# Patient Record
Sex: Female | Born: 1970 | Race: Black or African American | Hispanic: No | Marital: Married | Smoking: Current every day smoker
Health system: Southern US, Community
[De-identification: ages and names within clinical notes are randomized; demographics above are authoritative.]

## PROBLEM LIST (undated history)

## (undated) ENCOUNTER — Ambulatory Visit (HOSPITAL_COMMUNITY): Discharge status: Absent without leave | Payer: Self-pay

## (undated) DIAGNOSIS — Z8719 Personal history of other diseases of the digestive system: Secondary | ICD-10-CM

## (undated) DIAGNOSIS — F17200 Nicotine dependence, unspecified, uncomplicated: Secondary | ICD-10-CM

## (undated) DIAGNOSIS — Z8711 Personal history of peptic ulcer disease: Secondary | ICD-10-CM

## (undated) DIAGNOSIS — Z87440 Personal history of urinary (tract) infections: Secondary | ICD-10-CM

## (undated) DIAGNOSIS — G43909 Migraine, unspecified, not intractable, without status migrainosus: Secondary | ICD-10-CM

## (undated) DIAGNOSIS — Z8619 Personal history of other infectious and parasitic diseases: Secondary | ICD-10-CM

## (undated) DIAGNOSIS — M1990 Unspecified osteoarthritis, unspecified site: Secondary | ICD-10-CM

## (undated) DIAGNOSIS — Z86018 Personal history of other benign neoplasm: Secondary | ICD-10-CM

## (undated) DIAGNOSIS — G5601 Carpal tunnel syndrome, right upper limb: Secondary | ICD-10-CM

## (undated) DIAGNOSIS — T7840XA Allergy, unspecified, initial encounter: Secondary | ICD-10-CM

## (undated) DIAGNOSIS — Z860101 Personal history of adenomatous and serrated colon polyps: Secondary | ICD-10-CM

## (undated) DIAGNOSIS — Z86010 Personal history of colonic polyps: Secondary | ICD-10-CM

## (undated) DIAGNOSIS — K219 Gastro-esophageal reflux disease without esophagitis: Secondary | ICD-10-CM

## (undated) DIAGNOSIS — N6320 Unspecified lump in the left breast, unspecified quadrant: Secondary | ICD-10-CM

## (undated) DIAGNOSIS — M67431 Ganglion, right wrist: Secondary | ICD-10-CM

## (undated) HISTORY — DX: Personal history of peptic ulcer disease: Z87.11

## (undated) HISTORY — DX: Gastro-esophageal reflux disease without esophagitis: K21.9

## (undated) HISTORY — DX: Migraine, unspecified, not intractable, without status migrainosus: G43.909

## (undated) HISTORY — PX: COLONOSCOPY: SHX174

## (undated) HISTORY — DX: Allergy, unspecified, initial encounter: T78.40XA

## (undated) HISTORY — DX: Personal history of urinary (tract) infections: Z87.440

## (undated) HISTORY — PX: KNEE ARTHROSCOPY: SHX127

## (undated) HISTORY — DX: Nicotine dependence, unspecified, uncomplicated: F17.200

## (undated) HISTORY — DX: Personal history of other diseases of the digestive system: Z87.19

---

## 2004-10-04 HISTORY — PX: VAGINAL HYSTERECTOMY: SUR661

## 2014-07-03 ENCOUNTER — Encounter: Payer: Self-pay | Admitting: Medical

## 2014-07-03 ENCOUNTER — Ambulatory Visit (INDEPENDENT_AMBULATORY_CARE_PROVIDER_SITE_OTHER): Payer: BC Managed Care – PPO | Admitting: Medical

## 2014-07-03 VITALS — BP 92/60 | HR 92 | Temp 97.5°F | Resp 16 | Ht 62.4 in | Wt 173.0 lb

## 2014-07-03 DIAGNOSIS — R300 Dysuria: Secondary | ICD-10-CM

## 2014-07-03 DIAGNOSIS — K921 Melena: Secondary | ICD-10-CM

## 2014-07-03 DIAGNOSIS — R109 Unspecified abdominal pain: Secondary | ICD-10-CM

## 2014-07-03 DIAGNOSIS — F17200 Nicotine dependence, unspecified, uncomplicated: Secondary | ICD-10-CM

## 2014-07-03 DIAGNOSIS — Z862 Personal history of diseases of the blood and blood-forming organs and certain disorders involving the immune mechanism: Secondary | ICD-10-CM

## 2014-07-03 DIAGNOSIS — R198 Other specified symptoms and signs involving the digestive system and abdomen: Secondary | ICD-10-CM

## 2014-07-03 DIAGNOSIS — Z800 Family history of malignant neoplasm of digestive organs: Secondary | ICD-10-CM

## 2014-07-03 DIAGNOSIS — Z87898 Personal history of other specified conditions: Secondary | ICD-10-CM

## 2014-07-03 LAB — POCT URINALYSIS DIPSTICK
BILIRUBIN UA: NEGATIVE
Glucose, UA: NEGATIVE
KETONES UA: NEGATIVE
LEUKOCYTES UA: NEGATIVE
Nitrite, UA: NEGATIVE
Protein, UA: NEGATIVE
Spec Grav, UA: 1.015
Urobilinogen, UA: NEGATIVE
pH, UA: 5

## 2014-07-03 MED ORDER — HYDROCORTISONE ACETATE 25 MG RE SUPP: 25.0000 mg | Freq: Two times a day (BID) | RECTAL | Status: DC

## 2014-07-03 MED ORDER — OMEPRAZOLE 40 MG PO CPDR: 40.0000 mg | DELAYED_RELEASE_CAPSULE | Freq: Every day | ORAL | Status: DC

## 2014-07-03 NOTE — Progress Notes (Addendum)
Subjective: Here as a new patient today.   Moved from [place] recently.  She has family here, daughter, granddaughter.   Here accompanied by husband.     She is here for bowel changes, abdominal pain and blood in stool.   Prior to a month ago had "normal" BMs.  But in the last 2-3 weeks been having alternating hard stool, constipation at times, loose stool at times, and blood in stool about every other BM.   Blood is usually bright red, but at times dark red.   Is mixed in with stool.   This past week loose stools at least TID, but also having some hard to pass stool.  2 days ago had urge to defecate and had just straight blood that came out.   She has been using about 800mg  total daily Ibuprofen the last 2 -3 weeks for knee pains.  She is having lower abdominal pain.  No recent sick contacts, no fever, no recent travel.  No urinary c/o although she has hx/o frequent UTIs.    No back pain.  Denies current or hx/o hemorrhoids . Denies prior blood in stool.  Has had hx/o gastric ulcer, but no prior colonoscopy, EGD, or GI eval.   No nausea or vomiting.   No recent illness.  No recent antibiotic use.   She is not up to date on physical or cancer screens.  She is a smoker.   Review of Systems Constitutional: -fever, -chills, -sweats, -unexpected weight change,-fatigue ENT: -runny nose, -ear pain, -sore throat Cardiology:  -chest pain, -palpitations, -edema Respiratory: -cough, -shortness of breath, -wheezing Gastroenterology:+abdominal pain, -nausea, -vomiting, +diarrhea, +constipation Musculoskeletal: +arthralgias, -myalgias, -joint swelling, -back pain Ophthalmology: -vision changes Urology: +dysuria, -difficulty urinating, -hematuria, -urinary frequency, -urgency Neurology: -headache, -weakness, -tingling, -numbness   Past Medical History  Diagnosis Date  . History of frequent urinary tract infections   . Smoker   . Joint pain   . History of gastric ulcer     clinical diagnosis, no prior  EGD  . Migraines   . Asthma   . Anemia     since at least 2013  . GERD (gastroesophageal reflux disease)   . Allergy     Objective:  Filed Vitals:   07/03/14 1142  BP: 92/60  Pulse: 92  Temp:   Resp:     General appearance: alert, no distress, WD/WN, AA female Oral cavity: MMM, no lesions Neck: supple, no lymphadenopathy, no thyromegaly, no masses Heart: RRR, normal S1, S2, no murmurs Lungs: CTA bilaterally, no wheezes, rhonchi, or rales Abdomen: +bs, soft, tender throughout, non distended, no masses, no hepatomegaly, no splenomegaly Back: nontender Pulses: 2+ symmetric DRE: anus posteriorly with 51mm diameter area of erythema suggestive early hemorrhoid vs abrasion, no external lesions otherwise, palpable internal hemorrhoid, no other abnormality, normal rectal tone, occult negative stool, exam chaperoned by nurse Skin: right buttock along superior gluteal cleft with 8cm linear surgical scar from prior pilonidal cyst surgery   Assessment: Encounter Diagnoses  Name Primary?  . Abdominal pain, unspecified site Yes  . Blood in stool   . Smoker   . Change in bowel movement   . History of ulcer disease   . Dysuria   . Family history of stomach cancer   . History of anemia    Plan: Discussed symptoms, exam, concerns.  Differential is wide.  I suspect bleeding internal hemorrhoids brought on by recent constipation, and possible gastritis from NSAID use.   However, can't rule other worrisome  causes of bleeding.  Orthostatics not significant today.  Begin Omeprazole for GERD, possible ulcer, stop NSAIDs for now, begin proctosol for possible bleeding internal hemorrhoids, labs today, and consider imaging and GI referral.  Can use Tylenol for pain.  If pain worsens or other new symptoms in next 48 hours, call or return.

## 2014-07-04 LAB — COMPREHENSIVE METABOLIC PANEL
ALBUMIN: 4.3 g/dL (ref 3.5–5.2)
ALT: 9 U/L (ref 0–35)
AST: 17 U/L (ref 0–37)
Alkaline Phosphatase: 60 U/L (ref 39–117)
BUN: 10 mg/dL (ref 6–23)
CHLORIDE: 104 meq/L (ref 96–112)
CO2: 25 meq/L (ref 19–32)
CREATININE: 0.84 mg/dL (ref 0.50–1.10)
Calcium: 9.1 mg/dL (ref 8.4–10.5)
Glucose, Bld: 83 mg/dL (ref 70–99)
Potassium: 4.1 mEq/L (ref 3.5–5.3)
SODIUM: 137 meq/L (ref 135–145)
Total Bilirubin: 0.3 mg/dL (ref 0.2–1.2)
Total Protein: 7.5 g/dL (ref 6.0–8.3)

## 2014-07-04 LAB — CBC WITH DIFFERENTIAL/PLATELET
BASOS ABS: 0.1 10*3/uL (ref 0.0–0.1)
Basophils Relative: 1 % (ref 0–1)
Eosinophils Absolute: 0.1 10*3/uL (ref 0.0–0.7)
Eosinophils Relative: 2 % (ref 0–5)
HCT: 41.5 % (ref 36.0–46.0)
Hemoglobin: 14.1 g/dL (ref 12.0–15.0)
LYMPHS PCT: 44 % (ref 12–46)
Lymphs Abs: 3.1 10*3/uL (ref 0.7–4.0)
MCH: 31.3 pg (ref 26.0–34.0)
MCHC: 34 g/dL (ref 30.0–36.0)
MCV: 92.2 fL (ref 78.0–100.0)
Monocytes Absolute: 0.4 10*3/uL (ref 0.1–1.0)
Monocytes Relative: 6 % (ref 3–12)
Neutro Abs: 3.3 10*3/uL (ref 1.7–7.7)
Neutrophils Relative %: 47 % (ref 43–77)
PLATELETS: 247 10*3/uL (ref 150–400)
RBC: 4.5 MIL/uL (ref 3.87–5.11)
RDW: 14.4 % (ref 11.5–15.5)
WBC: 7.1 10*3/uL (ref 4.0–10.5)

## 2014-07-04 LAB — LIPASE: Lipase: 43 U/L (ref 0–75)

## 2014-07-04 LAB — IRON AND TIBC
%SAT: 25 % (ref 20–55)
IRON: 80 ug/dL (ref 42–145)
TIBC: 324 ug/dL (ref 250–470)
UIBC: 244 ug/dL (ref 125–400)

## 2014-07-08 ENCOUNTER — Other Ambulatory Visit: Payer: Self-pay | Admitting: Medical

## 2014-07-08 ENCOUNTER — Telehealth: Payer: Self-pay | Admitting: Family Medicine

## 2014-07-08 MED ORDER — HYDROCORTISONE 2.5 % RE CREA: 1.0000 "application " | TOPICAL_CREAM | Freq: Two times a day (BID) | RECTAL | Status: DC

## 2014-07-08 NOTE — Telephone Encounter (Signed)
Patient is aware of [facility] message. CLS

## 2014-07-08 NOTE — Telephone Encounter (Signed)
Patient states that both of the medication's you prescribe her cost way to much money. One medication was $160 and the other was $80 dollars. Can you recommend something else for her to use.

## 2014-07-08 NOTE — Telephone Encounter (Signed)
Surprisingly both of those medications, although generic were expensive.  After calling pharmacy, lets try this:  I have Dexilant samples once daily for epigastric pain and acid reflux.  She can either use this or 2 OTC Omeprazole daily.  Regarding suppository, all are expensive so have her try Hydrocortisone rectal cream topically 2.5% daily or OTC hydrocortisone topically along with salt water soaks

## 2014-08-22 ENCOUNTER — Emergency Department (HOSPITAL_COMMUNITY)
Admission: EM | Admit: 2014-08-22 | Discharge: 2014-08-22 | Disposition: A | Discharge status: Home / Self Care | Payer: BC Managed Care – PPO | Attending: Emergency Medicine | Admitting: Emergency Medicine

## 2014-08-22 ENCOUNTER — Emergency Department (HOSPITAL_COMMUNITY): Payer: BC Managed Care – PPO

## 2014-08-22 DIAGNOSIS — J45909 Unspecified asthma, uncomplicated: Secondary | ICD-10-CM | POA: Diagnosis not present

## 2014-08-22 DIAGNOSIS — T1490XA Injury, unspecified, initial encounter: Secondary | ICD-10-CM

## 2014-08-22 DIAGNOSIS — Z720 Tobacco use: Secondary | ICD-10-CM | POA: Insufficient documentation

## 2014-08-22 DIAGNOSIS — Z87440 Personal history of urinary (tract) infections: Secondary | ICD-10-CM | POA: Insufficient documentation

## 2014-08-22 DIAGNOSIS — Z7952 Long term (current) use of systemic steroids: Secondary | ICD-10-CM | POA: Diagnosis not present

## 2014-08-22 DIAGNOSIS — Y9389 Activity, other specified: Secondary | ICD-10-CM | POA: Diagnosis not present

## 2014-08-22 DIAGNOSIS — Z8669 Personal history of other diseases of the nervous system and sense organs: Secondary | ICD-10-CM | POA: Insufficient documentation

## 2014-08-22 DIAGNOSIS — M67432 Ganglion, left wrist: Secondary | ICD-10-CM | POA: Diagnosis not present

## 2014-08-22 DIAGNOSIS — Y9289 Other specified places as the place of occurrence of the external cause: Secondary | ICD-10-CM | POA: Diagnosis not present

## 2014-08-22 DIAGNOSIS — M67431 Ganglion, right wrist: Secondary | ICD-10-CM

## 2014-08-22 DIAGNOSIS — K219 Gastro-esophageal reflux disease without esophagitis: Secondary | ICD-10-CM | POA: Insufficient documentation

## 2014-08-22 DIAGNOSIS — Y990 Civilian activity done for income or pay: Secondary | ICD-10-CM | POA: Diagnosis not present

## 2014-08-22 DIAGNOSIS — W208XXA Other cause of strike by thrown, projected or falling object, initial encounter: Secondary | ICD-10-CM | POA: Diagnosis not present

## 2014-08-22 DIAGNOSIS — Z8711 Personal history of peptic ulcer disease: Secondary | ICD-10-CM | POA: Insufficient documentation

## 2014-08-22 DIAGNOSIS — S199XXA Unspecified injury of neck, initial encounter: Secondary | ICD-10-CM | POA: Insufficient documentation

## 2014-08-22 MED ORDER — IBUPROFEN 400 MG PO TABS
400.0000 mg | ORAL_TABLET | Freq: Once | ORAL | Status: AC
  Administered 2014-08-22: 400 mg via ORAL
  Filled 2014-08-22: qty 1

## 2014-08-22 MED ORDER — IBUPROFEN 100 MG/5ML PO SUSP: 10.0000 mg/kg | Freq: Once | ORAL | Status: DC

## 2014-08-22 MED ORDER — TRAMADOL HCL 50 MG PO TABS: 50.0000 mg | ORAL_TABLET | Freq: Four times a day (QID) | ORAL | Status: DC | PRN

## 2014-08-22 NOTE — ED Notes (Signed)
1- c/o right wrist pain with lump on anterior aspect. Also c/o left wrist pain. No deformity to left. 2- c/o pain right trapezius area after having fry basket fall on her at work.

## 2014-08-22 NOTE — Discharge Instructions (Signed)
For pain control you may take:  800mg  of ibuprofen (that is usually 4 over the counter pills)  3 times a day (take with food) and acetaminophen 975mg  (this is 3 over the counter pills) four times a day. Do not drink alcohol or combine with other medications that have acetaminophen as an ingredient (Read the labels!).  For breakthrough pain you may take Tramadol. Do not drink alcohol drive or operate heavy machinery when taking Tramadol.  Do not hesitate to return to the emergency room for any new, worsening or concerning symptoms.  Please obtain primary care using resource guide below. But the minute you were seen in the emergency room and that they will need to obtain records for further outpatient management.    Ganglion Cyst A ganglion cyst is a noncancerous, fluid-filled lump that occurs near joints or tendons. The ganglion cyst grows out of a joint or the lining of a tendon. It most often develops in the hand or wrist but can also develop in the shoulder, elbow, hip, knee, ankle, or foot. The round or oval ganglion can be pea sized or larger than a grape. Increased activity may enlarge the size of the cyst because more fluid starts to build up.  CAUSES  It is not completely known what causes a ganglion cyst to grow. However, it may be related to:  Inflammation or irritation around the joint.  An injury.  Repetitive movements or overuse.  Arthritis. SYMPTOMS  A lump most often appears in the hand or wrist, but can occur in other areas of the body. Generally, the lump is painless without other symptoms. However, sometimes pain can be felt during activity or when pressure is applied to the lump. The lump may even be tender to the touch. Tingling, pain, numbness, or muscle weakness can occur if the ganglion cyst presses on a nerve. Your grip may be weak and you may have less movement in your joints.  DIAGNOSIS  Ganglion cysts are most often diagnosed based on a physical exam, noting where the  cyst is and how it looks. Your caregiver will feel the lump and may shine a light alongside it. If it is a ganglion, a light often shines through it. Your caregiver may order an X-ray, ultrasound, or MRI to rule out other conditions. TREATMENT  Ganglions usually go away on their own without treatment. If pain or other symptoms are involved, treatment may be needed. Treatment is also needed if the ganglion limits your movement or if it gets infected. Treatment options include:  Wearing a wrist or finger brace or splint.  Taking anti-inflammatory medicine.  Draining fluid from the lump with a needle (aspiration).  Injecting a steroid into the joint.  Surgery to remove the ganglion cyst and its stalk that is attached to the joint or tendon. However, ganglion cysts can grow back. HOME CARE INSTRUCTIONS   Do not press on the ganglion, poke it with a needle, or hit it with a heavy object. You may rub the lump gently and often. Sometimes fluid moves out of the cyst.  Only take medicines as directed by your caregiver.  Wear your brace or splint as directed by your caregiver. SEEK MEDICAL CARE IF:   Your ganglion becomes larger or more painful.  You have increased redness, red streaks, or swelling.  You have pus coming from the lump.  You have weakness or numbness in the affected area. MAKE SURE YOU:   Understand these instructions.  Will watch your condition.  Will get help right away if you are not doing well or get worse. Document Released: 09/17/2000 Document Revised: 06/14/2012 Document Reviewed: 11/14/2007 [facility] Patient Information 2015 [place], [place]. This information is not intended to replace advice given to you by your health care provider. Make sure you discuss any questions you have with your health care provider.   Emergency Department Resource Guide 1) Find a Doctor and Pay Out of Pocket Although you won't have to find out who is covered by your insurance plan, it  is a good idea to ask around and get recommendations. You will then need to call the office and see if the doctor you have chosen will accept you as a new patient and what types of options they offer for patients who are self-pay. Some doctors offer discounts or will set up payment plans for their patients who do not have insurance, but you will need to ask so you aren't surprised when you get to your appointment.  2) Contact Your Local Health Department Not all health departments have doctors that can see patients for sick visits, but many do, so it is worth a call to see if yours does. If you don't know where your local health department is, you can check in your phone book. The CDC also has a tool to help you locate your state's health department, and many state websites also have listings of all of their local health departments.  3) Find a [facility] If your illness is not likely to be very severe or complicated, you may want to try a walk in clinic. These are popping up all over the country in pharmacies, drugstores, and shopping centers. They're usually staffed by nurse practitioners or physician assistants that have been trained to treat common illnesses and complaints. They're usually fairly quick and inexpensive. However, if you have serious medical issues or chronic medical problems, these are probably not your best option.  No Primary Care Doctor: - Call Health Connect at  [phone] - they can help you locate a primary care doctor that  accepts your insurance, provides certain services, etc. - Physician Referral Service- [phone]  Chronic Pain Problems: Organization         Address  Phone   Notes  [facility]  [phone] Patients need to be referred by their primary care doctor.   Medication Assistance: Organization         Address  Phone   Notes  [facility] Medication [facility] [address] [place], [place] [address] [phone] --Must be a resident of [facility] -- Must have NO insurance coverage whatsoever (no Medicaid/ Medicare, etc.) -- The pt. MUST have a primary care doctor that directs their care regularly and follows them in the community   MedAssist  [phone]   [employer]  [phone]    Agencies that provide inexpensive medical care: Organization         Address  Phone   Notes  [place]  [phone]   [facility]    [phone]   [facility] [place], [place] [address] [phone]   [place] [address], [place] [phone]   Planned Parenthood    [phone]   [facility]    [phone]   [place] and [place], [place] Phone:  [phone],  Fax:  [phone] Hours of Operation:  9 am - 6 pm, M-F.  Also accepts Medicaid/Medicare and self-pay.  [facility] for [place], [address], [place] Phone: [phone], Fax: [phone]. Hours of Operation:  8:30 am - 5:30 pm, M-F.  Also accepts Medicaid and self-pay.  [facility] [place] [address], [place] Phone: [phone]   [place], [place], [place] [phone], Ext. [phone] Mondays & Thursdays: 7-9 AM.  First 15 patients are seen on a first come, first serve basis.    [place] Providers:  Organization         Address  Phone   Notes  [facility] [address], [place] [phone] Also accepts self-pay patients.  [facility] [address], [place]  [phone]   [place], [address], [place] [phone]   [facility] [address] [place] [phone]   Lucianne Lei [address], [place]   [phone] Only accepts [place] Access [place] patients after they have their name applied to their card.   Self-Pay (no insurance) in [facility]:  Organization         Address  Phone   Notes  Sickle Cell Patients, [facility] [place] [phone]   [facility] [place] [phone]   [facility] [place]  [place], [place], [place] [phone]   [facility]/Dr. Osei-Bonsu  [address], [place] or [address], [place] [phone] Phone number for both [place] and [place] locations is the same.  Urgent Medical and [facility] [address] [place] [phone]   [facility] [address], [place] or [address] [phone]   [facility] [address], [place] [phone], phone; [phone], fax Sees patients 1st and 3rd Saturday of every month.  Must not qualify for public or private insurance (i.e. Medicaid, Medicare, [place] Health Choice, Veterans' Benefits)  Household income should be no more than 200% of the poverty level The clinic cannot treat you if you are pregnant or think you are pregnant  Sexually transmitted diseases are not treated at the clinic.    Dental Care: Organization         Address  Phone  Notes  [facility] Department of [facility] [phone] Accepts children up to age 17 who are enrolled in [place] or [place]; pregnant women with a Medicaid card; and children who have applied for Medicaid or [place] Health Choice, but were declined, whose parents can pay a reduced fee at time of service.  [facility] Department of [facility]  [address], [place] [phone] Accepts children up to age 68 who are enrolled in [place] or [place]; pregnant women with a Medicaid card; and children who have applied for Medicaid or [place] Health Choice, but were declined, whose parents can pay a reduced fee at time of service.  [facility] Adult [facility] Access PRO[facility]  [place] [phone] Patients are seen by appointment only. Walk-ins are not  accepted. [place] will see patients 34 years of age and older. Monday - Tuesday (8am-5pm) Most Wednesdays (8:30-5pm) $30 per visit, cash only  [facility] Adult Dental Access PROGRAM  [address], [facility] [phone] Patients are seen by appointment only. Walk-ins are not accepted. [place] will see patients 25 years of age and older. One Wednesday Evening (Monthly: Volunteer Based).  $30 per visit, cash only  [place]  [phone] for adults; Children under age 24, call Graduate Pediatric Dentistry at [phone]. Children aged 10-14, please call [phone] to request a pediatric application.  Dental services are provided in all areas of dental care including fillings, crowns and bridges, complete and partial dentures, implants, gum treatment, root canals, and extractions. Preventive care is also provided. Treatment is provided to both adults and children. Patients are selected via a lottery and there is often a waiting list.   [facility] [address] [place]  [phone] [email or web address]   [facility] [address] [place], [place] [phone], Ext. [phone] Second and Fourth Thursday of each month, opens at 6:30 AM; Clinic ends at 9 AM.  Patients are seen on a first-come first-served basis, and a limited number are seen during each clinic.   [facility]  [address] Hillard Danker [place], [place] [phone]   Eligibility Requirements You must have lived in [place], [place], or [place] counties for at least the last three months.   You cannot be eligible for state or  federal sponsored [employer], including [employer], [place], or [employer].   You generally cannot be eligible for healthcare insurance through your employer.    How to apply: Eligibility screenings are held every Tuesday and Wednesday afternoon from 1:00 pm until 4:00 pm. You do not need an appointment for the interview!  [facility] [address] [place], [place]   [place]  [place] Department  [place]  [phone]    Behavioral Health Resources in the Community: Intensive Outpatient Programs Organization         Address  Phone  Notes  [place] [facility] [place]. [address] [place], [place] [phone]   [facility] [address] [place], [place]   ADS: Alcohol & Drug Svcs [address] [place], [place]   [place] [address],  [place], [place] or [phone]   Substance Abuse Resources Organization         Address  Phone  Notes  Alcohol and Drug Services  [phone]   [place]  [phone]   [facility]   Chinita Pester  [phone]   Residential & Outpatient Substance Abuse Program  [phone]   Psychological Services Organization         Address  Phone  Notes  [facility]  [place]  [phone]   [place] [address], [place] or [phone]    Mobile Crisis Teams Organization         Address  Phone  Notes  [facility]rapeutic Alternatives, Mobile Crisis Care Unit  [phone]   Assertive Psychotherapeutic Services  [address] [place], [place]   [facility] [address] [place] [phone]    Self-Help/Support Groups Organization  Address  Phone              Notes  [place]. of [place] - variety of support groups  [place] Call for more information  Narcotics Anonymous (NA), [facility] [address], [employer] [place]  2 meetings at this location   Special educational needs teacher         Address  Phone  Notes  [facility] Residential Treatment [place],    [place]  [phone]   [facility]  [address] [place] [address], [place], [place]   [place], [place] [phone] Admissions: 8am-3pm M-F  Incentives Substance [place] [address],    [place], [place] [phone]   [facility] [address] [place], [place], [place]   [facility] [address],  [place], [place]   Insight Programs - Intensive Outpatient [address], [place], [place]   [facility] ([place].,  [place], [place] [phone] or [phone]   [facility] ([facility]) [address], [place], [place] Accepts Medicaid  [facility] [place] [address],  [place] [phone] Substance Abuse/Addiction Treatment   [facility] Organization         Address  Phone  Notes  [facility]  [phone]   Domenic Schwab, PhD [address] Arlis Porta [place], [place]   [phone] or [phone]   Petrey [place], [place] [phone]   [facility] [address] [place], [place] [phone] Insurance/Medicaid/sponsorship through [facility] and Families [address] [place]                                    [place], [place] [phone] [place] [address], [place] [phone]    Dr. Adele Schilder  [phone]   [facility]  Dept. 1) [address] [place] 2) [place] 3)  [place] [address], [place] [phone]  [phone]   [place] [phone] or [phone] (After Hours)

## 2014-08-22 NOTE — ED Provider Notes (Signed)
CSN: [id number]     Arrival date & time 08/22/14  8850 History  This chart was scribed for non-physician practitioner, Monico Blitz, PA-C, working with Quintella Reichert, MD, by Stephania Fragmin, ED Scribe. This patient was seen in room TR05C/TR05C and the patient's care was started at 9:36 AM.   Chief Complaint  Patient presents with  . Neck Injury  . Wrist Pain    Patient is a 43 y.o. female presenting with wrist pain. The history is provided by the patient. No language interpreter was used.  Wrist Pain    HPI Comments: April Hudson is a 43 y.o. female who presents to the Emergency Department complaining of 10/10 right wrist pain that "feels like a knot," 8/10, sore left wrist pain, and pain at the back of her neck that began 2 weeks ago when she had dropped a fry basket on it at work. She tried Tylenol, which didn't help. She can't recall her PCP.    Past Medical History  Diagnosis Date  . History of frequent urinary tract infections   . Smoker   . Joint pain   . History of gastric ulcer     clinical diagnosis, no prior EGD  . Migraines   . Asthma   . Anemia     since at least 2013  . GERD (gastroesophageal reflux disease)   . Allergy    No past surgical history on file. Family History  Problem Relation Age of Onset  . Diabetes Mother   . Hypertension Mother   . Cancer Maternal Uncle     stomach   History  Substance Use Topics  . Smoking status: Current Every Day Smoker  . Smokeless tobacco: Not on file  . Alcohol Use: No   OB History    No data available     Review of Systems  All other systems reviewed and are negative.   A complete 10 system review of systems was obtained and all systems are negative except as noted in the HPI and PMH.    Allergies  Sulfa antibiotics  Home Medications   Prior to Admission medications   Medication Sig Start Date End Date Taking? Authorizing Provider  acetaminophen (TYLENOL) 500 MG tablet Take 1,000 mg by mouth every 8  (eight) hours as needed for moderate pain.   Yes Historical Provider, MD  hydrocortisone (ANUSOL-HC) 2.5 % rectal cream Place 1 application rectally 2 (two) times daily. Patient not taking: Reported on 08/22/2014 07/08/14   Camelia Eng Tysinger, PA-C  hydrocortisone (ANUSOL-HC) 25 MG suppository Place 1 suppository (25 mg total) rectally 2 (two) times daily. Patient not taking: Reported on 08/22/2014 07/03/14   Camelia Eng Tysinger, PA-C  omeprazole (PRILOSEC) 40 MG capsule Take 1 capsule (40 mg total) by mouth daily. Patient not taking: Reported on 08/22/2014 07/03/14   Camelia Eng Tysinger, PA-C  traMADol (ULTRAM) 50 MG tablet Take 1 tablet (50 mg total) by mouth every 6 (six) hours as needed. 08/22/14   Laurajean Hosek, PA-C   BP 101/74 mmHg  Pulse 78  Temp(Src) 98.4 F (36.9 C) (Oral)  Resp 20  SpO2 100% Physical Exam  Constitutional: She is oriented to person, place, and time. She appears well-developed and well-nourished. No distress.  HENT:  Head: Normocephalic and atraumatic.  Mouth/Throat: Oropharynx is clear and moist.  Eyes: Conjunctivae and EOM are normal.  Neck: Normal range of motion. Neck supple.  Positive lower midline tenderness palpation.  tenderness to palpation or step-offs appreciated. Patient has full range  of motion without pain.   Cardiovascular: Normal rate and intact distal pulses.   Pulmonary/Chest: Effort normal and breath sounds normal. No stridor.  Abdominal: Soft. Bowel sounds are normal.  Musculoskeletal: Normal range of motion.  Right volar subcutaneous nodule minimally mobile, diffusely tender to palpation. No overlying skin changes. Full range of motion to wrist, distally neurovascular intact.  Neurological: She is alert and oriented to person, place, and time.  Psychiatric: She has a normal mood and affect.  Nursing note and vitals reviewed.   ED Course  Procedures (including critical care time)  DIAGNOSTIC STUDIES: Oxygen Saturation is 100% on room air,  normal by my interpretation.    COORDINATION OF CARE: 9:37 AM - Discussed treatment plan with pt at bedside which includes XR's and pt agreed to plan.  Labs Review Labs Reviewed - No data to display  Imaging Review Dg Cervical Spine Complete  08/22/2014   CLINICAL DATA:  Neck trauma.  Neck pain.  C7 pain.  EXAM: CERVICAL SPINE  4+ VIEWS  COMPARISON:  None.  FINDINGS: There is no evidence of cervical spine fracture or prevertebral soft tissue swelling. Alignment is normal. No other significant bone abnormalities are identified.  IMPRESSION: Negative cervical spine radiographs.   Electronically Signed   By: Dereck Ligas M.D.   On: 08/22/2014 11:34     EKG Interpretation None      MDM   Final diagnoses:  Trauma  Ganglion cyst of both wrists    Filed Vitals:   08/22/14 0912  BP: 101/74  Pulse: 78  Temp: 98.4 F (36.9 C)  TempSrc: Oral  Resp: 20  SpO2: 100%    Medications  ibuprofen (ADVIL,MOTRIN) tablet 400 mg (400 mg Oral Given 08/22/14 0950)    April Hudson is a 43 y.o. female presenting with neck pain after a fry hit her spinous process of lower C-spine last week. No step-offs appreciated. X-ray without abnormality. Patient also reports pain to bilateral wrist there is a cyst consistent with a ganglion cyst bilaterally. Patient will be referred to hand orthopedics for definitive management. Return precautions are discussed. Pain control given.  Evaluation does not show pathology that would require ongoing emergent intervention or inpatient treatment. Pt is hemodynamically stable and mentating appropriately. Discussed findings and plan with patient/guardian, who agrees with care plan. All questions answered. Return precautions discussed and outpatient follow up given.   Discharge Medication List as of 08/22/2014 11:39 AM    START taking these medications   Details  traMADol (ULTRAM) 50 MG tablet Take 1 tablet (50 mg total) by mouth every 6 (six) hours as needed.,  Starting 08/22/2014, Until Discontinued, Print         I personally performed the services described in this documentation, which was scribed in my presence. The recorded information has been reviewed and is accurate.   Monico Blitz, PA-C 08/22/14 [place], MD 08/22/14 [phone]

## 2014-08-23 ENCOUNTER — Encounter (HOSPITAL_BASED_OUTPATIENT_CLINIC_OR_DEPARTMENT_OTHER): Payer: Self-pay | Admitting: Emergency Medicine

## 2014-09-06 ENCOUNTER — Emergency Department (HOSPITAL_COMMUNITY)
Admission: EM | Admit: 2014-09-06 | Discharge: 2014-09-06 | Disposition: A | Discharge status: Home / Self Care | Payer: BC Managed Care – PPO | Attending: Emergency Medicine | Admitting: Emergency Medicine

## 2014-09-06 ENCOUNTER — Encounter (HOSPITAL_COMMUNITY): Payer: Self-pay | Admitting: Emergency Medicine

## 2014-09-06 DIAGNOSIS — Z862 Personal history of diseases of the blood and blood-forming organs and certain disorders involving the immune mechanism: Secondary | ICD-10-CM | POA: Diagnosis not present

## 2014-09-06 DIAGNOSIS — Z79899 Other long term (current) drug therapy: Secondary | ICD-10-CM | POA: Diagnosis not present

## 2014-09-06 DIAGNOSIS — G5602 Carpal tunnel syndrome, left upper limb: Secondary | ICD-10-CM | POA: Diagnosis not present

## 2014-09-06 DIAGNOSIS — K219 Gastro-esophageal reflux disease without esophagitis: Secondary | ICD-10-CM | POA: Diagnosis not present

## 2014-09-06 DIAGNOSIS — Z7952 Long term (current) use of systemic steroids: Secondary | ICD-10-CM | POA: Insufficient documentation

## 2014-09-06 DIAGNOSIS — K259 Gastric ulcer, unspecified as acute or chronic, without hemorrhage or perforation: Secondary | ICD-10-CM | POA: Diagnosis not present

## 2014-09-06 DIAGNOSIS — G5601 Carpal tunnel syndrome, right upper limb: Secondary | ICD-10-CM

## 2014-09-06 DIAGNOSIS — Z8679 Personal history of other diseases of the circulatory system: Secondary | ICD-10-CM | POA: Insufficient documentation

## 2014-09-06 DIAGNOSIS — M67431 Ganglion, right wrist: Secondary | ICD-10-CM | POA: Diagnosis present

## 2014-09-06 DIAGNOSIS — J45909 Unspecified asthma, uncomplicated: Secondary | ICD-10-CM | POA: Diagnosis not present

## 2014-09-06 DIAGNOSIS — Z720 Tobacco use: Secondary | ICD-10-CM | POA: Diagnosis not present

## 2014-09-06 DIAGNOSIS — Z87440 Personal history of urinary (tract) infections: Secondary | ICD-10-CM | POA: Insufficient documentation

## 2014-09-06 MED ORDER — OMEPRAZOLE 40 MG PO CPDR: 40.0000 mg | DELAYED_RELEASE_CAPSULE | Freq: Every day | ORAL | Status: DC

## 2014-09-06 MED ORDER — NAPROXEN 500 MG PO TABS: 500.0000 mg | ORAL_TABLET | Freq: Two times a day (BID) | ORAL | Status: DC

## 2014-09-06 MED ORDER — OXYCODONE-ACETAMINOPHEN 5-325 MG PO TABS: 1.0000 | ORAL_TABLET | ORAL | Status: DC | PRN

## 2014-09-06 NOTE — Discharge Instructions (Signed)
Carpal Tunnel Syndrome The carpal tunnel is a narrow area located on the palm side of your wrist. The tunnel is formed by the wrist bones and ligaments. Nerves, blood vessels, and tendons pass through the carpal tunnel. Repeated wrist motion or certain diseases may cause swelling within the tunnel. This swelling pinches the main nerve in the wrist (median nerve) and causes the painful hand and arm condition called carpal tunnel syndrome. CAUSES   Repeated wrist motions.  Wrist injuries.  Certain diseases like arthritis, diabetes, alcoholism, hyperthyroidism, and kidney failure.  Obesity.  Pregnancy. SYMPTOMS   A "pins and needles" feeling in your fingers or hand, especially in your thumb, index and middle fingers.  Tingling or numbness in your fingers or hand.  An aching feeling in your entire arm, especially when your wrist and elbow are bent for long periods of time.  Wrist pain that goes up your arm to your shoulder.  Pain that goes down into your palm or fingers.  A weak feeling in your hands. DIAGNOSIS  Your health care provider will take your history and perform a physical exam. An electromyography test may be needed. This test measures electrical signals sent out by your nerves into the muscles. The electrical signals are usually slowed by carpal tunnel syndrome. You may also need X-rays. TREATMENT  Carpal tunnel syndrome may clear up by itself. Your health care provider may recommend a wrist splint or medicine such as a nonsteroidal anti-inflammatory medicine. Cortisone injections may help. Sometimes, surgery may be needed to free the pinched nerve.  HOME CARE INSTRUCTIONS   Take all medicine as directed by your health care provider. Only take over-the-counter or prescription medicines for pain, discomfort, or fever as directed by your health care provider.  If you were given a splint to keep your wrist from bending, wear it as directed. It is important to wear the splint at  night. Wear the splint for as long as you have pain or numbness in your hand, arm, or wrist. This may take 1 to 2 months.  Rest your wrist from any activity that may be causing your pain. If your symptoms are work-related, you may need to talk to your employer about changing to a job that does not require using your wrist.  Put ice on your wrist after long periods of wrist activity.  Put ice in a plastic bag.  Place a towel between your skin and the bag.  Leave the ice on for 15-20 minutes, 03-04 times a day.  Keep all follow-up visits as directed by your health care provider. This includes any orthopedic referrals, physical therapy, and rehabilitation. Any delay in getting necessary care could result in a delay or failure of your condition to heal. SEEK IMMEDIATE MEDICAL CARE IF:   You have new, unexplained symptoms.  Your symptoms get worse and are not helped or controlled with medicines. MAKE SURE YOU:   Understand these instructions.  Will watch your condition.  Will get help right away if you are not doing well or get worse. Document Released: 09/17/2000 Document Revised: 02/04/2014 Document Reviewed: 08/06/2011 [employer] Patient Information 2015 [employer]. This information is not intended to replace advice given to you by your health care provider. Make sure you discuss any questions you have with your health care provider.  

## 2014-09-06 NOTE — ED Provider Notes (Signed)
CSN: [id number]     Arrival date & time 09/06/14  0917 History  This chart was scribed for Margarita Mail, PA-C, working with Orlie Dakin, MD by Steva Colder, ED Scribe. The patient was seen in room TR08C/TR08C at 9:30 AM.    Chief Complaint  Patient presents with  . Ganglion Cyst     The history is provided by the patient. No language interpreter was used.   HPI Comments: April Hudson is a 43 y.o. female with a hx of joint pain, and ganglion cyst who presents to the Emergency Department complaining of ganglion cyst. The pain is constant and it radiates up her arm. Her pain is worse at night and her hands go to sleep. When she wakes up at night she bangs her wrists together to get them to "wake up". 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 She states that she is having associated symptoms of joint swelling, arthlagia and myalgia of the right wrist.  She denies any other associated symptoms. She reports working at [employer]. She voices concerns for her job if she can change   Past Medical History  Diagnosis Date  . History of frequent urinary tract infections   . Smoker   . Joint pain   . History of gastric ulcer     clinical diagnosis, no prior EGD  . Migraines   . Asthma   . Anemia     since at least 2013  . GERD (gastroesophageal reflux disease)   . Allergy    History reviewed. No pertinent past surgical history. Family History  Problem Relation Age of Onset  . Diabetes Mother   . Hypertension Mother   . Cancer Maternal Uncle     stomach   History  Substance Use Topics  . Smoking status: Current Every Day Smoker  . Smokeless tobacco: Not on file  . Alcohol Use: No   OB History    No data available     Review of Systems  Musculoskeletal: Positive for myalgias, joint swelling and arthralgias.      Allergies  Sulfa antibiotics  Home Medications   Prior to Admission medications   Medication Sig Start Date End Date Taking?  Authorizing Provider  acetaminophen (TYLENOL) 500 MG tablet Take 1,000 mg by mouth every 8 (eight) hours as needed for moderate pain.    Historical Provider, MD  hydrocortisone (ANUSOL-HC) 2.5 % rectal cream Place 1 application rectally 2 (two) times daily. Patient not taking: Reported on 08/22/2014 07/08/14   Camelia Eng Tysinger, PA-C  hydrocortisone (ANUSOL-HC) 25 MG suppository Place 1 suppository (25 mg total) rectally 2 (two) times daily. Patient not taking: Reported on 08/22/2014 07/03/14   Camelia Eng Tysinger, PA-C  omeprazole (PRILOSEC) 40 MG capsule Take 1 capsule (40 mg total) by mouth daily. Patient not taking: Reported on 08/22/2014 07/03/14   Camelia Eng Tysinger, PA-C  traMADol (ULTRAM) 50 MG tablet Take 1 tablet (50 mg total) by mouth every 6 (six) hours as needed. 08/22/14   Nicole Pisciotta, PA-C   BP 117/76 mmHg  Pulse 83  Temp(Src) 97.9 F (36.6 C) (Oral)  Resp 18  Ht 5\' 1"  (1.549 m)  Wt 175 lb (79.379 kg)  BMI 33.08 kg/m2  SpO2 98%  Physical Exam  Constitutional: She is oriented to person, place, and time. She appears well-developed and well-nourished. No distress.  HENT:  Head: Normocephalic and atraumatic.  Eyes: EOM are normal.  Neck: Neck supple. No tracheal deviation  present.  Cardiovascular: Normal rate.   Pulmonary/Chest: Effort normal. No respiratory distress.  Musculoskeletal: Normal range of motion.       Right wrist: She exhibits tenderness and swelling.       Arms: Positive tinels sign. Diffused Swelling of the right wrist and forearm and a ganglion cyst present on the flexer carpe radialis tendon. Full ROM and strength with severe pain.   Neurological: She is alert and oriented to person, place, and time.  Skin: Skin is warm and dry.  Psychiatric: She has a normal mood and affect. Her behavior is normal.  Nursing note and vitals reviewed.   ED Course  Procedures (including critical care time) DIAGNOSTIC STUDIES: Oxygen Saturation is 98% on room air, normal  by my interpretation.    COORDINATION OF CARE: 9:34 AM-Discussed treatment plan which includes anti-inflammatory medications, wrist brace, pain medication, and work note with pt at bedside and pt agreed to plan.   Labs Review Labs Reviewed - No data to display  Imaging Review No results found.   EKG Interpretation None      MDM   Final diagnoses:  Carpal tunnel syndrome of right wrist   Patient with ganglion cyst, however given her occupation and sxs  With + Tinnel's I believe sxs are more consistent with dx of carpal tunnel syndrome. Patient will be discharge with brace, rice, pain meds, antiinflammatories and f/u with ortho  I personally performed the services described in this documentation, which was scribed in my presence. The recorded information has been reviewed and is accurate.    Margarita Mail, PA-C 09/07/14 [place], MD 09/07/14 [phone]

## 2014-09-06 NOTE — ED Notes (Signed)
Patient states she was diagnosed with ganglion cyst a while back.  Patient states she is now having pain in arm and hands.   Patient states she does have some numbness in hand at times.  Patient was supposed to follow up with ortho, but didn't have monies.

## 2014-11-30 ENCOUNTER — Encounter (HOSPITAL_COMMUNITY): Payer: Self-pay | Admitting: *Deleted

## 2014-11-30 ENCOUNTER — Emergency Department (HOSPITAL_COMMUNITY)
Admission: EM | Admit: 2014-11-30 | Discharge: 2014-11-30 | Disposition: A | Discharge status: Home / Self Care | Payer: BLUE CROSS/BLUE SHIELD | Attending: Emergency Medicine | Admitting: Emergency Medicine

## 2014-11-30 DIAGNOSIS — K219 Gastro-esophageal reflux disease without esophagitis: Secondary | ICD-10-CM | POA: Insufficient documentation

## 2014-11-30 DIAGNOSIS — Z79899 Other long term (current) drug therapy: Secondary | ICD-10-CM | POA: Insufficient documentation

## 2014-11-30 DIAGNOSIS — Z791 Long term (current) use of non-steroidal anti-inflammatories (NSAID): Secondary | ICD-10-CM | POA: Insufficient documentation

## 2014-11-30 DIAGNOSIS — Z862 Personal history of diseases of the blood and blood-forming organs and certain disorders involving the immune mechanism: Secondary | ICD-10-CM | POA: Insufficient documentation

## 2014-11-30 DIAGNOSIS — Z720 Tobacco use: Secondary | ICD-10-CM | POA: Insufficient documentation

## 2014-11-30 DIAGNOSIS — J45909 Unspecified asthma, uncomplicated: Secondary | ICD-10-CM | POA: Insufficient documentation

## 2014-11-30 DIAGNOSIS — Z87440 Personal history of urinary (tract) infections: Secondary | ICD-10-CM | POA: Insufficient documentation

## 2014-11-30 DIAGNOSIS — G5601 Carpal tunnel syndrome, right upper limb: Secondary | ICD-10-CM | POA: Insufficient documentation

## 2014-11-30 DIAGNOSIS — Z7952 Long term (current) use of systemic steroids: Secondary | ICD-10-CM | POA: Insufficient documentation

## 2014-11-30 MED ORDER — MELOXICAM 15 MG PO TABS: 15.0000 mg | ORAL_TABLET | Freq: Every day | ORAL | Status: DC

## 2014-11-30 NOTE — Discharge Instructions (Signed)
Continue wearing the wrist splint, take meloxicam daily with food. Follow up with orthopedics if no improvement in 1 week.  Carpal Tunnel Syndrome The carpal tunnel is a narrow area located on the palm side of your wrist. The tunnel is formed by the wrist bones and ligaments. Nerves, blood vessels, and tendons pass through the carpal tunnel. Repeated wrist motion or certain diseases may cause swelling within the tunnel. This swelling pinches the main nerve in the wrist (median nerve) and causes the painful hand and arm condition called carpal tunnel syndrome. CAUSES   Repeated wrist motions.  Wrist injuries.  Certain diseases like arthritis, diabetes, alcoholism, hyperthyroidism, and kidney failure.  Obesity.  Pregnancy. SYMPTOMS   A "pins and needles" feeling in your fingers or hand, especially in your thumb, index and middle fingers.  Tingling or numbness in your fingers or hand.  An aching feeling in your entire arm, especially when your wrist and elbow are bent for long periods of time.  Wrist pain that goes up your arm to your shoulder.  Pain that goes down into your palm or fingers.  A weak feeling in your hands. DIAGNOSIS  Your health care provider will take your history and perform a physical exam. An electromyography test may be needed. This test measures electrical signals sent out by your nerves into the muscles. The electrical signals are usually slowed by carpal tunnel syndrome. You may also need X-rays. TREATMENT  Carpal tunnel syndrome may clear up by itself. Your health care provider may recommend a wrist splint or medicine such as a nonsteroidal anti-inflammatory medicine. Cortisone injections may help. Sometimes, surgery may be needed to free the pinched nerve.  HOME CARE INSTRUCTIONS   Take all medicine as directed by your health care provider. Only take over-the-counter or prescription medicines for pain, discomfort, or fever as directed by your health care  provider.  If you were given a splint to keep your wrist from bending, wear it as directed. It is important to wear the splint at night. Wear the splint for as long as you have pain or numbness in your hand, arm, or wrist. This may take 1 to 2 months.  Rest your wrist from any activity that may be causing your pain. If your symptoms are work-related, you may need to talk to your employer about changing to a job that does not require using your wrist.  Put ice on your wrist after long periods of wrist activity.  Put ice in a plastic bag.  Place a towel between your skin and the bag.  Leave the ice on for 15-20 minutes, 03-04 times a day.  Keep all follow-up visits as directed by your health care provider. This includes any orthopedic referrals, physical therapy, and rehabilitation. Any delay in getting necessary care could result in a delay or failure of your condition to heal. SEEK IMMEDIATE MEDICAL CARE IF:   You have new, unexplained symptoms.  Your symptoms get worse and are not helped or controlled with medicines. MAKE SURE YOU:   Understand these instructions.  Will watch your condition.  Will get help right away if you are not doing well or get worse. Document Released: 09/17/2000 Document Revised: 02/04/2014 Document Reviewed: 08/06/2011 [facility] Patient Information 2015 [place], [place]. This information is not intended to replace advice given to you by your health care provider. Make sure you discuss any questions you have with your health care provider.  Carpal Tunnel Release Carpal tunnel release is done to relieve the  pressure on the nerves and tendons on the bottom side of your wrist.  LET YOUR CAREGIVER KNOW ABOUT:   Allergies to food or medicine.  Medicines taken, including vitamins, herbs, eyedrops, over-the-counter medicines, and creams.  Use of steroids (by mouth or creams).  Previous problems with anesthetics or numbing medicines.  History of bleeding  problems or blood clots.  Previous surgery.  Other health problems, including diabetes and kidney problems.  Possibility of pregnancy, if this applies. RISKS AND COMPLICATIONS  Some problems that may happen after this procedure include:  Infection.  Damage to the nerves, arteries or tendons could occur. This would be very uncommon.  Bleeding. BEFORE THE PROCEDURE   This surgery may be done while you are asleep (general anesthetic) or may be done under a block where only your forearm and the surgical area is numb.  If the surgery is done under a block, the numbness will gradually wear off within several hours after surgery. HOME CARE INSTRUCTIONS   Have a responsible person with you for 24 hours.  Do not drive a car or use public transportation for 24 hours.  Only take over-the-counter or prescription medicines for pain, discomfort, or fever as directed by your caregiver. Take them as directed.  You may put ice on the palm side of the affected wrist.  Put ice in a plastic bag.  Place a towel between your skin and the bag.  Leave the ice on for 20 to 30 minutes, 4 times per day.  If you were given a splint to keep your wrist from bending, use it as directed. It is important to wear the splint at night or as directed. Use the splint for as long as you have pain or numbness in your hand, arm, or wrist. This may take 1 to 2 months.  Keep your hand raised (elevated) above the level of your heart as much as possible. This keeps swelling down and helps with discomfort.  Change bandages (dressings) as directed.  Keep the wound clean and dry. SEEK MEDICAL CARE IF:   You develop pain not relieved with medications.  You develop numbness of your hand.  You develop bleeding from your surgical site.  You have an oral temperature above 102 F (38.9 C).  You develop redness or swelling of the surgical site.  You develop new, unexplained problems. SEEK IMMEDIATE MEDICAL CARE  IF:   You develop a rash.  You have difficulty breathing.  You develop any reaction or side effects to medications given. Document Released: 12/11/2003 Document Revised: 12/13/2011 Document Reviewed: 07/27/2007 [facility] Patient Information 2015 [place], [place]. This information is not intended to replace advice given to you by your health care provider. Make sure you discuss any questions you have with your health care provider.

## 2014-11-30 NOTE — ED Notes (Signed)
Pt reports right arm pain from elbow down to fingers x 2-3 months. Has been seen for same in past and told it was carpal tunnel.

## 2014-11-30 NOTE — ED Provider Notes (Signed)
CSN: [id number]     Arrival date & time 11/30/14  1326 History  This chart was scribed for Lucien Mons, PA-C working with Charlesetta Shanks, MD by Mercy Moore, ED Scribe. This patient was seen in room TR09C/TR09C and the patient's care was started at 2:04 PM.   Chief Complaint  Patient presents with  . Arm Pain    The history is provided by the patient. No language interpreter was used.   HPI Comments: April Hudson is a 44 y.o. female who presents to the Emergency Department complaining of worsening right arm pain ongoing for 2-3 months. Patient locates pain at right elbow extending distally into her fingers. Patient reports throbbing pain in her arm from elbow radiating through wrist and numbness in her second and third fingers. Patient shares history of similar pain with diagnosis of carpel tunnel syndrome here 09/06/2014. No known injury or trauma. Patient denies follow up treatment but states that she was referred to a hand specialist. Patient reports past treatment with splinting, without relief. 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   Past Medical History  Diagnosis Date  . History of frequent urinary tract infections   . Smoker   . Joint pain   . History of gastric ulcer     clinical diagnosis, no prior EGD  . Migraines   . Asthma   . Anemia     since at least 2013  . GERD (gastroesophageal reflux disease)   . Allergy    History reviewed. No pertinent past surgical history. Family History  Problem Relation Age of Onset  . Diabetes Mother   . Hypertension Mother   . Cancer Maternal Uncle     stomach   History  Substance Use Topics  . Smoking status: Current Every Day Smoker  . Smokeless tobacco: Not on file  . Alcohol Use: No   OB History    No data available     Review of Systems  Constitutional: Negative for fever and chills.  Musculoskeletal:       Carpel tunnel   Neurological: Positive for numbness.   Allergies  Sulfa antibiotics  Home  Medications   Prior to Admission medications   Medication Sig Start Date End Date Taking? Authorizing Provider  acetaminophen (TYLENOL) 500 MG tablet Take 1,000 mg by mouth every 8 (eight) hours as needed for moderate pain.    Historical Provider, MD  hydrocortisone (ANUSOL-HC) 2.5 % rectal cream Place 1 application rectally 2 (two) times daily. Patient not taking: Reported on 08/22/2014 07/08/14   Camelia Eng Tysinger, PA-C  hydrocortisone (ANUSOL-HC) 25 MG suppository Place 1 suppository (25 mg total) rectally 2 (two) times daily. Patient not taking: Reported on 08/22/2014 07/03/14   Camelia Eng Tysinger, PA-C  meloxicam (MOBIC) 15 MG tablet Take 1 tablet (15 mg total) by mouth daily. 11/30/14   Carman Ching, PA-C  naproxen (NAPROSYN) 500 MG tablet Take 1 tablet (500 mg total) by mouth 2 (two) times daily with a meal. 09/06/14   Margarita Mail, PA-C  omeprazole (PRILOSEC) 40 MG capsule Take 1 capsule (40 mg total) by mouth daily. 09/06/14   Margarita Mail, PA-C  oxyCODONE-acetaminophen (PERCOCET) 5-325 MG per tablet Take 1-2 tablets by mouth every 4 (four) hours as needed. 09/06/14   Margarita Mail, PA-C   Triage Vitals: BP 109/64 mmHg  Pulse 100  Temp(Src) 98.3 F (36.8 C)  Resp 16  Ht 5\' 1"  (1.549 m)  Wt 135 lb (61.236 kg)  BMI 25.52  kg/m2  SpO2 97% Physical Exam  Constitutional: She is oriented to person, place, and time. She appears well-developed and well-nourished. No distress.  HENT:  Head: Normocephalic and atraumatic.  Mouth/Throat: Oropharynx is clear and moist.  Eyes: Conjunctivae and EOM are normal.  Neck: Normal range of motion. Neck supple.  Cardiovascular: Normal rate, regular rhythm and normal heart sounds.   Pulmonary/Chest: Effort normal and breath sounds normal. No respiratory distress.  Musculoskeletal: Normal range of motion. She exhibits no edema.  Mild TTP of elbow. FROM, no swelling. TTP over ventral aspect of right wrist. FROM. No swelling. Positive Tinel's and Phalen's.   Neurological: She is alert and oriented to person, place, and time. No sensory deficit.  Skin: Skin is warm and dry.  Psychiatric: She has a normal mood and affect. Her behavior is normal.  Nursing note and vitals reviewed.   ED Course  Procedures (including critical care time)  COORDINATION OF CARE: 2:09 PM- Plans to refer patient to orthopedist. Discussed treatment plan with patient at bedside and patient agreed to plan.   Labs Review Labs Reviewed - No data to display  Imaging Review No results found.   EKG Interpretation None      MDM   Final diagnoses:  Carpal tunnel syndrome, right   NAD. No tachycardia on my exam. Exam consistent with carpal tunnel. She has wrist splint. Rx meloxicam. F/u with ortho and PCP. Stable for d/c. Return precautions given. Patient states understanding of treatment care plan and is agreeable.  I personally performed the services described in this documentation, which was scribed in my presence. The recorded information has been reviewed and is accurate.  Carman Ching, PA-C 11/30/14 1417  Charlesetta Shanks, MD 12/05/14 [phone]

## 2014-11-30 NOTE — ED Notes (Signed)
Declined W/C at D/C and was escorted to lobby by RN. 

## 2014-12-27 ENCOUNTER — Encounter (HOSPITAL_COMMUNITY): Payer: Self-pay | Admitting: Emergency Medicine

## 2014-12-27 ENCOUNTER — Emergency Department (HOSPITAL_COMMUNITY)
Admission: EM | Admit: 2014-12-27 | Discharge: 2014-12-27 | Disposition: A | Discharge status: Home / Self Care | Payer: BLUE CROSS/BLUE SHIELD | Attending: Emergency Medicine | Admitting: Emergency Medicine

## 2014-12-27 DIAGNOSIS — Z720 Tobacco use: Secondary | ICD-10-CM | POA: Insufficient documentation

## 2014-12-27 DIAGNOSIS — M5410 Radiculopathy, site unspecified: Secondary | ICD-10-CM | POA: Insufficient documentation

## 2014-12-27 DIAGNOSIS — R0789 Other chest pain: Secondary | ICD-10-CM | POA: Insufficient documentation

## 2014-12-27 DIAGNOSIS — Z862 Personal history of diseases of the blood and blood-forming organs and certain disorders involving the immune mechanism: Secondary | ICD-10-CM | POA: Insufficient documentation

## 2014-12-27 DIAGNOSIS — Z7952 Long term (current) use of systemic steroids: Secondary | ICD-10-CM | POA: Insufficient documentation

## 2014-12-27 DIAGNOSIS — M792 Neuralgia and neuritis, unspecified: Secondary | ICD-10-CM

## 2014-12-27 DIAGNOSIS — Z79899 Other long term (current) drug therapy: Secondary | ICD-10-CM | POA: Insufficient documentation

## 2014-12-27 DIAGNOSIS — Z8679 Personal history of other diseases of the circulatory system: Secondary | ICD-10-CM | POA: Insufficient documentation

## 2014-12-27 DIAGNOSIS — Z791 Long term (current) use of non-steroidal anti-inflammatories (NSAID): Secondary | ICD-10-CM | POA: Insufficient documentation

## 2014-12-27 DIAGNOSIS — Z87440 Personal history of urinary (tract) infections: Secondary | ICD-10-CM | POA: Insufficient documentation

## 2014-12-27 DIAGNOSIS — K219 Gastro-esophageal reflux disease without esophagitis: Secondary | ICD-10-CM | POA: Insufficient documentation

## 2014-12-27 DIAGNOSIS — J45909 Unspecified asthma, uncomplicated: Secondary | ICD-10-CM | POA: Insufficient documentation

## 2014-12-27 LAB — BASIC METABOLIC PANEL
ANION GAP: 9 (ref 5–15)
BUN: 9 mg/dL (ref 6–23)
CHLORIDE: 106 mmol/L (ref 96–112)
CO2: 23 mmol/L (ref 19–32)
CREATININE: 0.64 mg/dL (ref 0.50–1.10)
Calcium: 9.2 mg/dL (ref 8.4–10.5)
GFR calc non Af Amer: >90 mL/min (ref >90)
Glucose, Bld: 107 mg/dL — ABNORMAL HIGH (ref 70–99)
Potassium: 3.7 mmol/L (ref 3.5–5.1)
Sodium: 138 mmol/L (ref 135–145)

## 2014-12-27 LAB — CBC WITH DIFFERENTIAL/PLATELET
BASOS ABS: 0 10*3/uL (ref 0.0–0.1)
Basophils Relative: 1 % (ref 0–1)
EOS PCT: 3 % (ref 0–5)
Eosinophils Absolute: 0.2 10*3/uL (ref 0.0–0.7)
HCT: 42.2 % (ref 36.0–46.0)
Hemoglobin: 14.3 g/dL (ref 12.0–15.0)
Lymphocytes Relative: 46 % (ref 12–46)
Lymphs Abs: 2.6 10*3/uL (ref 0.7–4.0)
MCH: 31.6 pg (ref 26.0–34.0)
MCHC: 33.9 g/dL (ref 30.0–36.0)
MCV: 93.2 fL (ref 78.0–100.0)
MONOS PCT: 8 % (ref 3–12)
Monocytes Absolute: 0.4 10*3/uL (ref 0.1–1.0)
Neutro Abs: 2.3 10*3/uL (ref 1.7–7.7)
Neutrophils Relative %: 42 % — ABNORMAL LOW (ref 43–77)
Platelets: 175 10*3/uL (ref 150–400)
RBC: 4.53 MIL/uL (ref 3.87–5.11)
RDW: 13.6 % (ref 11.5–15.5)
WBC: 5.5 10*3/uL (ref 4.0–10.5)

## 2014-12-27 LAB — TROPONIN I

## 2014-12-27 LAB — D-DIMER, QUANTITATIVE: D-Dimer, Quant: <0.27 ug/mL-FEU (ref 0.00–0.48)

## 2014-12-27 MED ORDER — HYDROCODONE-ACETAMINOPHEN 5-325 MG PO TABS
1.0000 | ORAL_TABLET | Freq: Once | ORAL | Status: AC
  Administered 2014-12-27: 1 via ORAL
  Filled 2014-12-27: qty 1

## 2014-12-27 MED ORDER — IBUPROFEN 600 MG PO TABS: 600.0000 mg | ORAL_TABLET | Freq: Four times a day (QID) | ORAL | Status: DC | PRN

## 2014-12-27 MED ORDER — HYDROCODONE-ACETAMINOPHEN 5-325 MG PO TABS: 1.0000 | ORAL_TABLET | Freq: Four times a day (QID) | ORAL | Status: DC | PRN

## 2014-12-27 NOTE — Discharge Instructions (Signed)

## 2014-12-27 NOTE — ED Notes (Signed)
Patient states she has been having right arm pain and swelling for "a while now." Patient denies any injury to affected area. CNS intact. Patient reports CP last night on her left side which she described as sharp.

## 2014-12-27 NOTE — ED Provider Notes (Signed)
CSN: [id number]     Arrival date & time 12/27/14  0522 History   First MD Initiated Contact with Patient 12/27/14 [phone]     Chief Complaint  Patient presents with  . Arm Pain  . Arm Swelling   . Chest Pain     (Consider location/radiation/quality/duration/timing/severity/associated sxs/prior Treatment) HPI   Patient presents with right arm pain and swelling.  Reports several month history of right arm pain.  Pain radiates from her shoulder to her finger and she reports numbness of her fingers.  She feels her arm is more swollen.  Denies neck pain or injury.  ALso reports that she had one episode of sharp chest pain last night.  No SOB or diaphoresis.  OCcured at 6:30 pm.  Denies fever or cough.    Past Medical History  Diagnosis Date  . History of frequent urinary tract infections   . Smoker   . Joint pain   . History of gastric ulcer     clinical diagnosis, no prior EGD  . Migraines   . Asthma   . Anemia     since at least 2013  . GERD (gastroesophageal reflux disease)   . Allergy    History reviewed. No pertinent past surgical history. Family History  Problem Relation Age of Onset  . Diabetes Mother   . Hypertension Mother   . Cancer Maternal Uncle     stomach   History  Substance Use Topics  . Smoking status: Current Every Day Smoker  . Smokeless tobacco: Not on file  . Alcohol Use: No   OB History    No data available     Review of Systems  Constitutional: Negative for fever.  Respiratory: Positive for chest tightness. Negative for cough and shortness of breath.   Cardiovascular: Positive for chest pain. Negative for leg swelling.  Gastrointestinal: Negative for nausea, vomiting and abdominal pain.  Genitourinary: Negative for dysuria.  Musculoskeletal: Negative for back pain.       Arm pain and swelling  Skin: Negative for wound.  Neurological: Positive for numbness. Negative for weakness and headaches.  Psychiatric/Behavioral: Negative for confusion.   All other systems reviewed and are negative.     Allergies  Sulfa antibiotics  Home Medications   Prior to Admission medications   Medication Sig Start Date End Date Taking? Authorizing Provider  acetaminophen (TYLENOL) 500 MG tablet Take 1,000 mg by mouth every 8 (eight) hours as needed for moderate pain.    Historical Provider, MD  HYDROcodone-acetaminophen (NORCO/VICODIN) 5-325 MG per tablet Take 1 tablet by mouth every 6 (six) hours as needed for moderate pain. 12/27/14   Merryl Hacker, MD  hydrocortisone (ANUSOL-HC) 2.5 % rectal cream Place 1 application rectally 2 (two) times daily. Patient not taking: Reported on 08/22/2014 07/08/14   Camelia Eng Tysinger, PA-C  hydrocortisone (ANUSOL-HC) 25 MG suppository Place 1 suppository (25 mg total) rectally 2 (two) times daily. Patient not taking: Reported on 08/22/2014 07/03/14   Camelia Eng Tysinger, PA-C  ibuprofen (ADVIL,MOTRIN) 600 MG tablet Take 1 tablet (600 mg total) by mouth every 6 (six) hours as needed. 12/27/14   Merryl Hacker, MD  meloxicam (MOBIC) 15 MG tablet Take 1 tablet (15 mg total) by mouth daily. 11/30/14   Carman Ching, PA-C  naproxen (NAPROSYN) 500 MG tablet Take 1 tablet (500 mg total) by mouth 2 (two) times daily with a meal. 09/06/14   Margarita Mail, PA-C  omeprazole (PRILOSEC) 40 MG capsule Take 1 capsule (  40 mg total) by mouth daily. 09/06/14   Margarita Mail, PA-C  oxyCODONE-acetaminophen (PERCOCET) 5-325 MG per tablet Take 1-2 tablets by mouth every 4 (four) hours as needed. 09/06/14   Margarita Mail, PA-C   BP 94/67 mmHg  Pulse 86  Temp(Src) 97.9 F (36.6 C) (Oral)  Resp 17  Ht 5\' 1"  (1.549 m)  Wt 172 lb (78.019 kg)  BMI 32.52 kg/m2  SpO2 95% Physical Exam  Constitutional: She is oriented to person, place, and time. She appears well-developed and well-nourished. No distress.  HENT:  Head: Normocephalic and atraumatic.  Eyes: Pupils are equal, round, and reactive to light.  Neck: Normal range of motion.   NO c spine TTP  Cardiovascular: Normal rate, regular rhythm and normal heart sounds.   No murmur heard. Pulmonary/Chest: Effort normal and breath sounds normal. No respiratory distress. She has no wheezes. She exhibits tenderness.  Abdominal: Soft. There is no tenderness.  Musculoskeletal:  TTP over the right brachial plexus which reproduces the pain.  Full ROM of the shoulder and elbow, reports pain with ROM.  2+ radial pulse.  Neurovascularly intact.  Neurological: She is alert and oriented to person, place, and time.  Skin: Skin is warm and dry.  Psychiatric: She has a normal mood and affect.  Nursing note and vitals reviewed.   ED Course  Procedures (including critical care time) Labs Review Labs Reviewed  CBC WITH DIFFERENTIAL/PLATELET - Abnormal; Notable for the following:    Neutrophils Relative % 42 (*)    All other components within normal limits  BASIC METABOLIC PANEL - Abnormal; Notable for the following:    Glucose, Bld 107 (*)    All other components within normal limits  TROPONIN I  D-DIMER, QUANTITATIVE    Imaging Review No results found.   EKG Interpretation None      MDM   Final diagnoses:  Radicular pain in right arm    Patient presents with worsening right arm pain and swelling.  NO appreciable swelling on exam.  NV intact.  Suspect radicular pain given radiation.  See previously and diagnosed with carpal tunnel.  CP is reproducible on exam.  EKG reassuring.  Dimer neg in screening for blood clot.  Patient reports improvement of symptoms with norco.  D/W patient that she may ultimately need MRI imaging if symptoms don't improve.  After history, exam, and medical workup I feel the patient has been appropriately medically screened and is safe for discharge home. Pertinent diagnoses were discussed with the patient. Patient was given return precautions.     Merryl Hacker, MD 12/27/14 [phone]

## 2014-12-30 ENCOUNTER — Ambulatory Visit: Payer: BLUE CROSS/BLUE SHIELD | Attending: Family Medicine | Admitting: Family Medicine

## 2014-12-30 VITALS — BP 109/77 | HR 95 | Temp 98.0°F | Resp 16

## 2014-12-30 DIAGNOSIS — F17200 Nicotine dependence, unspecified, uncomplicated: Secondary | ICD-10-CM

## 2014-12-30 DIAGNOSIS — G5601 Carpal tunnel syndrome, right upper limb: Secondary | ICD-10-CM | POA: Diagnosis not present

## 2014-12-30 DIAGNOSIS — Z720 Tobacco use: Secondary | ICD-10-CM | POA: Diagnosis not present

## 2014-12-30 DIAGNOSIS — Z791 Long term (current) use of non-steroidal anti-inflammatories (NSAID): Secondary | ICD-10-CM | POA: Insufficient documentation

## 2014-12-30 DIAGNOSIS — G5600 Carpal tunnel syndrome, unspecified upper limb: Secondary | ICD-10-CM | POA: Insufficient documentation

## 2014-12-30 DIAGNOSIS — K219 Gastro-esophageal reflux disease without esophagitis: Secondary | ICD-10-CM | POA: Insufficient documentation

## 2014-12-30 MED ORDER — METHYLPREDNISOLONE 4 MG PO KIT: PACK | ORAL | Status: DC

## 2014-12-30 NOTE — Progress Notes (Signed)
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

## 2014-12-30 NOTE — Patient Instructions (Addendum)
Smoking Cessation Quitting smoking is important to your health and has many advantages. However, it is not always easy to quit since nicotine is a very addictive drug. Oftentimes, people try 3 times or more before being able to quit. This document explains the best ways for you to prepare to quit smoking. Quitting takes hard work and a lot of effort, but you can do it. ADVANTAGES OF QUITTING SMOKING  You will live longer, feel better, and live better.  Your body will feel the impact of quitting smoking almost immediately.  Within 20 minutes, blood pressure decreases. Your pulse returns to its normal level.  After 8 hours, carbon monoxide levels in the blood return to normal. Your oxygen level increases.  After 24 hours, the chance of having a heart attack starts to decrease. Your breath, hair, and body stop smelling like smoke.  After 48 hours, damaged nerve endings begin to recover. Your sense of taste and smell improve.  After 72 hours, the body is virtually free of nicotine. Your bronchial tubes relax and breathing becomes easier.  After 2 to 12 weeks, lungs can hold more air. Exercise becomes easier and circulation improves.  The risk of having a heart attack, stroke, cancer, or lung disease is greatly reduced.  After 1 year, the risk of coronary heart disease is cut in half.  After 5 years, the risk of stroke falls to the same as a nonsmoker.  After 10 years, the risk of lung cancer is cut in half and the risk of other cancers decreases significantly.  After 15 years, the risk of coronary heart disease drops, usually to the level of a nonsmoker.  If you are pregnant, quitting smoking will improve your chances of having a healthy baby.  The people you live with, especially any children, will be healthier.  You will have extra money to spend on things other than cigarettes. QUESTIONS TO THINK ABOUT BEFORE ATTEMPTING TO QUIT You may want to talk about your answers with your  health care provider.  Why do you want to quit?  If you tried to quit in the past, what helped and what did not?  What will be the most difficult situations for you after you quit? How will you plan to handle them?  Who can help you through the tough times? Your family? Friends? A health care provider?  What pleasures do you get from smoking? What ways can you still get pleasure if you quit? Here are some questions to ask your health care provider:  How can you help me to be successful at quitting?  What medicine do you think would be best for me and how should I take it?  What should I do if I need more help?  What is smoking withdrawal like? How can I get information on withdrawal? GET READY  Set a quit date.  Change your environment by getting rid of all cigarettes, ashtrays, matches, and lighters in your home, car, or work. Do not let people smoke in your home.  Review your past attempts to quit. Think about what worked and what did not. GET SUPPORT AND ENCOURAGEMENT You have a better chance of being successful if you have help. You can get support in many ways.  Tell your family, friends, and coworkers that you are going to quit and need their support. Ask them not to smoke around you.  Get individual, group, or telephone counseling and support. Programs are available at local hospitals and health centers. Call   your local health department for information about programs in your area.  Spiritual beliefs and practices may help some smokers quit.  Download a "quit meter" on your computer to keep track of quit statistics, such as how long you have gone without smoking, cigarettes not smoked, and money saved.  Get a self-help book about quitting smoking and staying off tobacco. [place] yourself from urges to smoke. Talk to someone, go for a walk, or occupy your time with a task.  Change your normal routine. Take a different route to work.  Drink tea instead of coffee. Eat breakfast in a different place.  Reduce your stress. Take a hot bath, exercise, or read a book.  Plan something enjoyable to do every day. Reward yourself for not smoking.  Explore interactive web-based programs that specialize in helping you quit. GET MEDICINE AND USE IT CORRECTLY Medicines can help you stop smoking and decrease the urge to smoke. Combining medicine with the above behavioral methods and support can greatly increase your chances of successfully quitting smoking.  Nicotine replacement therapy helps deliver nicotine to your body without the negative effects and risks of smoking. Nicotine replacement therapy includes nicotine gum, lozenges, inhalers, nasal sprays, and skin patches. Some may be available over-the-counter and others require a prescription.  Antidepressant medicine helps people abstain from smoking, but how this works is unknown. This medicine is available by prescription.  Nicotinic receptor partial agonist medicine simulates the effect of nicotine in your brain. This medicine is available by prescription. Ask your health care provider for advice about which medicines to use and how to use them based on your health history. Your health care provider will tell you what side effects to look out for if you choose to be on a medicine or therapy. Carefully read the information on the package. Do not use any other product containing nicotine while using a nicotine replacement product.  RELAPSE OR DIFFICULT SITUATIONS Most relapses occur within the first 3 months after quitting. Do not be discouraged if you start smoking again. Remember, most people try several times before finally quitting. You may have symptoms of withdrawal because your body is used to nicotine. You may crave cigarettes, be irritable, feel very hungry, cough often, get headaches, or have difficulty concentrating. The withdrawal symptoms are only temporary. They are strongest  when you first quit, but they will go away within 10-14 days. To reduce the chances of relapse, try to:  Avoid drinking alcohol. Drinking lowers your chances of successfully quitting.  Reduce the amount of caffeine you consume. Once you quit smoking, the amount of caffeine in your body increases and can give you symptoms, such as a rapid heartbeat, sweating, and anxiety.  Avoid smokers because they can make you want to smoke.  Do not let weight gain distract you. Many smokers will gain weight when they quit, usually less than 10 pounds. Eat a healthy diet and stay active. You can always lose the weight gained after you quit.  Find ways to improve your mood other than smoking. FOR MORE INFORMATION  www.smokefree.gov  Document Released: 09/14/2001 Document Revised: 02/04/2014 Document Reviewed: 12/30/2011 [facility] Patient Information 2015 [place], [place]. This information is not intended to replace advice given to you by your health care provider. Make sure you discuss any questions you have with your health care provider. Carpal Tunnel Release Carpal tunnel release is done to relieve the pressure on the nerves and tendons on the bottom side of  your wrist.  LET YOUR CAREGIVER KNOW ABOUT:   Allergies to food or medicine.  Medicines taken, including vitamins, herbs, eyedrops, over-the-counter medicines, and creams.  Use of steroids (by mouth or creams).  Previous problems with anesthetics or numbing medicines.  History of bleeding problems or blood clots.  Previous surgery.  Other health problems, including diabetes and kidney problems.  Possibility of pregnancy, if this applies. RISKS AND COMPLICATIONS  Some problems that may happen after this procedure include:  Infection.  Damage to the nerves, arteries or tendons could occur. This would be very uncommon.  Bleeding. BEFORE THE PROCEDURE   This surgery may be done while you are asleep (general anesthetic) or may be done  under a block where only your forearm and the surgical area is numb.  If the surgery is done under a block, the numbness will gradually wear off within several hours after surgery. HOME CARE INSTRUCTIONS   Have a responsible person with you for 24 hours.  Do not drive a car or use public transportation for 24 hours.  Only take over-the-counter or prescription medicines for pain, discomfort, or fever as directed by your caregiver. Take them as directed.  You may put ice on the palm side of the affected wrist.  Put ice in a plastic bag.  Place a towel between your skin and the bag.  Leave the ice on for 20 to 30 minutes, 4 times per day.  If you were given a splint to keep your wrist from bending, use it as directed. It is important to wear the splint at night or as directed. Use the splint for as long as you have pain or numbness in your hand, arm, or wrist. This may take 1 to 2 months.  Keep your hand raised (elevated) above the level of your heart as much as possible. This keeps swelling down and helps with discomfort.  Change bandages (dressings) as directed.  Keep the wound clean and dry. SEEK MEDICAL CARE IF:   You develop pain not relieved with medications.  You develop numbness of your hand.  You develop bleeding from your surgical site.  You have an oral temperature above 102 F (38.9 C).  You develop redness or swelling of the surgical site.  You develop new, unexplained problems. SEEK IMMEDIATE MEDICAL CARE IF:   You develop a rash.  You have difficulty breathing.  You develop any reaction or side effects to medications given.  Document Released: 12/11/2003 Document Revised: 12/13/2011 Document Reviewed: 07/27/2007     [facility] Patient Information 2015 [place], [place]. This information is not intended to replace advice given to you by your health care provider. Make sure you discuss any questions you have with your health care provider.

## 2014-12-30 NOTE — Progress Notes (Signed)
Subjective:    Patient ID: April Hudson, female    DOB: [birth year], 44 y.o.   MRN: [id number]  HPI  April Hudson presents with right right wrist pain of chronic duration which radiates to the right elbow. Pain is shooting affects the thumb and first 2 fingers and sometimes has numbness. She was seen for same at the ED a few days ago was prescribed ibuprofen. 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 She does have a wrist brace which she has been using; pain at this time a 6-7 out of 10. She does admit to dropping things from the right hand occasionally. She is requesting a note for work so she can be moved to a different section that doesn't involve frequent hand movements. She is right handed.  Past Medical History  Diagnosis Date  . History of frequent urinary tract infections   . Smoker   . Joint pain   . History of gastric ulcer     clinical diagnosis, no prior EGD  . Migraines   . Asthma   . Anemia     since at least 2013  . GERD (gastroesophageal reflux disease)   . Allergy     Allergies  Allergen Reactions  . Sulfa Antibiotics Hives    Outpatient Prescriptions Prior to Visit  Medication Sig Dispense Refill  . HYDROcodone-acetaminophen (NORCO/VICODIN) 5-325 MG per tablet Take 1 tablet by mouth every 6 (six) hours as needed for moderate pain. 10 tablet 0  . ibuprofen (ADVIL,MOTRIN) 600 MG tablet Take 1 tablet (600 mg total) by mouth every 6 (six) hours as needed. 30 tablet 0  . acetaminophen (TYLENOL) 500 MG tablet Take 1,000 mg by mouth every 8 (eight) hours as needed for moderate pain.    . hydrocortisone (ANUSOL-HC) 2.5 % rectal cream Place 1 application rectally 2 (two) times daily. (Patient not taking: Reported on 08/22/2014) 30 g 0  . hydrocortisone (ANUSOL-HC) 25 MG suppository Place 1 suppository (25 mg total) rectally 2 (two) times daily. (Patient not taking: Reported on 08/22/2014) 12 suppository 0  . meloxicam (MOBIC) 15  MG tablet Take 1 tablet (15 mg total) by mouth daily. (Patient not taking: Reported on 12/30/2014) 15 tablet 0  . naproxen (NAPROSYN) 500 MG tablet Take 1 tablet (500 mg total) by mouth 2 (two) times daily with a meal. 30 tablet 0  . omeprazole (PRILOSEC) 40 MG capsule Take 1 capsule (40 mg total) by mouth daily. (Patient not taking: Reported on 12/30/2014) 30 capsule 1  . oxyCODONE-acetaminophen (PERCOCET) 5-325 MG per tablet Take 1-2 tablets by mouth every 4 (four) hours as needed. (Patient not taking: Reported on 12/30/2014) 20 tablet 0   No facility-administered medications prior to visit.      Review of Systems  Constitutional: Negative for fever, appetite change and fatigue.  HENT: Negative for congestion and sore throat.   Respiratory: Negative for chest tightness and shortness of breath.   Cardiovascular: Negative for chest pain.  Gastrointestinal: Negative.   Musculoskeletal:       See HPI  Neurological:       See HPI   .    Objective:   Physical Exam  Constitutional: She appears well-developed and well-nourished.  Cardiovascular: Normal rate and regular rhythm.  Exam reveals no gallop.   No murmur heard. Pulmonary/Chest: Effort normal and breath sounds normal. No respiratory distress. She has no wheezes.  Abdominal: Soft. Bowel sounds are normal. There is no tenderness. There is no  rebound.  Musculoskeletal:       Right wrist: She exhibits tenderness. She exhibits no swelling.       Left wrist: She exhibits no tenderness and no swelling.       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  Carpal tunnel syndrome on the right wrist: Advised to continue NSAIDs after she is done with the acute course of oral steroids. Wrist brace would also be helpful and I have advised her to purchase this. Given a note to her employer to  enable her to switch positions.  Tobacco abuse: Smoking cessation support: smoking cessation hotline: 1-800-QUIT-NOW.  Smoking cessation classes are available through [facility]. Call [phone] or visit our website at [email or web address]  Spent 3 counseling on smoking cessations and patient is not ready to quit.

## 2014-12-31 ENCOUNTER — Encounter: Payer: Self-pay | Admitting: Family Medicine

## 2015-01-06 ENCOUNTER — Ambulatory Visit: Payer: BLUE CROSS/BLUE SHIELD | Admitting: Family Medicine

## 2015-01-13 ENCOUNTER — Encounter: Payer: Self-pay | Admitting: Family Medicine

## 2015-01-13 ENCOUNTER — Ambulatory Visit: Payer: Self-pay | Attending: Family Medicine | Admitting: Family Medicine

## 2015-01-13 VITALS — HR 99 | Temp 98.6°F | Resp 16 | Ht 61.0 in | Wt 161.0 lb

## 2015-01-13 DIAGNOSIS — M542 Cervicalgia: Secondary | ICD-10-CM | POA: Insufficient documentation

## 2015-01-13 DIAGNOSIS — Z720 Tobacco use: Secondary | ICD-10-CM

## 2015-01-13 DIAGNOSIS — M79641 Pain in right hand: Secondary | ICD-10-CM | POA: Insufficient documentation

## 2015-01-13 DIAGNOSIS — F17200 Nicotine dependence, unspecified, uncomplicated: Secondary | ICD-10-CM

## 2015-01-13 DIAGNOSIS — G5601 Carpal tunnel syndrome, right upper limb: Secondary | ICD-10-CM

## 2015-01-13 MED ORDER — GABAPENTIN 300 MG PO CAPS: 300.0000 mg | ORAL_CAPSULE | Freq: Every day | ORAL | Status: DC

## 2015-01-13 MED ORDER — IBUPROFEN 200 MG PO TABS: 400.0000 mg | ORAL_TABLET | Freq: Three times a day (TID) | ORAL | Status: DC

## 2015-01-13 NOTE — Patient Instructions (Addendum)
April Hudson,  Thank you for coming in today.  1. R neck pain: MRI of neck ordered of Gabapentin 300 mg nightly recommended this is medication that calms nerve pain  2. R hand pain: Carpal tunnel  Wrist x-ray  Wear wrist splint Ibuprofen with food up to 3 times a day Referral to physical therapy and sports medicine.  3. Smoking: please work on quitting as cigarettes delay healing Smoking cessation support: smoking cessation hotline: 1-800-QUIT-NOW.  Smoking cessation classes are available through [facility]. Call [phone] or visit our website at [email or web address]   F/u in 6 weeks for neck pain and carpal tunnel  Dr. Adrian Blackwater

## 2015-01-13 NOTE — Progress Notes (Signed)
F/u Carpotunnel

## 2015-01-14 NOTE — Assessment & Plan Note (Signed)
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

## 2015-01-14 NOTE — Addendum Note (Signed)
Addended by: Boykin Nearing on: 01/14/2015 05:54 PM   Modules accepted: Level of Service

## 2015-01-14 NOTE — Progress Notes (Signed)
   Subjective:    Patient ID: April Hudson, female    DOB: [birth year], 44 y.o.   MRN: [id number] CC: establish care, neck and R hand pain  HPI  1. Neck pain: pain in neck. Pain on R. X 10 weeks. Radiating down to arms. Sharp pains.   2. R hand pain: in 3rd fingers. X 4 weeks. First three fingers. Wearing a wrist splint. Pain limits works. She works at [employer]. She cleans the breakfast dishes.   Soc Hx: current smoker, not ready to quit  Med Hx: GERD, anemia  Surg Hx:  Negative  Review of Systems  Constitutional: Negative.   Respiratory: Negative for cough, chest tightness and shortness of breath.   Cardiovascular: Negative for chest pain.  Musculoskeletal: Positive for arthralgias and neck pain.      Objective:   Physical Exam Pulse 99  Temp(Src) 98.6 F (37 C) (Oral)  Resp 16  Ht 5\' 1"  (1.549 m)  Wt 161 lb (73.029 kg)  BMI 30.44 kg/m2  SpO2 97% General appearance: alert, cooperative and no distress Neck: no adenopathy, supple, symmetrical, trachea midline and thyroid not enlarged, symmetric, no tenderness/mass/nodules, limited rotation,  Lungs: clear to auscultation bilaterally Heart: regular rate and rhythm, S1, S2 normal, no murmur, click, rub or gallop Extremities: extremities normal, atraumatic, no cyanosis or edema, + Phalen R side      Assessment & Plan:

## 2015-01-14 NOTE — Assessment & Plan Note (Signed)
Smoking: please work on quitting as cigarettes delay healing Smoking cessation support: smoking cessation hotline: 1-800-QUIT-NOW.  Smoking cessation classes are available through [facility]. Call [phone] or visit our website at [email or web address]

## 2015-01-14 NOTE — Assessment & Plan Note (Addendum)
R hand pain: Carpal tunnel  Wrist x-ray  Wear wrist splint Ibuprofen with food up to 3 times a day Referral to physical therapy and sports medicine.

## 2015-01-17 ENCOUNTER — Telehealth: Payer: Self-pay | Admitting: Medical

## 2015-01-17 ENCOUNTER — Telehealth: Payer: Self-pay | Admitting: *Deleted

## 2015-01-17 NOTE — Telephone Encounter (Signed)
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 Patient states that this morning she was in a lot of pain, and has swelling on her neck down to her shoulder. She would like to speak to a nurse in regards to her pain. Please f/u.

## 2015-01-17 NOTE — Telephone Encounter (Signed)
Pt will schedule F/U appointment  Stated if pain persist will not go to work tomorrow

## 2015-01-17 NOTE — Telephone Encounter (Signed)
Patient states that she is in a lot of pain and that she believes that she will not be able to go to work tomorrow and would like a note for work. Please f/u with pt.

## 2015-01-17 NOTE — Telephone Encounter (Signed)
Patient called in asking if she could not go to work Mon and Tuesday of next week due to her neck and wrist pain and when she comes in for her PCP appointment on Thursday Dr. Adrian Blackwater would write her an excuse note.  I told her Dr. Adrian Blackwater was out of the clinic today and that I could not speak for what she would do.

## 2015-01-23 ENCOUNTER — Encounter: Payer: Self-pay | Admitting: Family Medicine

## 2015-01-23 ENCOUNTER — Ambulatory Visit: Payer: Self-pay | Attending: Family Medicine | Admitting: Family Medicine

## 2015-01-23 VITALS — BP 102/69 | HR 86 | Temp 97.7°F | Resp 16 | Ht 61.0 in | Wt 168.0 lb

## 2015-01-23 DIAGNOSIS — Z720 Tobacco use: Secondary | ICD-10-CM | POA: Insufficient documentation

## 2015-01-23 DIAGNOSIS — M542 Cervicalgia: Secondary | ICD-10-CM | POA: Insufficient documentation

## 2015-01-23 DIAGNOSIS — Z23 Encounter for immunization: Secondary | ICD-10-CM

## 2015-01-23 DIAGNOSIS — G5601 Carpal tunnel syndrome, right upper limb: Secondary | ICD-10-CM | POA: Insufficient documentation

## 2015-01-23 MED ORDER — GABAPENTIN 300 MG PO CAPS: ORAL_CAPSULE | ORAL | Status: DC

## 2015-01-23 MED ORDER — IBUPROFEN 600 MG PO TABS: 600.0000 mg | ORAL_TABLET | Freq: Three times a day (TID) | ORAL | Status: DC | PRN

## 2015-01-23 MED ORDER — ACETAMINOPHEN-CODEINE #3 300-30 MG PO TABS: 1.0000 | ORAL_TABLET | Freq: Three times a day (TID) | ORAL | Status: DC | PRN

## 2015-01-23 NOTE — Addendum Note (Signed)
Addended by: Betti Cruz on: 01/23/2015 05:07 PM   Modules accepted: Orders

## 2015-01-23 NOTE — Progress Notes (Signed)
   Subjective:    Patient ID: April Hudson, female    DOB: October 01, 1971, 44 y.o.   MRN: [id number] CC: F/uneck pain,  R wrist and hand pain  HPI   1. R neck pain: still awaiting MRI. Worsening R sided neck pain with burning in R upper shoulder. No rash. Her work is not complying with light-duty instructions written in last letter. She was working at the assembly which requires repetitive motion.  Normal grip strength. Compliant with gabapentin 300 mg at night and ibuprofen. Is out of vicodin.  2. R carpal tunnel: wearing wrist splint. Wrist feels better. Has not started PT due to some problem with the order.   Soc Hx: current smoker  Review of Systems  Constitutional: Negative for fever and chills.  Musculoskeletal: Positive for myalgias, arthralgias and neck pain. Negative for joint swelling.       Objective:   Physical Exam BP 102/69 mmHg  Pulse 86  Temp(Src) 97.7 F (36.5 C)  Resp 16  Ht 5\' 1"  (1.549 m)  Wt 168 lb (76.204 kg)  BMI 31.76 kg/m2  SpO2 98% General appearance: alert, cooperative and no distress  Skin: no rash or lesions. Skin warm and dry.  Neck: supple. Normal ROM. Pain R lateral neck to upper shoulder. Ext: forearm atrophy R>L Normal grip strength      Assessment & Plan:

## 2015-01-23 NOTE — Assessment & Plan Note (Signed)
Carpal tunnel: improved. Continue wrist splint. Plan for PT

## 2015-01-23 NOTE — Progress Notes (Signed)
F/U neck pain Complaining of neck swelling  Worsen since last visit

## 2015-01-23 NOTE — Patient Instructions (Addendum)
April Hudson,  Thank you for coming in to see me today.  1. Neck pain R sided I suspect nerve root irritation from a disc or joint space narrowing Continue ibuprofen 600 mg 2-3 times daily with food Continue gabapentin 600 mg at night tonight and for next 3 days, then 300 mg in morning and 600 mg at night for 3 days, then 300 mg morning and afternoon with 600 mg at night.   2. Carpal tunnel: improved. Continue wrist splint. Plan for PT.  Out of work until 02/06/2015.   F/u with me in 2-3 weeks  Dr. Adrian Blackwater

## 2015-01-23 NOTE — Assessment & Plan Note (Signed)
1. Neck pain R sided I suspect nerve root irritation from a disc or joint space narrowing Continue ibuprofen 600 mg 2-3 times daily with food Continue gabapentin 600 mg at night tonight and for next 3 days, then 300 mg in morning and 600 mg at night for 3 days, then 300 mg morning and afternoon with 600 mg at night.  Marland Kitchen

## 2015-01-27 ENCOUNTER — Ambulatory Visit: Payer: Self-pay | Attending: Family Medicine

## 2015-02-03 ENCOUNTER — Ambulatory Visit (HOSPITAL_COMMUNITY)
Admission: RE | Admit: 2015-02-03 | Discharge: 2015-02-03 | Disposition: A | Discharge status: Home / Self Care | Payer: Self-pay | Source: Ambulatory Visit | Attending: Family Medicine | Admitting: Family Medicine

## 2015-02-03 ENCOUNTER — Other Ambulatory Visit: Payer: Self-pay | Admitting: Family Medicine

## 2015-02-03 DIAGNOSIS — G5601 Carpal tunnel syndrome, right upper limb: Secondary | ICD-10-CM

## 2015-02-03 DIAGNOSIS — M79641 Pain in right hand: Secondary | ICD-10-CM | POA: Insufficient documentation

## 2015-02-03 DIAGNOSIS — M542 Cervicalgia: Secondary | ICD-10-CM | POA: Insufficient documentation

## 2015-02-03 DIAGNOSIS — M25511 Pain in right shoulder: Secondary | ICD-10-CM | POA: Insufficient documentation

## 2015-02-03 DIAGNOSIS — R200 Anesthesia of skin: Secondary | ICD-10-CM | POA: Insufficient documentation

## 2015-02-03 NOTE — Assessment & Plan Note (Signed)
No disc disease or narrowing of joint in neck causing R arm symptoms  Neurology referral placed

## 2015-02-04 ENCOUNTER — Telehealth: Payer: Self-pay | Admitting: *Deleted

## 2015-02-04 NOTE — Telephone Encounter (Signed)
Image has already been resulted.  No disc disease or narrowing of joint in neck causing R arm symptoms  Neurology referral placed

## 2015-02-04 NOTE — Telephone Encounter (Signed)
Patient calling in to ask about the results of her spinal MRI-will route to PCP

## 2015-02-05 ENCOUNTER — Telehealth: Payer: Self-pay | Admitting: Family Medicine

## 2015-02-05 NOTE — Telephone Encounter (Signed)
Patient called stating that she is still swollen and is scheduled to go back to work tomorrow, she wanted to know if it's okay to go back to work tomorrow even if she still has swelling on her neck, back and shoulder. Please f/u

## 2015-02-05 NOTE — Telephone Encounter (Signed)
Yes it is ok to return to work. The swelling will not go away completely. Take ibuprofen.

## 2015-02-05 NOTE — Telephone Encounter (Signed)
Spoke with patient and gave her PCP answer that she could go to work tomorrow and take ibuprofen as directed on the bottle for pain and inflammation.

## 2015-02-07 NOTE — Telephone Encounter (Signed)
Patient called requesting to speak to nurse regarding her MRI results , and to see what is the next step. Please f/u

## 2015-02-10 ENCOUNTER — Telehealth: Payer: Self-pay | Admitting: *Deleted

## 2015-02-10 NOTE — Telephone Encounter (Signed)
Pt aware.

## 2015-02-10 NOTE — Telephone Encounter (Signed)
Pt in clinic requesting MRI Results Results given advised to go for X-ray as was advised last visit Pt stated will go today      Result Notes     Notes Recorded by Boykin Nearing, MD on 02/03/2015 at 3:36 PM No disc disease or narrowing of joint in neck causing R arm symptoms  Neurology referral placed

## 2015-02-12 ENCOUNTER — Ambulatory Visit (HOSPITAL_COMMUNITY)
Admission: RE | Admit: 2015-02-12 | Discharge: 2015-02-12 | Disposition: A | Discharge status: Home / Self Care | Payer: Self-pay | Source: Ambulatory Visit | Attending: Family Medicine | Admitting: Family Medicine

## 2015-02-12 DIAGNOSIS — G5601 Carpal tunnel syndrome, right upper limb: Secondary | ICD-10-CM

## 2015-02-12 DIAGNOSIS — M19031 Primary osteoarthritis, right wrist: Secondary | ICD-10-CM | POA: Insufficient documentation

## 2015-02-28 ENCOUNTER — Telehealth: Payer: Self-pay | Admitting: *Deleted

## 2015-02-28 NOTE — Telephone Encounter (Signed)
Pt aware of results 

## 2015-02-28 NOTE — Telephone Encounter (Signed)
-----   Message from Boykin Nearing, MD sent at 02/13/2015  8:25 AM EDT ----- Wrist x-ray revealed moderate osteoarthritis, no fracture

## 2015-03-12 ENCOUNTER — Ambulatory Visit (INDEPENDENT_AMBULATORY_CARE_PROVIDER_SITE_OTHER): Payer: Self-pay | Admitting: Neurology

## 2015-03-12 ENCOUNTER — Encounter: Payer: Self-pay | Admitting: Neurology

## 2015-03-12 VITALS — BP 110/70 | HR 91 | Ht 61.0 in | Wt 168.1 lb

## 2015-03-12 DIAGNOSIS — M654 Radial styloid tenosynovitis [de Quervain]: Secondary | ICD-10-CM

## 2015-03-12 DIAGNOSIS — G5601 Carpal tunnel syndrome, right upper limb: Secondary | ICD-10-CM

## 2015-03-12 NOTE — Patient Instructions (Addendum)
Use wrist splint on right hand Continue gabapentin 300mg  twice daily Please follow-up with your primary care doctor for evaluation of possible inflammation of the tendons around your wrist.  You are also welcome to see a Sports Medicine physician for this, if you prefer (Dr. Hulan Saas). Once you get insurance, please contact my office so we can schedule EMG Please try extra hard at quitting smoking! Return to clinic as needed

## 2015-03-12 NOTE — Progress Notes (Signed)
[facility] Clinic Note - Initial Visit   Date: 03/12/2015   April Hudson MRN: [id number] DOB: 26-Dec-1970   Dear Dr. Adrian Blackwater:  Thank you for your kind referral of Terena Bohan for consultation of bilateral hand pain. Although her history is well known to you, please allow [place] to reiterate it for the purpose of our medical record. The patient was accompanied to the clinic by husband who also provides collateral information.     History of Present Illness: April Hudson is a 44 y.o. right-handed African American female with tobacco use presenting for evaluation of bilateral hand paresthesias and neck pain.    Start around December 2015, she developed sharp pain involving bilateral thumbs and radiating into her wrists.  She has noticed mild swelling around the same area, which is worse with thumb movement or repetitive use. Pain is constant on the right hand.  She also reports having numbness over the tips of the right middle and ring finger.  Grip strength is weaker and she endorses dropping objects sometimes.  She wakes up at times with her arm asleep and tries to shake it off, which usually helps.  Because of concern of carpal tunnel syndrome, she started using a wrist splint and taking gabapenitn 300mg  twice daily, which seems to help her numbness, but does not change the wrist pain.  Aleve and ibuprofen helps her pain for a few hours but because of GI ulcers, she cannot take very much of this. She saw her PCP for these symptoms who ordered MRI cervical spine which returned normal and referred her for further evaluation.   Out-side paper records, electronic medical record, and images have been reviewed where available and summarized as:  MRI cervical spine wo contrast 02/03/2015: No disc herniation or stenosis.  Past Medical History  Diagnosis Date  . History of frequent urinary tract infections   . Smoker   . Joint pain   . History of gastric ulcer      clinical diagnosis, no prior EGD  . Migraines Dx 2013  . Anemia     since at least 2013  . Allergy   . GERD (gastroesophageal reflux disease) Dx 2015  . Asthma     as a child    No past surgical history on file.   Medications:  Current Outpatient Prescriptions on File Prior to Visit  Medication Sig Dispense Refill  . acetaminophen (TYLENOL) 500 MG tablet Take 1,000 mg by mouth every 8 (eight) hours as needed for moderate pain.    Marland Kitchen acetaminophen-codeine (TYLENOL #3) 300-30 MG per tablet Take 1 tablet by mouth every 8 (eight) hours as needed for moderate pain. 45 tablet 0  . gabapentin (NEURONTIN) 300 MG capsule 300 mg in morning, 300 mg in afternoon, 600 mg at night 120 capsule 1  . ibuprofen (ADVIL,MOTRIN) 600 MG tablet Take 1 tablet (600 mg total) by mouth every 8 (eight) hours as needed. 30 tablet 2   No current facility-administered medications on file prior to visit.    Allergies:  Allergies  Allergen Reactions  . Sulfa Antibiotics Hives    Family History: Family History  Problem Relation Age of Onset  . Diabetes Mother   . Hypertension Mother   . Cancer Maternal Uncle     stomach    Social History: History   Social History  . Marital Status: Married    Spouse Name: N/A  . Number of Children: N/A  . Years of Education: N/A   Occupational  History  . Not on file.   Social History Main Topics  . Smoking status: Current Every Day Smoker  . Smokeless tobacco: Not on file  . Alcohol Use: No  . Drug Use: No  . Sexual Activity: Not on file   Other Topics Concern  . Not on file   Social History Narrative    Review of Systems:  CONSTITUTIONAL: No fevers, chills, night sweats, or weight loss.   EYES: No visual changes or eye pain ENT: No hearing changes.  No history of nose bleeds.   RESPIRATORY: No cough, wheezing and shortness of breath.   CARDIOVASCULAR: Negative for chest pain, and palpitations.   GI: Negative for abdominal discomfort, blood in  stools or black stools.  No recent change in bowel habits.   GU:  No history of incontinence.   MUSCLOSKELETAL: +history of joint pain or swelling.  No myalgias.   SKIN: Negative for lesions, rash, and itching.   HEMATOLOGY/ONCOLOGY: Negative for prolonged bleeding, bruising easily, and swollen nodes.  No history of cancer.   ENDOCRINE: Negative for cold or heat intolerance, polydipsia or goiter.   PSYCH:  No depression or anxiety symptoms.   NEURO: As Above.   Vital Signs:  BP 110/70 mmHg  Pulse 91  Ht 5\' 1"  (1.549 m)  Wt 168 lb 2 oz (76.261 kg)  BMI 31.78 kg/m2  SpO2 95%   General Medical Exam:   General:  Well appearing, comfortable.   Eyes/ENT: see cranial nerve examination.   Neck: No masses appreciated.  Full range of motion without tenderness.  No carotid bruits. Respiratory:  Clear to auscultation, good air entry bilaterally.   Cardiac:  Regular rate and rhythm, no murmur.   Extremities:  Mild edema over the radial aspect of the wrist and tenderness to palpation.  Testing for de quervain's tensynovitis reproduced wrists pain bilaterally. Skin:  No rashes or lesions.  Neurological Exam: MENTAL STATUS including orientation to time, place, person, recent and remote memory, attention span and concentration, language, and fund of knowledge is normal.  Speech is not dysarthric.  CRANIAL NERVES: II:  No visual field defects.  Unremarkable fundi.   III-IV-VI: Pupils equal round and reactive to light.  Normal conjugate, extra-ocular eye movements in all directions of gaze.  No nystagmus.  No ptosis.   V:  Normal facial sensation.    VII:  Normal facial symmetry and movements.   VIII:  Normal hearing and vestibular function.   IX-X:  Normal palatal movement.   XI:  Normal shoulder shrug and head rotation.   XII:  Normal tongue strength and range of motion, no deviation or fasciculation.  MOTOR:  No atrophy, fasciculations or abnormal movements.  No pronator drift.  Tone is  normal.    Right Upper Extremity:    Left Upper Extremity:    Deltoid  5/5   Deltoid  5/5   Biceps  5/5   Biceps  5/5   Triceps  5/5   Triceps  5/5   Wrist extensors  5/5   Wrist extensors  5/5   Wrist flexors  5/5   Wrist flexors  5/5   Finger extensors  5/5   Finger extensors  5/5   Finger flexors  5/5   Finger flexors  5/5   Dorsal interossei  5/5   Dorsal interossei  5/5   Abductor pollicis  5-/5   Abductor pollicis  5/5   Tone (Ashworth scale)  0  Tone (Ashworth scale)  0  Right Lower Extremity:    Left Lower Extremity:    Hip flexors  5/5   Hip flexors  5/5   Hip extensors  5/5   Hip extensors  5/5   Knee flexors  5/5   Knee flexors  5/5   Knee extensors  5/5   Knee extensors  5/5   Dorsiflexors  5/5   Dorsiflexors  5/5   Plantarflexors  5/5   Plantarflexors  5/5   Toe extensors  5/5   Toe extensors  5/5   Toe flexors  5/5   Toe flexors  5/5   Tone (Ashworth scale)  0  Tone (Ashworth scale)  0   MSRs:  Right                                                                 Left brachioradialis 2+  brachioradialis 2+  biceps 2+  biceps 2+  triceps 2+  triceps 2+  patellar 2+  patellar 2+  ankle jerk 2+  ankle jerk 2+  Hoffman no  Hoffman no  plantar response down  plantar response down   SENSORY:  Reduced temperature over the right fingertip of the 3rd and 4th digits, intact pin prick throughout.  Tinel's positive over the right wrist only.    COORDINATION/GAIT: Normal finger-to- nose-finger and heel-to-shin.  Intact rapid alternating movements bilaterally.  Able to rise from a chair without using arms.  Gait narrow based and stable. Tandem and stressed gait intact.    IMPRESSION/PLAN: Mrs. Koch is a 44 year-old female presenting for evaluation bilateral wrist pain and right hand paresthesias.  Based on her exam and history, she most likely had right carpal tunnel syndrome which is causing her numbness of the right finger tips, but CTS would not explain her wrist pain  and swelling, which is more suggestive of De Quervain's tensynovitis.  She may benefit from a steroid injection, which her primary care provider may be able to perform, if not, we can refer her to Sports Medicine (Dr. Hulan Saas) for this.  Because she does not have insurance, she would like to hold off on additional electrodiagnostic testing and follow-up with her PCP to discuss management options of tensynovitis.    For her carpal tunnel syndrome, encouraged her to use right wrist splint and continue gabapentin 300mg  BID.   If symptoms do not improve, proceed with NCS/EMG of the upper extremities.  Smoking cessation instruction/counseling given:  counseled patient on the dangers of tobacco use, advised patient to stop smoking, and reviewed strategies to maximize success  Return to clinic as needed  The duration of this appointment visit was 40 minutes of face-to-face time with the patient.  Greater than 50% of this time was spent in counseling, explanation of diagnosis, planning of further management, and coordination of care.   Thank you for allowing me to participate in patient's care.  If I can answer any additional questions, I would be pleased to do so.    Sincerely,    Rayshaun Needle K. Posey Pronto, DO

## 2015-03-29 ENCOUNTER — Encounter (HOSPITAL_COMMUNITY): Payer: Self-pay | Admitting: *Deleted

## 2015-03-29 ENCOUNTER — Emergency Department (HOSPITAL_COMMUNITY)
Admission: EM | Admit: 2015-03-29 | Discharge: 2015-03-29 | Disposition: A | Discharge status: Home / Self Care | Payer: Self-pay | Attending: Emergency Medicine | Admitting: Emergency Medicine

## 2015-03-29 DIAGNOSIS — K029 Dental caries, unspecified: Secondary | ICD-10-CM | POA: Insufficient documentation

## 2015-03-29 DIAGNOSIS — Z862 Personal history of diseases of the blood and blood-forming organs and certain disorders involving the immune mechanism: Secondary | ICD-10-CM | POA: Insufficient documentation

## 2015-03-29 DIAGNOSIS — Z720 Tobacco use: Secondary | ICD-10-CM | POA: Insufficient documentation

## 2015-03-29 DIAGNOSIS — G43909 Migraine, unspecified, not intractable, without status migrainosus: Secondary | ICD-10-CM | POA: Insufficient documentation

## 2015-03-29 DIAGNOSIS — Z79899 Other long term (current) drug therapy: Secondary | ICD-10-CM | POA: Insufficient documentation

## 2015-03-29 DIAGNOSIS — J45909 Unspecified asthma, uncomplicated: Secondary | ICD-10-CM | POA: Insufficient documentation

## 2015-03-29 MED ORDER — PENICILLIN V POTASSIUM 500 MG PO TABS: 500.0000 mg | ORAL_TABLET | Freq: Four times a day (QID) | ORAL | Status: DC

## 2015-03-29 MED ORDER — IBUPROFEN 800 MG PO TABS: 800.0000 mg | ORAL_TABLET | Freq: Three times a day (TID) | ORAL | Status: DC | PRN

## 2015-03-29 MED ORDER — HYDROCODONE-ACETAMINOPHEN 5-325 MG PO TABS: 1.0000 | ORAL_TABLET | Freq: Four times a day (QID) | ORAL | Status: DC | PRN

## 2015-03-29 MED ORDER — HYDROCODONE-ACETAMINOPHEN 5-325 MG PO TABS
1.0000 | ORAL_TABLET | Freq: Once | ORAL | Status: AC
  Administered 2015-03-29: 1 via ORAL
  Filled 2015-03-29: qty 1

## 2015-03-29 NOTE — ED Notes (Signed)
Pt reports RT lower tooth broke on Friday. Pain is 9/10 .

## 2015-03-29 NOTE — ED Notes (Signed)
Declined W/C at D/C and was escorted to lobby by RN. 

## 2015-03-29 NOTE — Discharge Instructions (Signed)
Return here as needed. Follow up with the dentist provided. °

## 2015-03-29 NOTE — ED Provider Notes (Signed)
CSN: [id number]     Arrival date & time 03/29/15  1100 History  This chart was scribed for non-physician practitioner, Irena Cords, PA-C,working with No att. providers found, by Marlowe Kays, ED Scribe. This patient was seen in room TR07C/TR07C and the patient's care was started at 11:39 AM.  Chief Complaint  Patient presents with  . Dental Problem   The history is provided by the patient and medical records. No language interpreter was used.    HPI Comments:  April Hudson is a 44 y.o. female who presents to the Emergency Department complaining of severe right lower dental pain that began yesterday. She reports she was eating an apple when the tooth broke. She has not done anything to treat her pain. Eating makes the pain worse. She denies any alleviating factors. Denies fever, chills, facial swelling, nausea or vomiting.   Past Medical History  Diagnosis Date  . History of frequent urinary tract infections   . Smoker   . Joint pain   . History of gastric ulcer     clinical diagnosis, no prior EGD  . Migraines Dx 2013  . Anemia     since at least 2013  . Allergy   . GERD (gastroesophageal reflux disease) Dx 2015  . Asthma     as a child   Past Surgical History  Procedure Laterality Date  . Knee surgery    . Vaginal hysterectomy     Family History  Problem Relation Age of Onset  . Diabetes Mother   . Hypertension Mother   . Cancer Maternal Uncle     stomach  . Heart disease Sister   . Healthy Brother   . Asthma Son   . Asthma Daughter    History  Substance Use Topics  . Smoking status: Current Every Day Smoker -- 1.00 packs/day for 30 years    Types: Cigarettes  . Smokeless tobacco: Never Used  . Alcohol Use: No   OB History    No data available     Review of Systems  Constitutional: Negative for fever and chills.  HENT: Positive for dental problem. Negative for facial swelling.   Gastrointestinal: Negative for nausea and vomiting.    Allergies   Sulfa antibiotics  Home Medications   Prior to Admission medications   Medication Sig Start Date End Date Taking? Authorizing Provider  acetaminophen (TYLENOL) 500 MG tablet Take 1,000 mg by mouth every 8 (eight) hours as needed for moderate pain.    Historical Provider, MD  acetaminophen-codeine (TYLENOL #3) 300-30 MG per tablet Take 1 tablet by mouth every 8 (eight) hours as needed for moderate pain. 01/23/15   Josalyn Funches, MD  gabapentin (NEURONTIN) 300 MG capsule 300 mg in morning, 300 mg in afternoon, 600 mg at night 01/23/15   Josalyn Funches, MD  ibuprofen (ADVIL,MOTRIN) 600 MG tablet Take 1 tablet (600 mg total) by mouth every 8 (eight) hours as needed. 01/23/15   Josalyn Funches, MD   Triage Vitals: BP 109/76 mmHg  Pulse 95  Temp(Src) 97.9 F (36.6 C) (Oral)  Resp 16  SpO2 99% Physical Exam  Constitutional: She is oriented to person, place, and time. She appears well-developed and well-nourished.  HENT:  Head: Normocephalic and atraumatic.  Mouth/Throat: Uvula is midline, oropharynx is clear and moist and mucous membranes are normal. No trismus in the jaw. Abnormal dentition. Dental caries present. No dental abscesses.  3rd molar on lower right side with fracture to gumline. Multiple areas of dental decay throughout.  Eyes: EOM are normal.  Neck: Normal range of motion.  Cardiovascular: Normal rate.   Pulmonary/Chest: Effort normal.  Musculoskeletal: Normal range of motion.  Neurological: She is alert and oriented to person, place, and time.  Skin: Skin is warm and dry.  Psychiatric: She has a normal mood and affect. Her behavior is normal.  Nursing note and vitals reviewed.   ED Course  Procedures (including critical care time) DIAGNOSTIC STUDIES: Oxygen Saturation is 99% on RA, normal by my interpretation.   COORDINATION OF CARE: 11:42 AM- Will prescribe pain medication and refer to dentist. Pt verbalizes understanding and agrees to plan.     I personally  performed the services described in this documentation, which was scribed in my presence. The recorded information has been reviewed and is accurate.    Dalia Heading, PA-C 03/30/15 0258  Davonna Belling, MD 03/30/15 [phone]

## 2015-04-03 ENCOUNTER — Ambulatory Visit: Payer: Self-pay | Attending: Family Medicine | Admitting: Physician Assistant

## 2015-04-03 VITALS — BP 105/71 | HR 85 | Temp 98.1°F | Resp 18 | Ht 61.0 in | Wt 164.0 lb

## 2015-04-03 DIAGNOSIS — R197 Diarrhea, unspecified: Secondary | ICD-10-CM

## 2015-04-03 DIAGNOSIS — R1030 Lower abdominal pain, unspecified: Secondary | ICD-10-CM

## 2015-04-03 MED ORDER — DIPHENOXYLATE-ATROPINE 2.5-0.025 MG PO TABS: 2.0000 | ORAL_TABLET | Freq: Four times a day (QID) | ORAL | Status: DC | PRN

## 2015-04-03 NOTE — Progress Notes (Signed)
Patient reports diarrhea since yesterday. Started bleeding from rectum last night and is still bleeding, described as light red in toilet water and on toilet tissue. Patient reports stomach pain, at level 9, described as sharp, achy, constant pain since yesterday. Hysterectomy 6 years ago, knot to right side of hysterectomy incision on lower abdomen.

## 2015-04-03 NOTE — Progress Notes (Signed)
Chief Complaint: Abdominal pain and diarrhea  Subjective: This is a 44 year old female who is presenting with abdominal pain and diarrhea. She states that yesterday at 3 PM she started with loose stools. She has not had any sick contacts. She has not had any food from a restaurant. Her stools started out brown and loose and then turned green. She also has had some tinges of blood in the stool and on the toilet paper. She's roughly had approximately 9 stools. She had a difficult time trying to sleep last night. Her symptoms have not slowed down. She has some crampy lower abdominal pain as well. Her last bowel movement was approximately 1 hour ago. Denies nausea or vomiting.   ROS:  GEN: denies fever or chills, denies change in weight Skin: denies lesions or rashes HEENT: denies headache, earache, epistaxis, sore throat, or neck pain LUNGS: denies SHOB, dyspnea, PND, orthopnea CV: denies CP or palpitations ABD: + abd pain, -N or V, +diarrhea , no constipation    Objective:  Filed Vitals:   04/03/15 1149  BP: 105/71  Pulse: 85  Temp: 98.1 F (36.7 C)  TempSrc: Oral  Resp: 18  Height: 5\' 1"  (1.549 m)  Weight: 164 lb (74.39 kg)  SpO2: 98%    Physical Exam:  General: in no acute distress. HEENT: no pallor, no icterus, moist oral mucosa, no JVD, no lymphadenopathy Heart: Normal  s1 &s2  Regular rate and rhythm, without murmurs, rubs, gallops. Lungs: Clear to auscultation bilaterally. Abdomen: Soft, nontender, nondistended, positive bowel sounds.   Pertinent Lab Results:none   Medications: Prior to Admission medications   Medication Sig Start Date End Date Taking? Authorizing Provider  acetaminophen (TYLENOL) 500 MG tablet Take 1,000 mg by mouth every 8 (eight) hours as needed for moderate pain.    Historical Provider, MD  acetaminophen-codeine (TYLENOL #3) 300-30 MG per tablet Take 1 tablet by mouth every 8 (eight) hours as needed for moderate pain. 01/23/15   Josalyn Funches,  MD  diphenoxylate-atropine (LOMOTIL) 2.5-0.025 MG per tablet Take 2 tablets by mouth 4 (four) times daily as needed for diarrhea or loose stools. 04/03/15   Brayton Caves, PA-C  gabapentin (NEURONTIN) 300 MG capsule 300 mg in morning, 300 mg in afternoon, 600 mg at night 01/23/15   Boykin Nearing, MD  HYDROcodone-acetaminophen (NORCO/VICODIN) 5-325 MG per tablet Take 1 tablet by mouth every 6 (six) hours as needed for moderate pain. 03/29/15   Dalia Heading, PA-C  ibuprofen (ADVIL,MOTRIN) 800 MG tablet Take 1 tablet (800 mg total) by mouth every 8 (eight) hours as needed. 03/29/15   Dalia Heading, PA-C  penicillin v potassium (VEETID) 500 MG tablet Take 1 tablet (500 mg total) by mouth 4 (four) times daily. 03/29/15   Dalia Heading, PA-C    Assessment: 1. Lower Abdominal Pain 2. Diarrhea ? 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 Adrian Blackwater in 2 weeks  The patient was given clear instructions to go to ER or return to medical center if symptoms don't improve, worsen or new problems develop. The patient verbalized understanding. The patient was told to call to get lab results if they haven't heard anything in the next week.   This note has been created with Surveyor, quantity. Any transcriptional errors are unintentional.   Zettie Pho, PA-C 04/03/2015, 12:24 PM

## 2015-04-03 NOTE — Patient Instructions (Signed)
Bowel rest-nothing to eat today Only drink water over next 2 days From tomorrow start a "bland" diet for 2 days If not better by Tues, please call us appt in 2 weeks with Funches if doing ok Out of work today and tomorrow

## 2015-04-04 ENCOUNTER — Telehealth: Payer: Self-pay | Admitting: Family Medicine

## 2015-04-04 NOTE — Telephone Encounter (Signed)
Pt is experiencing stomache pain, nausia and vomitting. She is worried that she will not be able to go in to work tomorrow and is wanting to speak to a nurse for advise.

## 2015-04-04 NOTE — Telephone Encounter (Signed)
Nurse called patient, patient verified date of birth. Patient reports diarrhea stopped but patient is still throwing up. Patient would like note for work tomorrow. Nurse explained patient would need to be seen again to get work note. Nurse offered to see if there are any appointments available next week but it would be providers decision to give work note for tomorrow at that appointment.  Nurse advised patient if she could not wait until next week or symptoms became worse to go to Urgent Care. Nurse gave patient Urgent Care hours, Monday-Sunday 1pm-8pm.  Patient advised nurse she will go to Urgent Care tomorrow.

## 2015-04-04 NOTE — Telephone Encounter (Signed)
Patient called returning nurse's call, please f/u °

## 2015-04-04 NOTE — Telephone Encounter (Signed)
Nurse returned call to patient. Reached voicemail, left message for patient to return call to [employer] with [facility] at [phone].

## 2015-04-14 ENCOUNTER — Encounter: Payer: Self-pay | Admitting: Family Medicine

## 2015-04-14 ENCOUNTER — Encounter: Payer: Self-pay | Admitting: Gastroenterology

## 2015-04-14 ENCOUNTER — Ambulatory Visit: Payer: Self-pay | Attending: Family Medicine | Admitting: Family Medicine

## 2015-04-14 VITALS — BP 106/68 | HR 91 | Temp 98.5°F | Resp 16 | Ht 61.5 in | Wt 166.0 lb

## 2015-04-14 DIAGNOSIS — R1032 Left lower quadrant pain: Secondary | ICD-10-CM | POA: Insufficient documentation

## 2015-04-14 DIAGNOSIS — M542 Cervicalgia: Secondary | ICD-10-CM

## 2015-04-14 DIAGNOSIS — M25532 Pain in left wrist: Secondary | ICD-10-CM | POA: Insufficient documentation

## 2015-04-14 DIAGNOSIS — R1030 Lower abdominal pain, unspecified: Secondary | ICD-10-CM | POA: Insufficient documentation

## 2015-04-14 DIAGNOSIS — M654 Radial styloid tenosynovitis [de Quervain]: Secondary | ICD-10-CM

## 2015-04-14 DIAGNOSIS — F17200 Nicotine dependence, unspecified, uncomplicated: Secondary | ICD-10-CM | POA: Insufficient documentation

## 2015-04-14 DIAGNOSIS — R1031 Right lower quadrant pain: Secondary | ICD-10-CM

## 2015-04-14 DIAGNOSIS — Z0000 Encounter for general adult medical examination without abnormal findings: Secondary | ICD-10-CM

## 2015-04-14 DIAGNOSIS — K921 Melena: Secondary | ICD-10-CM | POA: Insufficient documentation

## 2015-04-14 LAB — HEMOCCULT GUIAC POC 1CARD (OFFICE): FECAL OCCULT BLD: NEGATIVE

## 2015-04-14 LAB — POCT URINALYSIS DIPSTICK
Bilirubin, UA: NEGATIVE
GLUCOSE UA: NEGATIVE
Ketones, UA: NEGATIVE
Leukocytes, UA: NEGATIVE
Nitrite, UA: NEGATIVE
PROTEIN UA: NEGATIVE
Spec Grav, UA: 1.02
Urobilinogen, UA: 0.2
pH, UA: 7

## 2015-04-14 LAB — COMPLETE METABOLIC PANEL WITH GFR
ALBUMIN: 4.4 g/dL (ref 3.5–5.2)
ALK PHOS: 62 U/L (ref 39–117)
ALT: 10 U/L (ref 0–35)
AST: 17 U/L (ref 0–37)
BUN: 10 mg/dL (ref 6–23)
CHLORIDE: 106 meq/L (ref 96–112)
CO2: 26 meq/L (ref 19–32)
Calcium: 9.4 mg/dL (ref 8.4–10.5)
Creat: 0.88 mg/dL (ref 0.50–1.10)
GFR, Est African American: >89 mL/min
GFR, Est Non African American: 80 mL/min
GLUCOSE: 72 mg/dL (ref 70–99)
Potassium: 4.4 mEq/L (ref 3.5–5.3)
SODIUM: 141 meq/L (ref 135–145)
Total Bilirubin: 0.3 mg/dL (ref 0.2–1.2)
Total Protein: 7.3 g/dL (ref 6.0–8.3)

## 2015-04-14 MED ORDER — ACETAMINOPHEN-CODEINE #3 300-30 MG PO TABS: 1.0000 | ORAL_TABLET | Freq: Three times a day (TID) | ORAL | Status: DC | PRN

## 2015-04-14 MED ORDER — METHYLPREDNISOLONE ACETATE 40 MG/ML IJ SUSP
40.0000 mg | Freq: Once | INTRAMUSCULAR | Status: AC
  Administered 2015-04-14: 40 mg via INTRA_ARTICULAR

## 2015-04-14 NOTE — Progress Notes (Signed)
F/U low abdominal pain Stated yesterday had BM with blood  No problem with urination  Complaining of lt wrist pain x 4 wks

## 2015-04-14 NOTE — Assessment & Plan Note (Signed)
A: b/l lower abdominal pain with reported blood in stool. Heme negative stool today. DDx diverticulitis, constipation, viral colitis. P: UA CBC, CMP abdominal x-ray GI referral for colonoscopy

## 2015-04-14 NOTE — Assessment & Plan Note (Signed)
A; L De Quervain's tenosynovitis, no improvement with oral NSAID, compression or gabapentin P:  Steroid injection done today into 1st extensor compartment  Ice and compression

## 2015-04-14 NOTE — Patient Instructions (Addendum)
April Hudson,  Thank you for coming in today  1. L wrist Harriet Pho You have received a shot of steroid in your joint today. Rest and ice knee today. Regular activity tomorrow. Look out for redness, swelling, fever,severe pain in joint and call if you experience these symptoms.  2. Abdominal pain with blood in stool: No blood today CBC, CMP, UA today  Tylenol  Or tylenol #3 for pain Avoid ibuprofen given report of bleeding with stool  Abdominal x-ray GI referral placed  F/u in 3 weeks for abdominal pain  F/u 6 weeks for L wrist De Quervain's tenosynovitis   Dr. Adrian Blackwater

## 2015-04-14 NOTE — Progress Notes (Signed)
   Subjective:    Patient ID: April Hudson, female    DOB: Jan 25, 1971, 44 y.o.   MRN: [id number] CC: f/u lower abdominal pain and diarrhea  HPI 44 yo F with hx of carpal tunnel, smoker  1. Abdominal pain: improved. Had BM with blood yesterday. No diarrhea, fever, chills, nausea, emesis, weight loss. Does have lower abdominal pain that is worsened with eating. Has had blood in stool x 2 months now. Pain is worsening over the past 2 months.   2. L wrist pain: x 4 weeks. L radial wrist pain with mild swelling. No improvement with gabapentin. Evaluated by neurology last month, the impression was De Quervain's tenosynovitis. Patient has pain with thumb flexion that radiates proximally.   Soc Hx: current smoker  Review of Systems  Constitutional: Negative for fever and chills.  Gastrointestinal: Positive for abdominal pain, blood in stool and anal bleeding. Negative for nausea, vomiting, diarrhea, constipation, abdominal distention and rectal pain.  Musculoskeletal: Positive for joint swelling and arthralgias.  Skin: Negative for color change, rash and wound.       Objective:   Physical Exam BP 106/68 mmHg  Pulse 91  Temp(Src) 98.5 F (36.9 C) (Oral)  Resp 16  Ht 5' 1.5" (1.562 m)  Wt 166 lb (75.297 kg)  BMI 30.86 kg/m2  SpO2 99%  Wt Readings from Last 3 Encounters:  04/14/15 166 lb (75.297 kg)  04/03/15 164 lb (74.39 kg)  03/12/15 168 lb 2 oz (76.261 kg)  General appearance: alert, cooperative and no distress Throat: lips, mucosa, and tongue normal; teeth and gums normal Lungs: normal WOB  Abdomen: soft, round, mild TTP lower abdomen, no mass, no rebound or guarding   Rectal: healed surgical scar in gluteal cleft. External skin tags around rectum. Normal rectal tone. Soft stool in rectal vault. FOBT negative.  Extremities: mild edema and tenderness along L radial styloid. Positive finkelstein test.  Pulses: 2+ and symmetric Skin: Skin color, texture, turgor normal. No rashes  or lesions  After obtaining informed consent and cleaning the skin using iodine and alcohol a  steroid injection was performed at L fist extensor compartment using 1% plain Lidocaine and 40 mg of Depo Medrol. This was well tolerated      Assessment & Plan:

## 2015-04-15 ENCOUNTER — Encounter: Payer: Self-pay | Admitting: Family Medicine

## 2015-04-15 ENCOUNTER — Ambulatory Visit: Payer: Self-pay | Attending: Family Medicine | Admitting: Family Medicine

## 2015-04-15 DIAGNOSIS — M7989 Other specified soft tissue disorders: Secondary | ICD-10-CM | POA: Insufficient documentation

## 2015-04-15 DIAGNOSIS — F17200 Nicotine dependence, unspecified, uncomplicated: Secondary | ICD-10-CM | POA: Insufficient documentation

## 2015-04-15 DIAGNOSIS — M654 Radial styloid tenosynovitis [de Quervain]: Secondary | ICD-10-CM

## 2015-04-15 DIAGNOSIS — M659 Synovitis and tenosynovitis, unspecified: Secondary | ICD-10-CM | POA: Insufficient documentation

## 2015-04-15 LAB — CBC
HCT: 41.9 % (ref 36.0–46.0)
HEMOGLOBIN: 14.4 g/dL (ref 12.0–15.0)
MCH: 31.9 pg (ref 26.0–34.0)
MCHC: 34.4 g/dL (ref 30.0–36.0)
MCV: 92.7 fL (ref 78.0–100.0)
MPV: 9.7 fL (ref 8.6–12.4)
Platelets: 211 10*3/uL (ref 150–400)
RBC: 4.52 MIL/uL (ref 3.87–5.11)
RDW: 14 % (ref 11.5–15.5)
WBC: 8 10*3/uL (ref 4.0–10.5)

## 2015-04-15 MED ORDER — NAPROXEN 500 MG PO TABS: 500.0000 mg | ORAL_TABLET | Freq: Three times a day (TID) | ORAL | Status: DC

## 2015-04-15 NOTE — Assessment & Plan Note (Signed)
A: post injection reaction without evidence of infection P: Instructions: 1. Rest wrist for next 2 days, out of work letter provided 2. Take naproxen 3 times daily for next 3 days with food, then as needed with food up to three times a day 3. Ice for 20 minutes every 2-3 hrs   F/u in 2-3 days if redness or swelling worsens, otherwise keep 6 week follow up

## 2015-04-15 NOTE — Patient Instructions (Signed)
April Hudson,  Thank you for coming in today. I am sorry that you are having worsening pain and swelling after the steroid injection  Instructions: 1. Rest wrist for next 2 days, out of work letter provided 2. Take naproxen 3 times daily for next 3 days with food, then as needed with food up to three times a day 3. Ice for 20 minutes every 2-3 hrs   F/u in 2-3 days if redness or swelling worsens, otherwise keep 6 week follow up   Dr. Adrian Blackwater

## 2015-04-15 NOTE — Progress Notes (Signed)
Complaining of pain on inj site Site red and swelling

## 2015-04-15 NOTE — Progress Notes (Signed)
   Subjective:    Patient ID: April Hudson, female    DOB: 22-Sep-1971, 44 y.o.   MRN: [id number] CC: hand swelling  HPI 44 yo F with L De Quervain's tenosynovitis s/p corticosteroid steroid injection yesterday. Presents today with worsening pain and swelling, warmth and redness in her L hand. She denies fever. She has not taken any antiinflammatory.   Soc Hx: current smoker  Review of Systems  Musculoskeletal: Positive for joint swelling and arthralgias.      Objective:   Physical Exam BP 131/81 mmHg  Pulse 77  Temp(Src) 98.8 F (37.1 C) (Oral)  Resp 16  Ht 5' 1.5" (1.562 m)  Wt 167 lb (75.751 kg)  BMI 31.05 kg/m2  SpO2 98% General appearance: alert, cooperative and no distress Extremities: L radial wrist and thumb, mild erythema, TTP, no streaking or fluctuance.      Assessment & Plan:

## 2015-04-16 ENCOUNTER — Telehealth: Payer: Self-pay | Admitting: *Deleted

## 2015-04-16 NOTE — Telephone Encounter (Signed)
Pt aware of results 

## 2015-04-16 NOTE — Telephone Encounter (Signed)
-----   Message from Boykin Nearing, MD sent at 04/15/2015  9:05 AM EDT ----- Normal CBC and CMP

## 2015-04-17 NOTE — Telephone Encounter (Signed)
Pt calling to report that her hand is not improving. Despite following medication and care indications she is still experiencing a lot of pain and is unable to hold things in her hand. Pt is concerned because she is unable to resume her regular job function in her current state.  Please f/u with pt with further instructions.

## 2015-04-17 NOTE — Telephone Encounter (Signed)
Called back to patient. Left VM Asked for call back to schedule f/u appt for 2:15 slot next week Tuesday. Advised patient remain out of work until f/u appt.    Is she having any worsening of redness, swelling or fever concerning for joint infection? If so she needs to present to care ASAP, here, urgent care or ED.

## 2015-04-21 ENCOUNTER — Ambulatory Visit: Payer: Self-pay | Admitting: Family Medicine

## 2015-04-22 ENCOUNTER — Encounter: Payer: Self-pay | Admitting: Family Medicine

## 2015-04-22 ENCOUNTER — Ambulatory Visit: Payer: Self-pay | Attending: Family Medicine | Admitting: Family Medicine

## 2015-04-22 VITALS — BP 105/70 | HR 84 | Temp 98.3°F | Resp 16 | Ht 61.0 in | Wt 166.0 lb

## 2015-04-22 DIAGNOSIS — M25532 Pain in left wrist: Secondary | ICD-10-CM | POA: Insufficient documentation

## 2015-04-22 DIAGNOSIS — M654 Radial styloid tenosynovitis [de Quervain]: Secondary | ICD-10-CM

## 2015-04-22 DIAGNOSIS — R197 Diarrhea, unspecified: Secondary | ICD-10-CM | POA: Insufficient documentation

## 2015-04-22 DIAGNOSIS — F17210 Nicotine dependence, cigarettes, uncomplicated: Secondary | ICD-10-CM | POA: Insufficient documentation

## 2015-04-22 DIAGNOSIS — R109 Unspecified abdominal pain: Secondary | ICD-10-CM | POA: Insufficient documentation

## 2015-04-22 LAB — COMPLETE METABOLIC PANEL WITH GFR
ALT: 13 U/L (ref 0–35)
AST: 18 U/L (ref 0–37)
Albumin: 4.6 g/dL (ref 3.5–5.2)
Alkaline Phosphatase: 64 U/L (ref 39–117)
BUN: 11 mg/dL (ref 6–23)
CO2: 26 meq/L (ref 19–32)
CREATININE: 0.83 mg/dL (ref 0.50–1.10)
Calcium: 10.6 mg/dL — ABNORMAL HIGH (ref 8.4–10.5)
Chloride: 104 mEq/L (ref 96–112)
GFR, EST NON AFRICAN AMERICAN: 85 mL/min
GFR, Est African American: >89 mL/min
GLUCOSE: 84 mg/dL (ref 70–99)
Potassium: 4.6 mEq/L (ref 3.5–5.3)
Sodium: 142 mEq/L (ref 135–145)
TOTAL PROTEIN: 8.1 g/dL (ref 6.0–8.3)
Total Bilirubin: 0.3 mg/dL (ref 0.2–1.2)

## 2015-04-22 LAB — LIPASE: LIPASE: 50 U/L (ref 0–75)

## 2015-04-22 MED ORDER — DIPHENOXYLATE-ATROPINE 2.5-0.025 MG PO TABS: 1.0000 | ORAL_TABLET | Freq: Four times a day (QID) | ORAL | Status: DC | PRN

## 2015-04-22 NOTE — Assessment & Plan Note (Signed)
Wrist pain: Sports medicine referral Continue brace Please go for wrist x-ray  Letter provided I encourage you to apply for short term disability

## 2015-04-22 NOTE — Patient Instructions (Addendum)
Mrs. April Hudson,  Thank you for coming in today  1. Wrist pain: Sports medicine referral Continue brace Please go for wrist x-ray  Letter provided I encourage you to apply for short term disability  2. Diarrhea and abdominal pain: Take a break from gabapentin to see if diarrhea improves Start back on lomotil as needed  Plenty of fluids   F/u in 4 weeks  Dr. Adrian Blackwater

## 2015-04-22 NOTE — Progress Notes (Signed)
   Subjective:    Patient ID: April Hudson, female    DOB: 1971-07-18, 44 y.o.   MRN: [id number] CC; L wrist pain HPI 44 yo F with carpal tunnel and De Quervain's tenosynovitis presents for follow up  1. L wrist pain: pain worsened after steroid injection with swelling and itching in joint. No improved. Still swollen. Tender. Pain with suppination and pronation. 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   2. Diarrhea and abdominal pain: no fever. No sick contacts. Pain after eating. No chills. Patient denies ETOH.   History   Social History  . Marital Status: Married    Spouse Name: N/A  . Number of Children: N/A  . Years of Education: N/A   Social History Main Topics  . Smoking status: Current Every Day Smoker -- 1.00 packs/day for 30 years    Types: Cigarettes  . Smokeless tobacco: Never Used  . Alcohol Use: No  . Drug Use: No  . Sexual Activity: Not on file   Other Topics Concern  . None   Social History Narrative   Lives with husband in a one story home.     Has 6 children (2 biological).     Works at [employer].  Education: GED    Soc Hx: current smoker  Review of Systems  Gastrointestinal: Positive for abdominal pain and diarrhea. Negative for nausea, vomiting, constipation, blood in stool, abdominal distention, anal bleeding and rectal pain.  Musculoskeletal: Positive for joint swelling and arthralgias.      Objective:   Physical Exam BP 105/70 mmHg  Pulse 84  Temp(Src) 98.3 F (36.8 C) (Oral)  Resp 16  Ht 5\' 1"  (1.549 m)  Wt 166 lb (75.297 kg)  BMI 31.38 kg/m2  SpO2 99% General appearance: alert, cooperative and no distress  Abdomen: round, soft, mild TTP in umbilical area  Extremities: L radial wrist swelling with scabs overlying injection site     Assessment & Plan:

## 2015-04-22 NOTE — Assessment & Plan Note (Signed)
Diarrhea and abdominal pain: Take a break from gabapentin to see if diarrhea improves Start back on lomotil as needed  Plenty of fluids

## 2015-04-22 NOTE — Progress Notes (Signed)
F/U Wrist pain Complaining of diarrhea and abdominal pain

## 2015-04-23 ENCOUNTER — Telehealth: Payer: Self-pay | Admitting: *Deleted

## 2015-04-23 NOTE — Telephone Encounter (Signed)
Pt aware of results 

## 2015-04-23 NOTE — Telephone Encounter (Signed)
-----   Message from Boykin Nearing, MD sent at 04/23/2015 10:01 AM EDT ----- All labs normal CMP normal and lipase normal

## 2015-04-29 ENCOUNTER — Other Ambulatory Visit: Payer: Self-pay | Admitting: Family Medicine

## 2015-04-29 ENCOUNTER — Ambulatory Visit (HOSPITAL_COMMUNITY)
Admission: RE | Admit: 2015-04-29 | Discharge: 2015-04-29 | Disposition: A | Discharge status: Home / Self Care | Payer: Self-pay | Source: Ambulatory Visit | Attending: Family Medicine | Admitting: Family Medicine

## 2015-04-29 DIAGNOSIS — R1031 Right lower quadrant pain: Secondary | ICD-10-CM

## 2015-04-29 DIAGNOSIS — R1030 Lower abdominal pain, unspecified: Secondary | ICD-10-CM | POA: Insufficient documentation

## 2015-04-29 DIAGNOSIS — R112 Nausea with vomiting, unspecified: Secondary | ICD-10-CM | POA: Insufficient documentation

## 2015-04-29 DIAGNOSIS — M25532 Pain in left wrist: Secondary | ICD-10-CM | POA: Insufficient documentation

## 2015-04-29 DIAGNOSIS — R1032 Left lower quadrant pain: Secondary | ICD-10-CM

## 2015-04-29 DIAGNOSIS — M654 Radial styloid tenosynovitis [de Quervain]: Secondary | ICD-10-CM

## 2015-04-29 DIAGNOSIS — M19032 Primary osteoarthritis, left wrist: Secondary | ICD-10-CM

## 2015-04-29 NOTE — Assessment & Plan Note (Signed)
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

## 2015-04-30 ENCOUNTER — Telehealth: Payer: Self-pay | Admitting: *Deleted

## 2015-04-30 NOTE — Telephone Encounter (Signed)
-----   Message from Boykin Nearing, MD sent at 04/29/2015 10:08 AM EDT ----- Mild amount of stool in colon otherwise normal abdominal x-ray

## 2015-04-30 NOTE — Telephone Encounter (Signed)
Pt aware of results 

## 2015-04-30 NOTE — Telephone Encounter (Signed)
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

## 2015-05-06 ENCOUNTER — Telehealth: Payer: Self-pay | Admitting: Family Medicine

## 2015-05-06 ENCOUNTER — Ambulatory Visit: Payer: Self-pay | Admitting: Family Medicine

## 2015-05-06 NOTE — Telephone Encounter (Signed)
Patient called stating that she has a rash on her back, arm and breast. Patient stated that it started last week and took Benadryl which helped for about 6 hours but then the itching came back. Patient would like advice on what she can do to treat. Please f/u with pt.

## 2015-05-14 NOTE — Telephone Encounter (Signed)
Returned patient's call States itchy rash persists on back, arm and breast Transferred call to scheduler for first available appt with PCP or PA

## 2015-05-21 ENCOUNTER — Emergency Department (HOSPITAL_COMMUNITY): Payer: Self-pay

## 2015-05-21 ENCOUNTER — Encounter (HOSPITAL_COMMUNITY): Payer: Self-pay | Admitting: *Deleted

## 2015-05-21 DIAGNOSIS — Z8719 Personal history of other diseases of the digestive system: Secondary | ICD-10-CM | POA: Insufficient documentation

## 2015-05-21 DIAGNOSIS — R0789 Other chest pain: Secondary | ICD-10-CM | POA: Insufficient documentation

## 2015-05-21 DIAGNOSIS — G43909 Migraine, unspecified, not intractable, without status migrainosus: Secondary | ICD-10-CM | POA: Insufficient documentation

## 2015-05-21 DIAGNOSIS — Z862 Personal history of diseases of the blood and blood-forming organs and certain disorders involving the immune mechanism: Secondary | ICD-10-CM | POA: Insufficient documentation

## 2015-05-21 DIAGNOSIS — R61 Generalized hyperhidrosis: Secondary | ICD-10-CM | POA: Insufficient documentation

## 2015-05-21 DIAGNOSIS — J45901 Unspecified asthma with (acute) exacerbation: Secondary | ICD-10-CM | POA: Insufficient documentation

## 2015-05-21 DIAGNOSIS — Z87440 Personal history of urinary (tract) infections: Secondary | ICD-10-CM | POA: Insufficient documentation

## 2015-05-21 DIAGNOSIS — Z791 Long term (current) use of non-steroidal anti-inflammatories (NSAID): Secondary | ICD-10-CM | POA: Insufficient documentation

## 2015-05-21 DIAGNOSIS — Z79891 Long term (current) use of opiate analgesic: Secondary | ICD-10-CM | POA: Insufficient documentation

## 2015-05-21 DIAGNOSIS — Z720 Tobacco use: Secondary | ICD-10-CM | POA: Insufficient documentation

## 2015-05-21 DIAGNOSIS — R42 Dizziness and giddiness: Secondary | ICD-10-CM | POA: Insufficient documentation

## 2015-05-21 DIAGNOSIS — Z79899 Other long term (current) drug therapy: Secondary | ICD-10-CM | POA: Insufficient documentation

## 2015-05-21 LAB — CBC
HEMATOCRIT: 42.6 % (ref 36.0–46.0)
HEMOGLOBIN: 14.9 g/dL (ref 12.0–15.0)
MCH: 32.5 pg (ref 26.0–34.0)
MCHC: 35 g/dL (ref 30.0–36.0)
MCV: 93 fL (ref 78.0–100.0)
Platelets: 193 10*3/uL (ref 150–400)
RBC: 4.58 MIL/uL (ref 3.87–5.11)
RDW: 13.8 % (ref 11.5–15.5)
WBC: 8.5 10*3/uL (ref 4.0–10.5)

## 2015-05-21 LAB — BASIC METABOLIC PANEL
Anion gap: 8 (ref 5–15)
BUN: 12 mg/dL (ref 6–20)
CO2: 26 mmol/L (ref 22–32)
Calcium: 9.5 mg/dL (ref 8.9–10.3)
Chloride: 103 mmol/L (ref 101–111)
Creatinine, Ser: 0.93 mg/dL (ref 0.44–1.00)
GFR calc Af Amer: >60 mL/min (ref >60)
GFR calc non Af Amer: >60 mL/min (ref >60)
Glucose, Bld: 103 mg/dL — ABNORMAL HIGH (ref 65–99)
Potassium: 3.6 mmol/L (ref 3.5–5.1)
Sodium: 137 mmol/L (ref 135–145)

## 2015-05-21 LAB — TROPONIN I: Troponin I: <0.03 ng/mL (ref <0.031)

## 2015-05-21 NOTE — ED Notes (Signed)
The pt is c/o her entire lt side of her chest cramping since Sunday with dizziness and nausea.Marland Kitchen lmp none

## 2015-05-22 ENCOUNTER — Emergency Department (HOSPITAL_COMMUNITY)
Admission: EM | Admit: 2015-05-22 | Discharge: 2015-05-22 | Disposition: A | Discharge status: Home / Self Care | Payer: Self-pay | Attending: Emergency Medicine | Admitting: Emergency Medicine

## 2015-05-22 DIAGNOSIS — R0789 Other chest pain: Secondary | ICD-10-CM

## 2015-05-22 LAB — I-STAT TROPONIN, ED: Troponin i, poc: 0 ng/mL (ref 0.00–0.08)

## 2015-05-22 MED ORDER — HYDROCODONE-ACETAMINOPHEN 5-325 MG PO TABS: 1.0000 | ORAL_TABLET | ORAL | Status: DC | PRN

## 2015-05-22 MED ORDER — IBUPROFEN 800 MG PO TABS
800.0000 mg | ORAL_TABLET | Freq: Once | ORAL | Status: AC
  Administered 2015-05-22: 800 mg via ORAL
  Filled 2015-05-22: qty 1

## 2015-05-22 MED ORDER — HYDROCODONE-ACETAMINOPHEN 5-325 MG PO TABS
2.0000 | ORAL_TABLET | Freq: Once | ORAL | Status: AC
  Administered 2015-05-22: 2 via ORAL
  Filled 2015-05-22: qty 2

## 2015-05-22 MED ORDER — IBUPROFEN 800 MG PO TABS: 800.0000 mg | ORAL_TABLET | Freq: Three times a day (TID) | ORAL | Status: DC | PRN

## 2015-05-22 NOTE — ED Provider Notes (Signed)
This chart was scribed for Nelson, DO by Irene Pap, ED Scribe. This patient was seen in room A07C/A07C and patient care was started at 12:20 AM.   TIME SEEN: 12:20 AM  CHIEF COMPLAINT: chest pain  HPI:  April Hudson is a 44 y.o. female with hx of asthma and smoking who presents to the Emergency Department complaining of left sided, cramping, intermittent chest pain onset 4 days ago. States that she came in today because the pain felt like it is right under her heart. Pt states that the pain worsens with movement and palpation and relieves with resting. Reports associated SOB and productive cough with yellow white sputum. States that she will get dizzy if she gets up too quickly.  Denies fever, chills, nausea, vomiting, diarrhea, or new diaphoresis (states that she regularly gets hot flashes). Denies hx of DM, COPD, HTN, CHF, or high cholesterol. Reports family hx of heart disease in mother in her 26s.  No premature CAD.  ROS: See HPI Constitutional: no fever  Eyes: no drainage  ENT: no runny nose   Cardiovascular:  chest pain  Resp:  SOB  GI: no vomiting GU: no dysuria Integumentary: no rash  Allergy: no hives  Musculoskeletal: no leg swelling  Neurological: no slurred speech ROS otherwise negative  PAST MEDICAL HISTORY/PAST SURGICAL HISTORY:  Past Medical History  Diagnosis Date  . History of frequent urinary tract infections   . Smoker   . Joint pain   . History of gastric ulcer     clinical diagnosis, no prior EGD  . Migraines Dx 2013  . Anemia     since at least 2013  . Allergy   . GERD (gastroesophageal reflux disease) Dx 2015  . Asthma     as a child    MEDICATIONS:  Prior to Admission medications   Medication Sig Start Date End Date Taking? Authorizing Provider  acetaminophen (TYLENOL) 500 MG tablet Take 1,000 mg by mouth every 8 (eight) hours as needed for moderate pain.    Historical Provider, MD  acetaminophen-codeine (TYLENOL #3) 300-30 MG per  tablet Take 1 tablet by mouth every 8 (eight) hours as needed for moderate pain. 04/14/15   Josalyn Funches, MD  diphenoxylate-atropine (LOMOTIL) 2.5-0.025 MG per tablet Take 1 tablet by mouth 4 (four) times daily as needed for diarrhea or loose stools. 04/22/15   Josalyn Funches, MD  gabapentin (NEURONTIN) 300 MG capsule 300 mg in morning, 300 mg in afternoon, 600 mg at night 01/23/15   Josalyn Funches, MD  naproxen (NAPROSYN) 500 MG tablet Take 1 tablet (500 mg total) by mouth 3 (three) times daily with meals. 04/15/15   Boykin Nearing, MD    ALLERGIES:  Allergies  Allergen Reactions  . Sulfa Antibiotics Hives    SOCIAL HISTORY:  Social History  Substance Use Topics  . Smoking status: Current Every Day Smoker -- 1.00 packs/day for 30 years    Types: Cigarettes  . Smokeless tobacco: Never Used  . Alcohol Use: No    FAMILY HISTORY: Family History  Problem Relation Age of Onset  . Diabetes Mother   . Hypertension Mother   . Cancer Maternal Uncle     stomach  . Heart disease Sister   . Healthy Brother   . Asthma Son   . Asthma Daughter     EXAM: BP 108/74 mmHg  Pulse 76  Temp(Src) 98.2 F (36.8 C) (Oral)  Resp 16  Ht 5\' 1"  (1.549 m)  Wt 165 lb (  74.844 kg)  BMI 31.19 kg/m2  SpO2 100%  CONSTITUTIONAL: Alert and oriented and responds appropriately to questions. Well-appearing; well-nourished HEAD: Normocephalic EYES: Conjunctivae clear, PERRL ENT: normal nose; no rhinorrhea; moist mucous membranes; pharynx without lesions noted NECK: Supple, no meningismus, no LAD  CARD: RRR; S1 and S2 appreciated; no murmurs, no clicks, no rubs, no gallops CHEST: tender to palpation to the left chest wall without crepitus, ecchymosis, bony deformity or any other associated lesions RESP: Normal chest excursion without splinting or tachypnea; breath sounds clear and equal bilaterally; no wheezes, no rhonchi, no rales, no hypoxia or respiratory distress, speaking full sentences ABD/GI:  Normal bowel sounds; non-distended; soft, non-tender, no rebound, no guarding, no peritoneal signs BACK:  The back appears normal and is non-tender to palpation, there is no CVA tenderness EXT: Normal ROM in all joints; non-tender to palpation; no edema; normal capillary refill; no cyanosis, no calf tenderness or swelling    SKIN: Normal color for age and race; warm; slightly diaphoretic NEURO: Moves all extremities equally, sensation to light touch intact diffusely, cranial nerves II through XII intact PSYCH: The patient's mood and manner are appropriate. Grooming and personal hygiene are appropriate.  MEDICAL DECISION MAKING: Patient here with atypical chest pain, likely chest wall pain. She is slightly diaphoretic but states that she has this frequently with hot flashes that she is menopausal. Pain is reproducible with palpation of her chest and is not exertional or pleuritic. EKG shows no ischemic changes and first troponin is negative. Chest x-ray clear. Plan is to repeat second troponin and treat symptomatically with ibuprofen and Vicodin. Second troponin negative, patient is feeling better and vital signs remained stable, will discharge home with outpatient follow-up. She is comfortable with this plan.  ED PROGRESS: Patient's pain is improved. Second troponin is negative. I feel she is safe to be discharged home. Discussed usual and customary return precautions. She verbalized understanding and is comfortable with plan. We'll discharge with ibuprofen, Vicodin.   EKG Interpretation  Date/Time:  Wednesday May 21 2015 22:34:37 EDT Ventricular Rate:  81 PR Interval:  158 QRS Duration: 86 QT Interval:  364 QTC Calculation: 422 R Axis:   63 Text Interpretation:  Normal sinus rhythm Normal ECG No significant change since last tracing Confirmed by Milan Perkins,  DO, Vann Okerlund ([id number]) on 05/22/2015 12:24:20 AM         I personally performed the services described in this documentation, which was  scribed in my presence. The recorded information has been reviewed and is accurate.    [place], DO 05/22/15 [phone]

## 2015-05-22 NOTE — ED Notes (Signed)
Pt reports left sided chest pain since Sunday that feels like a cramping. Pt states she tried "mustard and baking soda" with no relief.

## 2015-05-22 NOTE — ED Notes (Signed)
Dr. Ward at bedside.

## 2015-05-22 NOTE — Discharge Instructions (Signed)

## 2015-05-22 NOTE — ED Notes (Signed)
Pt acknowledges she is taking all of her belongings home.

## 2015-05-26 ENCOUNTER — Ambulatory Visit: Payer: Self-pay | Attending: Family Medicine | Admitting: Family Medicine

## 2015-05-26 ENCOUNTER — Encounter: Payer: Self-pay | Admitting: Family Medicine

## 2015-05-26 VITALS — BP 104/77 | HR 92 | Temp 98.4°F | Resp 16 | Ht 61.5 in | Wt 167.0 lb

## 2015-05-26 DIAGNOSIS — R0782 Intercostal pain: Secondary | ICD-10-CM

## 2015-05-26 DIAGNOSIS — B353 Tinea pedis: Secondary | ICD-10-CM

## 2015-05-26 DIAGNOSIS — M7989 Other specified soft tissue disorders: Secondary | ICD-10-CM

## 2015-05-26 DIAGNOSIS — M79645 Pain in left finger(s): Secondary | ICD-10-CM

## 2015-05-26 DIAGNOSIS — Z114 Encounter for screening for human immunodeficiency virus [HIV]: Secondary | ICD-10-CM

## 2015-05-26 DIAGNOSIS — R079 Chest pain, unspecified: Secondary | ICD-10-CM | POA: Insufficient documentation

## 2015-05-26 MED ORDER — DOXYCYCLINE HYCLATE 100 MG PO TABS: 100.0000 mg | ORAL_TABLET | Freq: Two times a day (BID) | ORAL | Status: DC

## 2015-05-26 MED ORDER — TERBINAFINE HCL 1 % EX CREA: 1.0000 "application " | TOPICAL_CREAM | Freq: Two times a day (BID) | CUTANEOUS | Status: DC

## 2015-05-26 NOTE — Progress Notes (Signed)
   Subjective:    Patient ID: April Hudson, female    DOB: 1971/07/10, 44 y.o.   MRN: [id number] CC: rash, myalgias,   HPI 44 yo F smoker  1. Rash: pruritic rash on chest around bra line for 3 weeks. Also have rash on feet that has been present longer. Noticed redness on swelling along L thumb started last night.  2. Swelling under R axilla: x 1 week. No lesions on R arm or hand. Tender swelling. No fever or chills. No breast pain.   3. Cramping: on R and L side. Comes and goes. Worse at night. Went to ED. Dx with muscle spasm. Patient is smoking. No coughing or CP or SOB.   Social History  Substance Use Topics  . Smoking status: Current Every Day Smoker -- 1.00 packs/day for 30 years    Types: Cigarettes  . Smokeless tobacco: Never Used  . Alcohol Use: No    Review of Systems  Constitutional: Negative for fever and chills.  Eyes: Negative for visual disturbance.  Respiratory: Negative for shortness of breath.   Cardiovascular: Negative for chest pain.  Gastrointestinal: Negative for abdominal pain and blood in stool.  Musculoskeletal: Positive for myalgias and arthralgias. Negative for back pain.  Skin: Positive for rash.  Allergic/Immunologic: Negative for immunocompromised state.  Hematological: Positive for adenopathy. Does not bruise/bleed easily.  Psychiatric/Behavioral: Negative for suicidal ideas and dysphoric mood.       Objective:   Physical Exam  Constitutional: She is oriented to person, place, and time. She appears well-developed and well-nourished. No distress.  Eyes: Conjunctivae are normal. Pupils are equal, round, and reactive to light.  Cardiovascular: Normal rate, regular rhythm, normal heart sounds and intact distal pulses.   Pulmonary/Chest: Effort normal and breath sounds normal.  Musculoskeletal: She exhibits no edema.  Neurological: She is alert and oriented to person, place, and time.  Skin: Skin is warm and dry. Rash noted. There is erythema.    R tender papules under R axilla x 2 lesions   Xerotic skin Hyperkeratotic papules along bra line   Erythema and edema along extensor surface of L thumb    Psychiatric: She has a normal mood and affect.  BP 104/77 mmHg  Pulse 92  Temp(Src) 98.4 F (36.9 C) (Oral)  Resp 16  Ht 5' 1.5" (1.562 m)  Wt 167 lb (75.751 kg)  BMI 31.05 kg/m2  SpO2 7%        Assessment & Plan:

## 2015-05-26 NOTE — Progress Notes (Signed)
Rash on feet, rt breast. Itching no pain. Lt wrist pain and swelling not changes since last visit.

## 2015-05-26 NOTE — Assessment & Plan Note (Signed)
Foot rash: tinea pedis Foot fungus lamisil cream until rash heals

## 2015-05-26 NOTE — Patient Instructions (Addendum)
April Hudson  Thank you for coming in today  1. Foot rash: tinea pedis Foot fungus lamisil cream until rash heals   2. Swelling under R arm and rash of L thumb: Unclear cause CBC with diff Doxy twice daily for 10 days  Call for fever, worsening swelling or pain in your breast  3. Chest cramping:  Checking CMP and mag  F/u in 3 weeks to recheck for R under arm swelling and L thumb redness and swelling  Dr. Adrian Blackwater

## 2015-05-26 NOTE — Assessment & Plan Note (Signed)
Chest cramping: muscle pain suspected  Checking CMP and mag

## 2015-05-26 NOTE — Assessment & Plan Note (Signed)
Swelling under R arm and rash of L thumb: Unclear cause CBC with diff Doxy twice daily for 10 days  Call for fever, worsening swelling or pain in your bre

## 2015-05-27 ENCOUNTER — Ambulatory Visit: Payer: Self-pay | Attending: Family Medicine

## 2015-05-27 LAB — CBC WITH DIFFERENTIAL/PLATELET
Basophils Absolute: 0.1 10*3/uL (ref 0.0–0.1)
Basophils Relative: 1 % (ref 0–1)
EOS ABS: 0.2 10*3/uL (ref 0.0–0.7)
Eosinophils Relative: 2 % (ref 0–5)
HEMATOCRIT: 45.1 % (ref 36.0–46.0)
Hemoglobin: 15.5 g/dL — ABNORMAL HIGH (ref 12.0–15.0)
LYMPHS ABS: 3.4 10*3/uL (ref 0.7–4.0)
Lymphocytes Relative: 44 % (ref 12–46)
MCH: 31.8 pg (ref 26.0–34.0)
MCHC: 34.4 g/dL (ref 30.0–36.0)
MCV: 92.4 fL (ref 78.0–100.0)
MONOS PCT: 5 % (ref 3–12)
MPV: 9.8 fL (ref 8.6–12.4)
Monocytes Absolute: 0.4 10*3/uL (ref 0.1–1.0)
NEUTROS ABS: 3.7 10*3/uL (ref 1.7–7.7)
Neutrophils Relative %: 48 % (ref 43–77)
Platelets: 228 10*3/uL (ref 150–400)
RBC: 4.88 MIL/uL (ref 3.87–5.11)
RDW: 14.2 % (ref 11.5–15.5)
WBC: 7.8 10*3/uL (ref 4.0–10.5)

## 2015-05-27 LAB — COMPLETE METABOLIC PANEL WITH GFR
ALBUMIN: 4.4 g/dL (ref 3.6–5.1)
ALK PHOS: 69 U/L (ref 33–115)
ALT: 11 U/L (ref 6–29)
AST: 18 U/L (ref 10–35)
BUN: 8 mg/dL (ref 7–25)
CO2: 25 mmol/L (ref 20–31)
Calcium: 9.7 mg/dL (ref 8.6–10.2)
Chloride: 105 mmol/L (ref 98–110)
Creat: 0.77 mg/dL (ref 0.50–1.10)
GFR, Est African American: >89 mL/min (ref >=60)
GFR, Est Non African American: >89 mL/min (ref >=60)
GLUCOSE: 114 mg/dL — AB (ref 65–99)
POTASSIUM: 5.4 mmol/L — AB (ref 3.5–5.3)
SODIUM: 145 mmol/L (ref 135–146)
TOTAL PROTEIN: 7.3 g/dL (ref 6.1–8.1)
Total Bilirubin: 0.3 mg/dL (ref 0.2–1.2)

## 2015-05-27 LAB — MAGNESIUM: Magnesium: 2 mg/dL (ref 1.5–2.5)

## 2015-05-28 ENCOUNTER — Telehealth: Payer: Self-pay | Admitting: *Deleted

## 2015-05-28 LAB — HIV ANTIBODY (ROUTINE TESTING W REFLEX): HIV 1&2 Ab, 4th Generation: NONREACTIVE

## 2015-05-28 NOTE — Telephone Encounter (Signed)
-----   Message from Boykin Nearing, MD sent at 05/28/2015  8:32 AM EDT ----- Slightly elevated potassium and Hgb, otherwise normal labs

## 2015-05-28 NOTE — Telephone Encounter (Signed)
Pt aware of results  Date of birth verified Pt verbalized understanding

## 2015-05-30 ENCOUNTER — Encounter: Payer: Self-pay | Admitting: Family Medicine

## 2015-05-30 ENCOUNTER — Ambulatory Visit (INDEPENDENT_AMBULATORY_CARE_PROVIDER_SITE_OTHER): Payer: Self-pay | Admitting: Family Medicine

## 2015-05-30 VITALS — BP 111/76 | Ht 61.0 in | Wt 167.0 lb

## 2015-05-30 DIAGNOSIS — M654 Radial styloid tenosynovitis [de Quervain]: Secondary | ICD-10-CM

## 2015-05-30 MED ORDER — METHYLPREDNISOLONE ACETATE 40 MG/ML IJ SUSP
40.0000 mg | Freq: Once | INTRAMUSCULAR | Status: AC
  Administered 2015-05-30: 40 mg via INTRA_ARTICULAR

## 2015-05-30 MED ORDER — MELOXICAM 15 MG PO TABS: 15.0000 mg | ORAL_TABLET | Freq: Every day | ORAL | Status: DC

## 2015-05-30 NOTE — Progress Notes (Signed)
  April Hudson - 44 y.o. female MRN [id number]  Date of birth: [birth year] April Hudson is a 44 y.o. female who presents today for left wrist pain.  Left wrist pain, initial visit-patient presents today for initial visit referred by her PCP Dr. function is for left wrist tenosynovium right wrist. This is been ongoing now for about 4 months and is worse at the end of her work shift. She is a employee at [employer] who does a lot of lifting activities and noticed that a change in her job that involved carrying a new tasks out around 4 months ago exacerbated this problem. Pain is localized over the radial styloid without a previous injury noted. Pain is worse with thumb flexion. She has not tried anything other than ibuprofen and 1 previous injection was not done ultrasound guidance. She did not have relief from the previous injection. Denies any paresthesias going into her fingers or frank weakness. She is right hand dominant.  PMHx - Updated and reviewed.  Contributory factors include: Osteoarthritis, carpal tunnel syndrome, current smoker PSHx - Updated and reviewed.  Contributory factors include:  Noncontributory FHx - Updated and reviewed.  Contributory factors include:  Noncontributory Medications - Mobic 15 mg daily   ROS Per HPI   Exam:  Filed Vitals:   05/30/15 0925  BP: 111/76   Gen: NAD Cardiorespiratory - Normal respiratory effort/rate.  RRR Wrist: Inspection normal with no visible erythema or swelling. ROM smooth and normal with good flexion and extension and ulnar/radial deviation that is symmetrical with opposite wrist. Palpation is normal over metacarpals, navicular, lunate, and TFCC; tendons without tenderness/ swelling Strength 5/5 in all directions without pain. + Finkelstein, Negative tinel's and phalens.  Imaging:  Ultrasound and long and short axis of the left wrist shows anechoic focal fluid of the first compartment around the radial styloid. The tendon sheath  including the fibrous compartment is inflamed and focally tender.

## 2015-05-30 NOTE — Assessment & Plan Note (Signed)
Patient with signs/symptoms of de Quervain's. Pain is worse at the radial styloid and thumb flexion/Finkelstein's test. -We will start her in a thumb restriction splint along with Modic 50 mg daily. -Injection today done under ultrasound guidance. -She will follow-up in 4-6 weeks to see how she is doing.   Aspiration/Injection Procedure Note April Hudson [birth year]  Procedure: Injection Indications: Pain  Procedure Details Consent: Risks of procedure as well as the alternatives and risks of each were explained to the (patient/caregiver).  Consent for procedure obtained. Time Out: Verified patient identification, verified procedure, site/side was marked, verified correct patient position, special equipment/implants available, medications/allergies/relevent history reviewed, required imaging and test results available.  Performed.  The area was cleaned with iodine and alcohol swabs.    The L dorsum 1st compartment was injected using 1 cc's of 40mg  Depomedrol and 1 cc's of 1% lidocaine with a 21 1 1/2" needle.  Ultrasound was used. Images were obtained in Transverse and Long views showing the injection.    A sterile dressing was applied.  Patient did tolerate procedure well. Estimated blood loss: N/A

## 2015-05-30 NOTE — Progress Notes (Signed)
Patient ID: April Hudson, female   DOB: 08-Sep-1971, 44 y.o.   MRN: [id number] [facility]: Attending Note: I have reviewed the chart, discussed wit the Sports Medicine Fellow. I agree with assessment and treatment plan as detailed in the [place] note.

## 2015-06-05 ENCOUNTER — Encounter: Payer: Self-pay | Admitting: Family Medicine

## 2015-06-05 ENCOUNTER — Ambulatory Visit (INDEPENDENT_AMBULATORY_CARE_PROVIDER_SITE_OTHER): Payer: Self-pay | Admitting: Family Medicine

## 2015-06-05 VITALS — BP 106/82 | Ht 61.0 in | Wt 167.0 lb

## 2015-06-05 DIAGNOSIS — M654 Radial styloid tenosynovitis [de Quervain]: Secondary | ICD-10-CM

## 2015-06-05 MED ORDER — NYSTATIN-TRIAMCINOLONE 100000-0.1 UNIT/GM-% EX OINT: 1.0000 "application " | TOPICAL_OINTMENT | Freq: Two times a day (BID) | CUTANEOUS | Status: DC

## 2015-06-05 NOTE — Patient Instructions (Signed)
Come out of the splint while you sleep if you can Also, definitely come out three times a day and do the exercises I have circled for you They may not be comfortable to do, and may even cause a little pain, but you are not causing any injury, you are doing appropriate rehabilitation  For the dark circle on your hand, I am calling in some cream for you--I think that is a ringworm (tinea) infection. Ice at least once a day, continue the meloxicam. See me back in 3 weeks or so and certainly call if anything new happens or if you have questions.

## 2015-06-05 NOTE — Progress Notes (Signed)
Patient ID: April Hudson, female   DOB: 09-17-1971, 44 y.o.   MRN: [id number]  CC: f/u for De Quervain's tenosynovitis injection  HPI: 44 year old lady who was here 6 days ago for [employer] injection States that her pain has gotten worse and she has had some swelling and pressure in the thumb area radiating up to the mid forearm No redness, but pt feels swelling and pressure in the wrist since 4 days ago Pt states her fingers are "numbish and tinglish". Feels weakness in the hand 2/2 pain. Notes difficulty with holding things in the hand. Has been getting worse Pain is worse with moving the thumb or pushing on the thumb, but notes that there is a constant dull ache No fevers, chills, vomiting. Pt feels hot and has nausea with reduced appetite.  States she has been wearing the brace all the time except in the shower.  States she just got her meloxicam yesterday, took it once, has helped  Exam: BP 106/82 mmHg  Ht 5\' 1"  (1.549 m)  Wt 167 lb (75.751 kg)  BMI 31.57 kg/m2 General: NAD, comfortable, conversant Resp: normal WOB.  L Arm:  Inspection: very slight swelling over the dorsal surface of the left wrist. Visible hyperpigmented circular area on dorsal wrist. Palpation: TTP over thumb medial wrist, and up to mid forearm over brachioradialis. PROM: Normal PROM, worse pain with flexion of wrist.  AROM:  Normal AROM, worse pain with flexion of wrist, but pain with extension, abduction, adduction. Strength: 5/5 stength in wrist flexion, extension, abduction, adduction, however, slightly reduced in relation to R side d/t pain. Sensation: Pt states she is numb over the medial thumb, reduced sensation in relation to R on hand and forearm.  MSK [place]: Pt with mild inflammation and some fluid around APL and EPB tendons.  Assessment:  44 year old lady who was here 6 days ago for [employer] tenosynovitis injection, here for worsening pain, feeling of swelling, numbness and  tingling in the past 4 days.  Based on exam, it is likely that pt is having some refractory inflammation that may improve with time.   PLAN:  Will give exercises and recommend coming out of the brace to do these 3x daily, as well as a night Recommend continuing meloxicam for pain Recommend icing 2-3x daily for 10 mins Recommend applying antifungal cream daily to tinea Follow up in 3 weeks  Waynard Reeds, MD Pt seen and examined with Dr. Nori Riis.

## 2015-06-10 NOTE — Progress Notes (Signed)
Patient ID: April Hudson, female   DOB: [birth year], 44 y.o.   MRN: [id number] [place] Attending Note: I have seen and examined this patient. I have discussed this patient with the resident and reviewed the assessment and plan as documented above. I agree with the resident's findings and plan.  I thik she was just worried because sheis still having pain. I will have her come out of universal thumb splint 3 times a day and start some ROM and mild strengthening exercises and out of brace for sleep. Sheis getting stiff and this makes her pain seem worse. Lengthy discussion with her and her husband about long course usually necessary for total resolution. I did [place] again and essenstialy unchanged. On her skin ov wrist dorsum, small hyperpigmented area with serpiginous border, itchy. C/w tinea. She had previously been rx some lamisil butdoes not seem like she was able to pick that up so will rx something a little cheaper. She also has healing scraped area om dorsum of wrist, said she pulled scab off recently. It looks likeit is healing well, over 2nd compartment, just some reddish new skin, fully epithelialized, about 2-3 mm X 5-6 mm. I will see her backin 3-4 weeks and advance her rehab program

## 2015-06-16 ENCOUNTER — Telehealth: Payer: Self-pay | Admitting: Family Medicine

## 2015-06-16 ENCOUNTER — Encounter: Payer: Self-pay | Admitting: Gastroenterology

## 2015-06-16 ENCOUNTER — Encounter: Payer: Self-pay | Admitting: *Deleted

## 2015-06-16 ENCOUNTER — Ambulatory Visit (INDEPENDENT_AMBULATORY_CARE_PROVIDER_SITE_OTHER): Payer: Self-pay | Admitting: Gastroenterology

## 2015-06-16 VITALS — BP 116/80 | HR 84 | Ht 60.24 in | Wt 166.1 lb

## 2015-06-16 DIAGNOSIS — R197 Diarrhea, unspecified: Secondary | ICD-10-CM

## 2015-06-16 DIAGNOSIS — A09 Infectious gastroenteritis and colitis, unspecified: Secondary | ICD-10-CM

## 2015-06-16 MED ORDER — NA SULFATE-K SULFATE-MG SULF 17.5-3.13-1.6 GM/177ML PO SOLN: ORAL | Status: DC

## 2015-06-16 NOTE — Progress Notes (Signed)
HPI: This is a   very pleasant 44 year old woman   who was referred to me by Boykin Nearing, MD  to evaluate  abdominal pain, bloody diarrhea .    Chief complaint is abdominal pain and bloody diarrhea  She is here with her husband today  Has had bright red rectal bleeding from rectum since July.  SHe has minor perianal pains.  2 months.  3 days out of the week.  Diarrhea for about the same time as well.  Diarrhea 5 days out the week, 3 times per day.    Has gained about 40 pounds in the past year.  Her uncle had colon cancer.   No colitis or crohn's   Has been having left lower abd pains for same time course.  Pains are crampy. She is somewhat tender to touch.  Has been cutting down on cigs lately.  No NSAIDs   Labs 05/2015: cbc, cmet were both essentially normal; stool hemocult testing (04/2015) was negative    Review of systems: Pertinent positive and negative review of systems were noted in the above HPI section. Complete review of systems was performed and was otherwise normal.   Past Medical History  Diagnosis Date  . History of frequent urinary tract infections   . Smoker   . Joint pain   . History of gastric ulcer     clinical diagnosis, no prior EGD  . Migraines Dx 2013  . Anemia     since at least 2013  . Allergy   . GERD (gastroesophageal reflux disease) Dx 2015  . Asthma     as a child    Past Surgical History  Procedure Laterality Date  . Knee surgery    . Vaginal hysterectomy  2006    for fibroids and heavy menstrual bleeding     Current Outpatient Prescriptions  Medication Sig Dispense Refill  . doxycycline (VIBRA-TABS) 100 MG tablet Take 1 tablet (100 mg total) by mouth 2 (two) times daily. 20 tablet 0  . HYDROcodone-acetaminophen (NORCO/VICODIN) 5-325 MG per tablet Take 1 tablet by mouth every 4 (four) hours as needed. 15 tablet 0  . ibuprofen (ADVIL,MOTRIN) 800 MG tablet Take 1 tablet (800 mg total) by mouth every 8 (eight) hours as needed for  mild pain. 30 tablet 0  . meloxicam (MOBIC) 15 MG tablet Take 1 tablet (15 mg total) by mouth daily. 30 tablet 1  . nystatin-triamcinolone ointment (MYCOLOG) Apply 1 application topically 2 (two) times daily. 30 g 0  . terbinafine (LAMISIL AT) 1 % cream Apply 1 application topically 2 (two) times daily. 30 g 0   No current facility-administered medications for this visit.    Allergies as of 06/16/2015 - Review Complete 06/16/2015  Allergen Reaction Noted  . Sulfa antibiotics Hives 07/03/2014    Family History  Problem Relation Age of Onset  . Diabetes Mother   . Hypertension Mother   . Stomach cancer Maternal Uncle   . Heart disease Sister   . Healthy Brother   . Asthma Son   . Asthma Daughter   . Colon cancer Maternal Uncle   . Ovarian cancer Mother     Social History   Social History  . Marital Status: Married    Spouse Name: N/A  . Number of Children: 2  . Years of Education: N/A   Occupational History  . Not on file.   Social History Main Topics  . Smoking status: Current Every Day Smoker -- 1.00 packs/day for 30  years    Types: Cigarettes  . Smokeless tobacco: Never Used     Comment: info given 06-16-15  . Alcohol Use: No  . Drug Use: No  . Sexual Activity: Not on file   Other Topics Concern  . Not on file   Social History Narrative   Lives with husband in a one story home.     Has 6 children (2 biological).     Works at [employer].  Education: GED     Physical Exam: Ht 5' 0.24" (1.53 m) Constitutional: generally well-appearing Psychiatric: alert and oriented x3 Eyes: extraocular movements intact Mouth: oral pharynx moist, no lesions Neck: supple no lymphadenopathy Cardiovascular: heart regular rate and rhythm Lungs: clear to auscultation bilaterally Abdomen: soft, nontender, nondistended, no obvious ascites, no peritoneal signs, normal bowel sounds Extremities: no lower extremity edema bilaterally Skin: no lesions on visible  extremities   Assessment and plan: 44 y.o. female with  left lower quadrant abdominal pain, bloody diarrhea; suspicious for left-sided colitis clinically  Her history is suspicious for ulcerative colitis and I recommended we proceed with colonoscopy at her soonest convenience to try to prove this diagnosis.  she is able to come tomorrow and I see no reason for any further blood tests or imaging studies prior to that.   [name], MD [facility] 06/16/2015, 1:21 PM  Cc: Boykin Nearing, MD

## 2015-06-16 NOTE — Telephone Encounter (Signed)
Patient called requesting a letter for her employer stating that she is currently on leave due to her wrist injury. Patient was advised of 7 to 14 business day turn around time for letter if PCP was willing to write it. Please f/u

## 2015-06-16 NOTE — Patient Instructions (Addendum)
One of your biggest health concerns is your smoking.  This increases your risk for most cancers and serious cardiovascular diseases such as strokes, heart attacks.  You should try your best to stop.  If you need assistance, please contact your PCP or Smoking Cessation Class at [facility] [phone]) or [place] (1-800-QUIT-NOW). You have been given a separate informational sheet regarding your tobacco use, the importance of quitting and local resources to help you quit.  You will be set up for a colonoscopy (tomorrow 9:30?) for bloody diarrhea, left sided abdominal pain.

## 2015-06-17 ENCOUNTER — Encounter: Payer: Self-pay | Admitting: Gastroenterology

## 2015-06-17 ENCOUNTER — Ambulatory Visit (AMBULATORY_SURGERY_CENTER): Payer: Self-pay | Admitting: Gastroenterology

## 2015-06-17 VITALS — BP 119/72 | HR 77 | Temp 97.6°F | Resp 16 | Ht 60.0 in | Wt 166.0 lb

## 2015-06-17 DIAGNOSIS — R197 Diarrhea, unspecified: Secondary | ICD-10-CM

## 2015-06-17 DIAGNOSIS — D128 Benign neoplasm of rectum: Secondary | ICD-10-CM

## 2015-06-17 DIAGNOSIS — D124 Benign neoplasm of descending colon: Secondary | ICD-10-CM

## 2015-06-17 DIAGNOSIS — A09 Infectious gastroenteritis and colitis, unspecified: Secondary | ICD-10-CM

## 2015-06-17 MED ORDER — SODIUM CHLORIDE 0.9 % IV SOLN: 500.0000 mL | INTRAVENOUS | Status: DC

## 2015-06-17 NOTE — Patient Instructions (Addendum)
YOU HAD AN ENDOSCOPIC PROCEDURE TODAY AT [facility]:   Refer to the procedure report that was given to you for any specific questions about what was found during the examination.  If the procedure report does not answer your questions, please call your gastroenterologist to clarify.  If you requested that your care partner not be given the details of your procedure findings, then the procedure report has been included in a sealed envelope for you to review at your convenience later.  YOU SHOULD EXPECT: Some feelings of bloating in the abdomen. Passage of more gas than usual.  Walking can help get rid of the air that was put into your GI tract during the procedure and reduce the bloating. If you had a lower endoscopy (such as a colonoscopy or flexible sigmoidoscopy) you may notice spotting of blood in your stool or on the toilet paper. If you underwent a bowel prep for your procedure, you may not have a normal bowel movement for a few days.  Please Note:  You might notice some irritation and congestion in your nose or some drainage.  This is from the oxygen used during your procedure.  There is no need for concern and it should clear up in a day or so.  SYMPTOMS TO REPORT IMMEDIATELY:   Following lower endoscopy (colonoscopy or flexible sigmoidoscopy):  Excessive amounts of blood in the stool  Significant tenderness or worsening of abdominal pains  Swelling of the abdomen that is new, acute  Fever of 100F or higher    For urgent or emergent issues, a gastroenterologist can be reached at any hour by calling [phone].   DIET: Your first meal following the procedure should be a small meal and then it is ok to progress to your normal diet. Heavy or fried foods are harder to digest and may make you feel nauseous or bloated.  Likewise, meals heavy in dairy and vegetables can increase bloating.  Drink plenty of fluids but you should avoid alcoholic beverages for 24  hours.  ACTIVITY:  You should plan to take it easy for the rest of today and you should NOT DRIVE or use heavy machinery until tomorrow (because of the sedation medicines used during the test).    FOLLOW UP: Our staff will call the number listed on your records the next business day following your procedure to check on you and address any questions or concerns that you may have regarding the information given to you following your procedure. If we do not reach you, we will leave a message.  However, if you are feeling well and you are not experiencing any problems, there is no need to return our call.  We will assume that you have returned to your regular daily activities without incident.  If any biopsies were taken you will be contacted by phone or by letter within the next 1-3 weeks.  Please call us at [phone] if you have not heard about the biopsies in 3 weeks.    SIGNATURES/CONFIDENTIALITY: You and/or your care partner have signed paperwork which will be entered into your electronic medical record.  These signatures attest to the fact that that the information above on your After Visit Summary has been reviewed and is understood.  Full responsibility of the confidentiality of this discharge information lies with you and/or your care-partner.   Avoid NSAIDS and aspirin for 2 weeks,but resume remainder of medications. Information given on polyps,diverticulosis and high fiber diet.

## 2015-06-17 NOTE — Op Note (Addendum)
[place]  [address] [place], [id number]   COLONOSCOPY PROCEDURE REPORT  PATIENT: April Hudson, April Hudson  MR#: [id number] BIRTHDATE: 02-May-1971 , 27  yrs. old GENDER: female ENDOSCOPIST: Milus Banister, MD REFERRED GU:RKYHCWC Funches, MD PROCEDURE DATE:  06/17/2015 PROCEDURE:   Colonoscopy, diagnostic, Colonoscopy with biopsy, Colonoscopy with snare polypectomy, and Submucosal injection, any substance First Screening Colonoscopy - Avg.  risk and is 50 yrs.  old or older - No.  Prior Negative Screening - Now for repeat screening. N/A  History of Adenoma - Now for follow-up colonoscopy & has been > or = to 3 yrs.  N/A  Polyps removed today? Yes ASA CLASS:   Class II INDICATIONS:change in bowels, bloody stools. MEDICATIONS: Monitored anesthesia care and Propofol 500 mg IV  DESCRIPTION OF PROCEDURE:   After the risks benefits and alternatives of the procedure were thoroughly explained, informed consent was obtained.  The digital rectal exam revealed no abnormalities of the rectum.   The LB [id number]  endoscope was introduced through the anus and advanced to the cecum, which was identified by both the appendix and ileocecal valve. No adverse events experienced.   The quality of the prep was excellent.  The instrument was then slowly withdrawn as the colon was fully examined. Estimated blood loss is zero unless otherwise noted in this procedure report.   COLON FINDINGS: A sessile polyp measuring 4 mm in size was found in the descending colon.  A polypectomy was performed with a cold snare.  The resection was complete, the polyp tissue was completely retrieved and sent to histology (jar 2).   A semi-pedunculated polyp measuring 40 mm in size was found in the rectum.  This was soft.  A polypectomy was performed in a piecemeal fashion using snare cautery.  The resection was complete, the polyp tissue was completely retrieved and sent to histology (jar 3) .   The site (9cm from anal verge) was labeled with SPOT following polyp removal. The colon was randomly biopsied and sent to pathology (jar 1). 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   The examination was otherwise normal.  Retroflexed views revealed no abnormalities. The time to cecum = 2.8 Withdrawal time = 22.8   The scope was withdrawn and the procedure completed. COMPLICATIONS: There were no immediate complications.  ENDOSCOPIC IMPRESSION: 1.   Small sessile polyp was found in the descending colon; polypectomy was performed with a cold snare 2.   Large semi-pedunculated polyp was found in the rectum; polypectomy was performed in a piecemeal fashion using snare cautery and the site was labeled with spot following resection. This was located 9cm from anal verge. 3.   The colon was randomly biopsied and sent to pathology. 4.   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   The examination was otherwise normal  RECOMMENDATIONS: Await pathology results. Avoid NSAIDs and aspirin for 2 weeks.  eSigned:  Milus Banister, MD 06/17/2015 10:00 AM Revised: 06/17/2015 10:00 AM    PATIENT NAME:  April Hudson, April Hudson MR#: [id number]

## 2015-06-17 NOTE — Progress Notes (Signed)
Transferred to recovery room. A/O x3, pleased with MAC.  VSS.  Report to Shelia, RN. 

## 2015-06-17 NOTE — Progress Notes (Signed)
Called to room to assist during endoscopic procedure.  Patient ID and intended procedure confirmed with present staff. Received instructions for my participation in the procedure from the performing physician.  

## 2015-06-18 ENCOUNTER — Ambulatory Visit: Payer: Self-pay | Admitting: Family Medicine

## 2015-06-18 ENCOUNTER — Telehealth: Payer: Self-pay

## 2015-06-18 NOTE — Telephone Encounter (Signed)
Letter written and ready for pick up Patient will be reassessed before 07/19/15 to determine if additional time off is warranted.  She should make a follow up appt with me prior to 07/19/15

## 2015-06-18 NOTE — Telephone Encounter (Signed)
  Follow up Call-  Call back number 06/17/2015  Post procedure Call Back phone  # [phone]  Permission to leave phone message Yes     Patient questions:  Do you have a fever, pain , or abdominal swelling? No. Pain Score  6 *  Have you tolerated food without any problems? Yes.    Have you been able to return to your normal activities? Yes.    Do you have any questions about your discharge instructions: Diet   No. Medications  No. Follow up visit  No.  Do you have questions or concerns about your Care? Yes.    Actions: * If pain score is 4 or above: Physician/ provider Notified : [name], MD Ms. Sproule is having pain and bloating in her lower abdominal area. She rated her pain level a 6 and is having difficulty passing gas. Advised can take tylenol for pain and try different positions to expel trapped air. She would like a call back concerning her pain and gas.

## 2015-06-18 NOTE — Telephone Encounter (Signed)
Pt notified letter at front office ready to be pick up

## 2015-06-20 ENCOUNTER — Ambulatory Visit: Payer: Self-pay | Admitting: Family Medicine

## 2015-06-20 NOTE — Telephone Encounter (Signed)
Pt states her bottom was stinging some but it is getting better. States her stomach is hurting as it was prior to her colon but states is hurts a little worse. No bleeding noted by pt.

## 2015-06-20 NOTE — Telephone Encounter (Signed)
Patty, Can you please call to see how she is doing.  Was a pretty large polyp.  thanks

## 2015-06-24 ENCOUNTER — Other Ambulatory Visit: Payer: Self-pay

## 2015-06-24 ENCOUNTER — Ambulatory Visit (INDEPENDENT_AMBULATORY_CARE_PROVIDER_SITE_OTHER)
Admission: RE | Admit: 2015-06-24 | Discharge: 2015-06-24 | Disposition: A | Discharge status: Home / Self Care | Payer: Self-pay | Source: Ambulatory Visit | Attending: Gastroenterology | Admitting: Gastroenterology

## 2015-06-24 ENCOUNTER — Other Ambulatory Visit (INDEPENDENT_AMBULATORY_CARE_PROVIDER_SITE_OTHER): Payer: Self-pay

## 2015-06-24 DIAGNOSIS — R1084 Generalized abdominal pain: Secondary | ICD-10-CM

## 2015-06-24 LAB — CBC WITH DIFFERENTIAL/PLATELET
Basophils Absolute: 0.1 10*3/uL (ref 0.0–0.1)
Basophils Relative: 1.2 % (ref 0.0–3.0)
EOS ABS: 0.2 10*3/uL (ref 0.0–0.7)
EOS PCT: 2 % (ref 0.0–5.0)
HCT: 43.7 % (ref 36.0–46.0)
Hemoglobin: 14.6 g/dL (ref 12.0–15.0)
LYMPHS ABS: 3.3 10*3/uL (ref 0.7–4.0)
Lymphocytes Relative: 37.5 % (ref 12.0–46.0)
MCHC: 33.3 g/dL (ref 30.0–36.0)
MCV: 95.1 fl (ref 78.0–100.0)
MONO ABS: 0.6 10*3/uL (ref 0.1–1.0)
Monocytes Relative: 6.7 % (ref 3.0–12.0)
NEUTROS PCT: 52.6 % (ref 43.0–77.0)
Neutro Abs: 4.6 10*3/uL (ref 1.4–7.7)
Platelets: 198 10*3/uL (ref 150.0–400.0)
RBC: 4.59 Mil/uL (ref 3.87–5.11)
RDW: 14.2 % (ref 11.5–15.5)
WBC: 8.8 10*3/uL (ref 4.0–10.5)

## 2015-06-25 ENCOUNTER — Telehealth: Payer: Self-pay | Admitting: Gastroenterology

## 2015-06-25 NOTE — Telephone Encounter (Signed)
Spoke with pt and she is aware.

## 2015-06-25 NOTE — Telephone Encounter (Signed)
Pt calling for xray results from yesterday. Please advise.

## 2015-06-25 NOTE — Telephone Encounter (Signed)
I spoke with her on the phone yesterday, told her that to me the xray was normal and I would contact her if radiology reading was different.  The radiology reading was not different and so I did not contact her (as we had discussed).  Can you please contact her, the xray was normal.

## 2015-06-27 ENCOUNTER — Ambulatory Visit (INDEPENDENT_AMBULATORY_CARE_PROVIDER_SITE_OTHER): Payer: Self-pay | Admitting: Family Medicine

## 2015-06-27 ENCOUNTER — Encounter: Payer: Self-pay | Admitting: Family Medicine

## 2015-06-27 VITALS — BP 117/78 | HR 83 | Ht 61.0 in | Wt 166.0 lb

## 2015-06-27 DIAGNOSIS — R1032 Left lower quadrant pain: Secondary | ICD-10-CM

## 2015-06-27 DIAGNOSIS — K621 Rectal polyp: Secondary | ICD-10-CM

## 2015-06-27 DIAGNOSIS — R1031 Right lower quadrant pain: Secondary | ICD-10-CM

## 2015-06-27 DIAGNOSIS — M654 Radial styloid tenosynovitis [de Quervain]: Secondary | ICD-10-CM

## 2015-06-27 NOTE — Assessment & Plan Note (Signed)
Brief discussion about abdominal pain. Does seem to be more pelvic. Status post hysterectomy. Unclear if she had oophorectomy. I wonder she has adhesions or scar tissue, might benefit from pelvic MRI.

## 2015-06-27 NOTE — Progress Notes (Signed)
   Subjective:    Patient ID: April Hudson, female    DOB: [birth year], 44 y.o.   MRN: [id number]  HPI Follow-up de Quervain's of left wrist. No improvement. In fact it seems to be worse. She had corticosteroid injection previously and that has not seemed to help. She's a little frustrated. #2. Area of rash on the dorsum of her wrist still present, not as pruritic. She's been using the copy in. #3. Recent colonoscopy showing rectal polyp with focal high-grade dysplasia. Follow-up with GI. Next #4. Continues to have bilateral lower abdominal pain. Not sure what she should do about this as the GI doctor did not seem to think it was related to her colon. She has had hysterectomy. Unclear if she had bilateral oophorectomy   Review of Systems No fever, sweats, chills. See history of present illness. Additionally, no history of numbness in the left forearm or tingling. She does have some mid forearm tenderness.    Objective:   Physical Exam Vital signs are reviewed GEN.: Well-developed female no acute distress ELBOW: Left. Full range of motion. Mild tenderness to  palpation in the extensor muscle mass at the origin on the lateral epicondyle and mild tenderness extending about 3 cm into the muscle mass of the forearm. WRIST: Left. Full range of motion flexion extension. There some soft tissue swelling notable. The thenar eminence is also mildly swollen. She has full range of motion in all planes of the thumb. Grip strength is impaired by pain with movement but otherwise full. SKIN: Area over the dorsum of the wrist previously diagnosed as a tinea now has some hypopigmented areas, less scale. VASCULAR Radial pulses 2+ bilaterally equal NEURO: Soft touch sensation intact bilateral hands as is 2. discrimination.       Assessment & Plan:

## 2015-06-27 NOTE — Assessment & Plan Note (Signed)
Recent diagnosis. She has follow-up with GI.

## 2015-06-27 NOTE — Assessment & Plan Note (Signed)
No improvement since last office visit, increased swelling. She's been trying to do exercises but they are becoming more painful. Intermittently wearing the splint, not sure if it helps. We'll get MRI is not following normal course. Follow up after MRI.

## 2015-07-04 ENCOUNTER — Ambulatory Visit (HOSPITAL_COMMUNITY): Admission: RE | Admit: 2015-07-04 | Payer: Self-pay | Source: Ambulatory Visit

## 2015-07-10 ENCOUNTER — Encounter: Payer: Self-pay | Admitting: Family Medicine

## 2015-07-10 ENCOUNTER — Ambulatory Visit: Payer: Self-pay | Attending: Family Medicine | Admitting: Family Medicine

## 2015-07-10 VITALS — BP 112/78 | HR 90 | Temp 98.8°F | Resp 16 | Ht 61.0 in | Wt 167.0 lb

## 2015-07-10 DIAGNOSIS — Z90710 Acquired absence of both cervix and uterus: Secondary | ICD-10-CM | POA: Insufficient documentation

## 2015-07-10 DIAGNOSIS — Z79899 Other long term (current) drug therapy: Secondary | ICD-10-CM | POA: Insufficient documentation

## 2015-07-10 DIAGNOSIS — F17210 Nicotine dependence, cigarettes, uncomplicated: Secondary | ICD-10-CM | POA: Insufficient documentation

## 2015-07-10 DIAGNOSIS — R1031 Right lower quadrant pain: Secondary | ICD-10-CM | POA: Insufficient documentation

## 2015-07-10 DIAGNOSIS — R1032 Left lower quadrant pain: Secondary | ICD-10-CM | POA: Insufficient documentation

## 2015-07-10 DIAGNOSIS — R102 Pelvic and perineal pain: Secondary | ICD-10-CM | POA: Insufficient documentation

## 2015-07-10 DIAGNOSIS — R110 Nausea: Secondary | ICD-10-CM | POA: Insufficient documentation

## 2015-07-10 MED ORDER — ONDANSETRON 4 MG PO TBDP: 4.0000 mg | ORAL_TABLET | Freq: Three times a day (TID) | ORAL | Status: DC | PRN

## 2015-07-10 MED ORDER — ACETAMINOPHEN-CODEINE #3 300-30 MG PO TABS
1.0000 | ORAL_TABLET | Freq: Three times a day (TID) | ORAL | Status: DC | PRN
Start: 2015-07-10 — End: 2015-10-23

## 2015-07-10 NOTE — Progress Notes (Signed)
Patient ID: April Hudson, female   DOB: [birth year], 44 y.o.   MRN: [id number]   Subjective:  Patient ID: April Hudson, female    DOB: 06-May-1971  Age: 44 y.o. MRN: [id number]  CC: Abdominal Pain and Nausea   [name] presents for   1. Lower abdominal pain: pain x 2 months . Pain is lower abdomen. Pain is associated with nausea. No emesis. Pain is exacerbated by eating. She has a colonoscopy on 06/17/2015. She has biopsies done. She will need f/u flex sigmoidoscopy in 6 months. She has normal abdominal plain film and CBC with diff on 06/24/15. No fever, chills or weight loss. She is s/p hysterectomy done at [facility] in [place], [place].   Diagnosis 1. Colon, polyp(s), random sites - UNREMARKABLE COLONIC MUCOSA. - NO MICROSCOPIC COLITIS, ACTIVE INFLAMMATION OR GRANULOMAS. 2. Colon, polyp(s), descending, polyp - TUBULAR ADENOMA. NO HIGH GRADE DYSPLASIA OR MALIGNANCY IDENTIFIED. 3. 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 - NO INVASIVE CARCINOMA IDENTIFIED.   Social History  Substance Use Topics  . Smoking status: Current Every Day Smoker -- 1.00 packs/day for 30 years    Types: Cigarettes  . Smokeless tobacco: Never Used     Comment: info given 06-16-15  . Alcohol Use: No   Outpatient Prescriptions Prior to Visit  Medication Sig Dispense Refill  . doxycycline (VIBRA-TABS) 100 MG tablet Take 1 tablet (100 mg total) by mouth 2 (two) times daily. 20 tablet 0  . meloxicam (MOBIC) 15 MG tablet Take 1 tablet (15 mg total) by mouth daily. 30 tablet 1  . nystatin-triamcinolone ointment (MYCOLOG) Apply 1 application topically 2 (two) times daily. 30 g 0  . HYDROcodone-acetaminophen (NORCO/VICODIN) 5-325 MG per tablet Take 1 tablet by mouth every 4 (four) hours as needed. (Patient not taking: Reported on 07/10/2015) 15 tablet 0  . ibuprofen (ADVIL,MOTRIN) 800 MG tablet Take 1 tablet (800 mg total) by mouth every  8 (eight) hours as needed for mild pain. (Patient not taking: Reported on 07/10/2015) 30 tablet 0  . Na Sulfate-K Sulfate-Mg Sulf SOLN Please take as directed for colonoscopy (Patient not taking: Reported on 07/10/2015) 354 mL 0  . terbinafine (LAMISIL AT) 1 % cream Apply 1 application topically 2 (two) times daily. (Patient not taking: Reported on 07/10/2015) 30 g 0   No facility-administered medications prior to visit.    ROS Review of Systems  Constitutional: Negative for fever, chills and unexpected weight change.  Eyes: Negative for visual disturbance.  Respiratory: Negative for shortness of breath.   Cardiovascular: Negative for chest pain.  Gastrointestinal: Positive for nausea. Negative for vomiting, abdominal pain, diarrhea, constipation, blood in stool, abdominal distention, anal bleeding and rectal pain.  Genitourinary: Positive for pelvic pain. Negative for vaginal bleeding, vaginal discharge and vaginal pain.  Musculoskeletal: Negative for back pain and arthralgias.  Skin: Negative for rash.  Allergic/Immunologic: Negative for immunocompromised state.  Hematological: Negative for adenopathy. Does not bruise/bleed easily.  Psychiatric/Behavioral: Negative for suicidal ideas and dysphoric mood.    Objective:  BP 112/78 mmHg  Pulse 90  Temp(Src) 98.8 F (37.1 C) (Oral)  Resp 16  Ht 5\' 1"  (1.549 m)  Wt 167 lb (75.751 kg)  BMI 31.57 kg/m2  SpO2 100%  Wt Readings from Last 3 Encounters:  07/10/15 167 lb (75.751 kg)  06/27/15 166 lb (75.297 kg)  06/17/15 166 lb (75.297 kg)    BP/Weight 07/10/2015 06/27/2015 3/84/6659  Systolic BP 935 701 779  Diastolic BP 78 78 72  Wt. (Lbs) 167 166 166  BMI 31.57 31.38 32.42    Physical Exam  Constitutional: She is oriented to person, place, and time. She appears well-developed and well-nourished. No distress.  HENT:  Head: Normocephalic and atraumatic.  Pulmonary/Chest: Effort normal.  Abdominal: Soft. Bowel sounds are normal.  She exhibits no distension and no mass. There is tenderness (b/l LQ ). There is no rebound and no guarding.  Genitourinary: Vagina normal. Pelvic exam was performed with patient prone. There is no rash, tenderness or lesion on the right labia. There is no rash, tenderness or lesion on the left labia. Cervix exhibits no motion tenderness. Right adnexum displays tenderness. Right adnexum displays no mass and no fullness. Left adnexum displays no mass, no tenderness and no fullness. No vaginal discharge found.  Uterus is surgically absent There is R adnexal tenderness   Musculoskeletal: She exhibits no edema.  Lymphadenopathy:       Right: No inguinal adenopathy present.       Left: No inguinal adenopathy present.  Neurological: She is alert and oriented to person, place, and time.  Skin: Skin is warm and dry. No rash noted.  Psychiatric: She has a normal mood and affect.     Assessment & Plan:   Problem List Items Addressed This Visit    Pelvic pain in female - Primary    A; pelvic/lower abdominal pain R>L. Etiology unclear. Chronic nature of pain argues against acute inflammatory process. Pain related to adhesions is possible. Will rule out adnexal mass as etiology.  P: Tylenol #3 for pain zofran for nausea Pelvic MRI Patient signed release so I can obtain records from [facility].       Relevant Medications   acetaminophen-codeine (TYLENOL #3) 300-30 MG tablet   ondansetron (ZOFRAN ODT) 4 MG disintegrating tablet   Other Relevant Orders   MR Pelvis Wo Contrast      Meds ordered this encounter  Medications  . acetaminophen-codeine (TYLENOL #3) 300-30 MG tablet    Sig: Take 1 tablet by mouth every 8 (eight) hours as needed.    Dispense:  60 tablet    Refill:  0  . ondansetron (ZOFRAN ODT) 4 MG disintegrating tablet    Sig: Take 1 tablet (4 mg total) by mouth every 8 (eight) hours as needed for nausea or vomiting.    Dispense:  20 tablet    Refill:  0    Follow-up: No Follow-up on  file.   Boykin Nearing MD

## 2015-07-10 NOTE — Patient Instructions (Addendum)
April Hudson was seen today for abdominal pain and nausea.  Diagnoses and all orders for this visit:  Pelvic pain in female -     MR Pelvis Wo Contrast; Future -     acetaminophen-codeine (TYLENOL #3) 300-30 MG tablet; Take 1 tablet by mouth every 8 (eight) hours as needed. -     ondansetron (ZOFRAN ODT) 4 MG disintegrating tablet; Take 1 tablet (4 mg total) by mouth every 8 (eight) hours as needed for nausea or vomiting.   F/u in 4-6 weeks for f./u pelvic pain   Dr. Adrian Blackwater

## 2015-07-10 NOTE — Progress Notes (Signed)
F/U low Abdominal pain  Nausea no vomiting  Pain scale #7 Stated abdominal pain worsen after meals  Hx tobacco 1 pper2 days

## 2015-07-10 NOTE — Assessment & Plan Note (Addendum)
A; pelvic/lower abdominal pain R>L. Etiology unclear. Chronic nature of pain argues against acute inflammatory process. Pain related to adhesions is possible. Will rule out adnexal mass as etiology.  P: Tylenol #3 for pain zofran for nausea Pelvic MRI Patient signed release so I can obtain records from [facility].

## 2015-07-16 ENCOUNTER — Ambulatory Visit (HOSPITAL_COMMUNITY): Payer: Self-pay

## 2015-07-23 ENCOUNTER — Ambulatory Visit (HOSPITAL_COMMUNITY)
Admission: RE | Admit: 2015-07-23 | Discharge: 2015-07-23 | Disposition: A | Discharge status: Home / Self Care | Payer: Self-pay | Source: Ambulatory Visit | Attending: Family Medicine | Admitting: Family Medicine

## 2015-07-23 ENCOUNTER — Ambulatory Visit (HOSPITAL_COMMUNITY): Admission: RE | Admit: 2015-07-23 | Payer: Self-pay | Source: Ambulatory Visit

## 2015-07-23 DIAGNOSIS — Z90710 Acquired absence of both cervix and uterus: Secondary | ICD-10-CM | POA: Insufficient documentation

## 2015-07-23 DIAGNOSIS — R937 Abnormal findings on diagnostic imaging of other parts of musculoskeletal system: Secondary | ICD-10-CM | POA: Insufficient documentation

## 2015-07-23 DIAGNOSIS — M654 Radial styloid tenosynovitis [de Quervain]: Secondary | ICD-10-CM

## 2015-07-23 DIAGNOSIS — M19032 Primary osteoarthritis, left wrist: Secondary | ICD-10-CM | POA: Insufficient documentation

## 2015-07-23 DIAGNOSIS — R188 Other ascites: Secondary | ICD-10-CM | POA: Insufficient documentation

## 2015-07-23 DIAGNOSIS — K4090 Unilateral inguinal hernia, without obstruction or gangrene, not specified as recurrent: Secondary | ICD-10-CM | POA: Insufficient documentation

## 2015-07-23 DIAGNOSIS — R102 Pelvic and perineal pain: Secondary | ICD-10-CM | POA: Insufficient documentation

## 2015-07-25 ENCOUNTER — Encounter: Payer: Self-pay | Admitting: Family Medicine

## 2015-07-25 ENCOUNTER — Telehealth: Payer: Self-pay | Admitting: Family Medicine

## 2015-07-25 NOTE — Telephone Encounter (Signed)
Rhea I look at her MRI of her wrist. 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 I did not expect that. Most of her issue was with the tendon. The tendon actually looks pretty good. I would recommend she come in to see me or one of [place] so we can sit down and talk about options. Options are pretty limited in many of options would involve surgery. I think continuing to wear the splint is probably a good idea. THANKS! Dorcas Mcmurray

## 2015-07-28 ENCOUNTER — Other Ambulatory Visit (HOSPITAL_COMMUNITY): Payer: Self-pay

## 2015-07-29 ENCOUNTER — Telehealth: Payer: Self-pay | Admitting: Family Medicine

## 2015-07-29 NOTE — Telephone Encounter (Signed)
Pt. Called requesting her MRI results. Please f/u with pt.

## 2015-07-29 NOTE — Telephone Encounter (Signed)
Date of birth verified by pt MRI results given to pt Normal ovaries, small rt inguinal hernia  Pt verbalized understanding  Pt will schedule a F/U appointment in 3 weeks

## 2015-07-29 NOTE — Telephone Encounter (Signed)
-----   Message from Boykin Nearing, MD sent at 07/24/2015  8:59 AM EDT ----- Pelvic MRI with normal ovaries Small R inguinal hernia Will check hernia with patient at f/u. It is small and contains fat only.

## 2015-07-30 NOTE — Telephone Encounter (Signed)
Rhea or [employer] Can u call her [place]! Dorcas Mcmurray

## 2015-07-30 NOTE — Telephone Encounter (Signed)
Done, she is on your schedule for this Friday.

## 2015-08-01 ENCOUNTER — Encounter: Payer: Self-pay | Admitting: Family Medicine

## 2015-08-01 ENCOUNTER — Ambulatory Visit (INDEPENDENT_AMBULATORY_CARE_PROVIDER_SITE_OTHER): Payer: Self-pay | Admitting: Family Medicine

## 2015-08-01 VITALS — BP 103/80 | HR 79 | Ht 61.0 in | Wt 167.0 lb

## 2015-08-01 DIAGNOSIS — M19032 Primary osteoarthritis, left wrist: Secondary | ICD-10-CM

## 2015-08-01 MED ORDER — TRAMADOL HCL 50 MG PO TABS: 100.0000 mg | ORAL_TABLET | Freq: Three times a day (TID) | ORAL | Status: DC | PRN

## 2015-08-01 NOTE — Assessment & Plan Note (Signed)
We will start some tramadol for pain management. Ultimately I think she needs hand surgery evaluation and we will try to set that up through [facility] or [place]. Hurts her to apply for vocational rehabilitation as well as disability as a don't think she's going to be using this hand for chronic repetitive motion in the future. Greater than 50% of our 20 minute office visit was spent in counseling and education regarding these issues.

## 2015-08-01 NOTE — Progress Notes (Signed)
   Subjective:    Patient ID: April Hudson, female    DOB: [birth year], 44 y.o.   MRN: [id number]  HPI Follow-up left wrist pain. We are going to review her MRI images today and discuss options.   Review of Systems No improvement in pain. Continues to have some stiffness in the left wrist. No fever, sweats, chills.    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       Assessment & Plan:

## 2015-08-05 NOTE — Addendum Note (Signed)
Addended by: Cyd Silence on: 08/05/2015 09:51 AM   Modules accepted: Orders

## 2015-08-06 ENCOUNTER — Telehealth: Payer: Self-pay | Admitting: *Deleted

## 2015-08-06 NOTE — Telephone Encounter (Signed)
error 

## 2015-08-18 ENCOUNTER — Ambulatory Visit: Payer: Self-pay | Attending: Family Medicine | Admitting: Occupational Therapy

## 2015-08-18 ENCOUNTER — Telehealth: Payer: Self-pay | Admitting: *Deleted

## 2015-08-18 ENCOUNTER — Encounter: Payer: Self-pay | Admitting: Occupational Therapy

## 2015-08-18 DIAGNOSIS — M79645 Pain in left finger(s): Secondary | ICD-10-CM | POA: Insufficient documentation

## 2015-08-18 DIAGNOSIS — M25532 Pain in left wrist: Secondary | ICD-10-CM | POA: Insufficient documentation

## 2015-08-18 DIAGNOSIS — M6289 Other specified disorders of muscle: Secondary | ICD-10-CM | POA: Insufficient documentation

## 2015-08-18 DIAGNOSIS — R29898 Other symptoms and signs involving the musculoskeletal system: Secondary | ICD-10-CM

## 2015-08-18 DIAGNOSIS — M25542 Pain in joints of left hand: Secondary | ICD-10-CM

## 2015-08-18 NOTE — Therapy (Signed)
[facility] [address] [place], [place], [address] Phone: [phone]   Fax:  [phone]  Occupational Therapy Evaluation  Patient Details  Name: April Hudson MRN: [id number] Date of Birth: 12-12-1970 Referring Provider: Dr. Dorcas Mcmurray  Encounter Date: 08/18/2015      OT End of Session - 08/18/15 1039    Visit Number 1   Number of Visits 17   Date for OT Re-Evaluation 10/16/15   Authorization Type SELF PAY   OT Start Time 0800   OT Stop Time 0845   OT Time Calculation (min) 45 min   Activity Tolerance Patient tolerated treatment well      Past Medical History  Diagnosis Date  . History of frequent urinary tract infections   . Smoker   . Joint pain   . History of gastric ulcer     clinical diagnosis, no prior EGD  . Migraines Dx 2013  . Anemia     since at least 2013  . Allergy   . GERD (gastroesophageal reflux disease) Dx 2015  . Asthma     as a child    Past Surgical History  Procedure Laterality Date  . Knee surgery    . Vaginal hysterectomy  2006    for fibroids and heavy menstrual bleeding     There were no vitals filed for this visit.  Visit Diagnosis:  Pain in wrist joint, left - Plan: Ot plan of care cert/re-cert  Pain in thumb joint with movement of left hand - Plan: Ot plan of care cert/re-cert  Weakness of left hand - Plan: Ot plan of care cert/re-cert      Subjective Assessment - 08/18/15 0804    Subjective  The pain started in July of this year   Repetition Increases Symptoms   Special Tests MRI reveals severe OA of the scaphotrapezoid joint and cystic changes of the lunate   Patient Stated Goals To get my hand back where I can use it   Currently in Pain? Yes   Pain Score 6    Pain Location Wrist   Pain Orientation Left   Pain Descriptors / Indicators Aching;Sharp   Pain Type Chronic pain   Pain Onset More than a month ago   Pain Frequency Constant   Aggravating  Factors  moving it and trying to lift   Pain Relieving Factors pain meds           [facility] OT Assessment - 08/18/15 0001    Assessment   Diagnosis osteoarthritis of Lt wrist   Referring Provider Dr. Dorcas Mcmurray   Onset Date --  July 2016   Assessment Pt arrives with pre-fab thumb spica splint   Prior Therapy none per pt report   Precautions   Precautions None   Balance Screen   Has the patient fallen in the past 6 months No   Has the patient had a decrease in activity level because of a fear of falling?  No   Is the patient reluctant to leave their home because of a fear of falling?  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 Mod I for all other BADLS. Mod assist with cooking and cleaning.    Mobility   Mobility Status Independent   Written Expression   Dominant Hand Right   Handwriting 100% legible   Sensation   Light Touch Appears Intact   Coordination   9 Hole Peg Test Right;Left   Right 9 Hole Peg Test 20.03 sec.    Left 9 Hole Peg Test 31.75 sec.    Edema   Edema none   ROM / Strength   AROM / PROM / Strength AROM   AROM   Overall AROM Comments wrist ROM WFL's (flex/ext = 60*). Digits 2-5 WFL's   Left Hand AROM   L Thumb MCP 0-60 60 Degrees   L Thumb Radial ADduction/ABduction 0-55 85   L Thumb Palmar ADduction/ABduction 0-45 75   Hand Function   Right Hand Grip (lbs) 65 lbs   Right Hand Lateral Pinch 18.5 lbs   Right Hand 3 Point Pinch 11 lbs   Left Hand Grip (lbs) 21 lbs   Left Hand Lateral Pinch 15 lbs   Left 3 point pinch 11 lbs                           OT Short Term Goals - 08/18/15 1051    OT  SHORT TERM GOAL #1   Title Independent with initial HEP (ALL STG's due 09/16/15)   Time 4   Period Weeks   Status New   OT SHORT TERM GOAL #2   Title Pt to verbalize understanding with joint protection techniques, A/E recommendations, and task modifications to reduce pain and increase independence with ADLS   Time 4   Period Weeks   Status New   OT SHORT TERM GOAL #3   Title Pt to consistently hook bra, buttons, and tie shoes at mod I level using A/E prn   Time 4   Period Weeks   Status New           OT Long Term Goals - 08/18/15 1053    OT LONG TERM GOAL #1   Title Independent with updated HEP PRN (All LTG's due 10/16/15)   Time 8   Period Weeks   Status New   OT LONG TERM GOAL #2   Title Independent with updated splint wear and care prn    Time 8   Period Weeks   Status New   OT LONG TERM GOAL #3   Title Grip strength Lt hand to be 15 lbs or greater from initial eval    Baseline 21 lbs (Rt = 65 lbs)   Time 8   Period Weeks   Status New   OT LONG TERM GOAL #4   Title Pain overall to be 5/10 or under Lt hand for daily activities   Time 8   Period Weeks   Status New   OT LONG TERM GOAL #5   Title Explore and refer to Voc Rehab as appropriate per MD request   Time 8   Period Weeks   Status New               Plan - 08/18/15 1041    Clinical Impression Statement Pt is a 44 y.o. female who presents to outpatient rehab with osteoarthritis of Lt wrist (see MRI results in imaging). Pt with decreased strength in Lt hand and increased pain in wrist and thumb.    Pt  will benefit from skilled therapeutic intervention in order to improve on the following deficits (Retired) Decreased range of motion;Decreased activity tolerance;Decreased knowledge of use of DME;Impaired UE functional use;Pain;Decreased strength;Impaired flexibility   Rehab Potential Fair   Clinical Impairments Affecting Rehab Potential Pt reports requiring surgery to help stabalize joint, therefore  therapy will be limited in what we can achieve   OT Frequency 2x / week   OT Duration 8 weeks  Plus evaluation. Will modify prn per pt's needs and financial concerns   OT Treatment/Interventions Self-care/ADL training;Therapeutic exercise;Patient/family education;Manual Therapy;Splinting;Iontophoresis;Cryotherapy;Parrafin;DME and/or AE instruction;Compression bandaging;Therapeutic activities;Fluidtherapy;Electrical Stimulation;Moist Heat;Contrast Bath;Passive range of motion   Plan Begin fluidotherapy, joint protection techniques and task modifications. Also, follow up on Voc Rehab referral (per MD request) but pt to follow up with MD 1st    Consulted and Agree with Plan of Care Patient;Family member/caregiver   Family Member Consulted spouse        Problem List Patient Active Problem List   Diagnosis Date Noted  . Pelvic pain in female 07/10/2015  . 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 Tinea pedis 05/26/2015  . Right axillary swelling 05/26/2015  . Pain of left thumb 05/26/2015  . Chest pain 05/26/2015  . 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 Bilateral lower abdominal pain 04/14/2015  . De Quervain's tenosynovitis, left 04/14/2015  . Healthcare maintenance 04/14/2015  . Neck pain on right side 01/13/2015  . Carpal tunnel syndrome 12/30/2014  . Smoker 07/03/2014  . Family history of stomach cancer 07/03/2014    Carey Bullocks, OTR/L 08/18/2015, 10:58 AM  [facility] [address] [place], [place], [address] Phone: [phone]   Fax:  [phone]  Name: April Hudson MRN: [id number] Date of Birth: 1971-08-14

## 2015-08-18 NOTE — Telephone Encounter (Signed)
Called about vocational rehab on her wrist, hasn't heard back. Gave her the rehab numbers to follow up

## 2015-08-20 ENCOUNTER — Telehealth: Payer: Self-pay | Admitting: Occupational Therapy

## 2015-08-20 ENCOUNTER — Encounter: Payer: Self-pay | Admitting: Occupational Therapy

## 2015-08-20 ENCOUNTER — Ambulatory Visit: Payer: Self-pay | Admitting: Occupational Therapy

## 2015-08-20 DIAGNOSIS — M25542 Pain in joints of left hand: Secondary | ICD-10-CM

## 2015-08-20 DIAGNOSIS — M25532 Pain in left wrist: Secondary | ICD-10-CM

## 2015-08-20 NOTE — Telephone Encounter (Signed)
Dr. Nori Riis  I am seeing April Hudson for O.T. and had a couple questions re: her referral. You wanted [place] to refer to [facility] but wouldn't that conflict with filing for long term disability that the patient is trying to get? Also, the patient reports you are scheduling surgery for her, so wanted to know what specifically you wanted O.T. to address prior to surgery? Typically, that would not have therapy until after surgery.

## 2015-08-20 NOTE — Therapy (Signed)
[facility] [address] [place], [place], [address] Phone: [phone]   Fax:  [phone]  Occupational Therapy Treatment  Patient Details  Name: April Hudson MRN: [id number] Date of Birth: 01/24/1971 Referring Provider: Dr. Dorcas Mcmurray  Encounter Date: 08/20/2015      OT End of Session - 08/20/15 1038    Visit Number 2   Number of Visits 17   Date for OT Re-Evaluation 10/16/15   Authorization Type SELF PAY   OT Start Time 0945   OT Stop Time 1030   OT Time Calculation (min) 45 min   Equipment Utilized During Treatment fluidotherapy   Activity Tolerance Patient tolerated treatment well      Past Medical History  Diagnosis Date  . History of frequent urinary tract infections   . Smoker   . Joint pain   . History of gastric ulcer     clinical diagnosis, no prior EGD  . Migraines Dx 2013  . Anemia     since at least 2013  . Allergy   . GERD (gastroesophageal reflux disease) Dx 2015  . Asthma     as a child    Past Surgical History  Procedure Laterality Date  . Knee surgery    . Vaginal hysterectomy  2006    for fibroids and heavy menstrual bleeding     There were no vitals filed for this visit.  Visit Diagnosis:  Pain in wrist joint, left  Pain in thumb joint with movement of left hand      Subjective Assessment - 08/20/15 0952    Subjective  My pain is worse today. The Dr. told me to go ahead with the voc rehab   Repetition Increases Symptoms   Special Tests MRI reveals severe OA of the scaphotrapezoid joint and cystic changes of the lunate   Patient Stated Goals To get my hand back where I can use it   Currently in Pain? Yes   Pain Score 9    Pain Location Wrist   Pain Orientation Left   Pain Descriptors / Indicators Aching;Sharp   Pain Type Chronic pain   Pain Onset More than a month ago   Pain Frequency Constant   Aggravating Factors  moving it and trying to lift   Pain  Relieving Factors pain meds, heat                      OT Treatments/Exercises (OP) - 08/20/15 0001    ADLs   ADL Comments Voc Rehab referral completed and sent today. Sent inbasket message to referring MD to clarify Voc Rehab request when filing for disability as well as to get clarification on when surgery scheduled and what O.T. should focus on prior to surgery. Pt issued arthritis booklet and reviewed/discussed with emphasis on joint protection techniques, A/E recommendations, and task modifications   Modalities   Modalities Fluidotherapy   LUE Fluidotherapy   Number Minutes Fluidotherapy 12 Minutes   LUE Fluidotherapy Location Hand;Wrist   Comments at beginning of session to decrease pain                OT Education - 08/20/15 1038    Education provided Yes   Education Details arthritis booklet: joint protection, A/E recommendations, task modifications   Person(s) Educated Patient   Methods Explanation;Handout   Comprehension Verbalized understanding          OT Short Term Goals - 08/18/15 1051    OT  SHORT TERM GOAL #1   Title Independent with initial HEP (ALL STG's due 09/16/15)   Time 4   Period Weeks   Status New   OT SHORT TERM GOAL #2   Title Pt to verbalize understanding with joint protection techniques, A/E recommendations, and task modifications to reduce pain and increase independence with ADLS   Time 4   Period Weeks   Status New   OT SHORT TERM GOAL #3   Title Pt to consistently hook bra, buttons, and tie shoes at mod I level using A/E prn   Time 4   Period Weeks   Status New           OT Long Term Goals - 08/18/15 1053    OT LONG TERM GOAL #1   Title Independent with updated HEP PRN (All LTG's due 10/16/15)   Time 8   Period Weeks   Status New   OT LONG TERM GOAL #2   Title Independent with updated splint wear and care prn    Time 8   Period Weeks   Status New   OT LONG TERM GOAL #3   Title Grip strength Lt hand to be  15 lbs or greater from initial eval    Baseline 21 lbs (Rt = 65 lbs)   Time 8   Period Weeks   Status New   OT LONG TERM GOAL #4   Title Pain overall to be 5/10 or under Lt hand for daily activities   Time 8   Period Weeks   Status New   OT LONG TERM GOAL #5   Title Explore and refer to Voc Rehab as appropriate per MD request   Time 8   Period Weeks   Status New               Plan - 08/20/15 1039    Clinical Impression Statement Pt approximating STG #2. Voc Rehab referral sent today. Responds well to fluidotherapy   Plan continue fluidotherapy, review recommendations from this session prn, issue initial HEP, practice hooking buttons and tying shoes   Consulted and Agree with Plan of Care Patient        Problem List Patient Active Problem List   Diagnosis Date Noted  . Pelvic pain in female 07/10/2015  . 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 Tinea pedis 05/26/2015  . Right axillary swelling 05/26/2015  . Pain of left thumb 05/26/2015  . Chest pain 05/26/2015  . 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 Bilateral lower abdominal pain 04/14/2015  . De Quervain's tenosynovitis, left 04/14/2015  . Healthcare maintenance 04/14/2015  . Neck pain on right side 01/13/2015  . Carpal tunnel syndrome 12/30/2014  . Smoker 07/03/2014  . Family history of stomach cancer 07/03/2014    Carey Bullocks, OTR/L 08/20/2015, 10:42 AM  [facility] [address] [place], [place], [address] Phone: [phone]   Fax:  [phone]  Name: April Hudson MRN: [id number] Date of Birth: June 13, 1971

## 2015-08-22 ENCOUNTER — Encounter: Payer: Self-pay | Admitting: Family Medicine

## 2015-08-22 NOTE — Telephone Encounter (Signed)
Ok, confusing I know! She has severe os arthritis of her wrist and would likely need some type of hand surgery such as a fusion or joint replacement or carpectomy. Unfortunately she does not have any insurance so I can't get her in to see a surgeon anytime the near future. I sent her to occupational therapy for 2 reasons.  #1, I wanted to get some data on exactly what she could and could not do with her hand. I was able to get this from your last note so thank you.  #2. Additionally I wanted  to see if there was anything we could do from an occupational therapy standpoint that would improve her function at all. I doubt we can improve it much.  Sending her to vocational rehabilitation was something that I did not know you at O.T. could set up. I had actually mentioned that to her as a long-term plan because she's not going to be able to go back to her previous job of working in the [employer].  Regarding any hand surgery, yes she will need OT after that but is unclear when, or if, she will be able to have access to that type of medical care.  Thanks for your questions.

## 2015-08-25 ENCOUNTER — Ambulatory Visit: Payer: Self-pay | Admitting: Occupational Therapy

## 2015-08-25 ENCOUNTER — Encounter: Payer: Self-pay | Admitting: Occupational Therapy

## 2015-08-25 DIAGNOSIS — M25532 Pain in left wrist: Secondary | ICD-10-CM

## 2015-08-25 DIAGNOSIS — M25542 Pain in joints of left hand: Secondary | ICD-10-CM

## 2015-08-25 NOTE — Therapy (Signed)
[facility] [address] [place], [place], [address] Phone: [phone]   Fax:  [phone]  Occupational Therapy Treatment  Patient Details  Name: April Hudson MRN: [id number] Date of Birth: 07/09/1971 Referring Provider: Dr. Dorcas Mcmurray  Encounter Date: 08/25/2015      OT End of Session - 08/25/15 0857    Visit Number 3   Number of Visits 17   Date for OT Re-Evaluation 10/16/15   Authorization Type SELF PAY   OT Start Time 0800   OT Stop Time 0855   OT Time Calculation (min) 55 min   Equipment Utilized During Treatment fluidotherapy   Activity Tolerance Patient tolerated treatment well      Past Medical History  Diagnosis Date  . History of frequent urinary tract infections   . Smoker   . Joint pain   . History of gastric ulcer     clinical diagnosis, no prior EGD  . Migraines Dx 2013  . Anemia     since at least 2013  . Allergy   . GERD (gastroesophageal reflux disease) Dx 2015  . Asthma     as a child    Past Surgical History  Procedure Laterality Date  . Knee surgery    . Vaginal hysterectomy  2006    for fibroids and heavy menstrual bleeding     There were no vitals filed for this visit.  Visit Diagnosis:  Pain in wrist joint, left  Pain in thumb joint with movement of left hand      Subjective Assessment - 08/25/15 0804    Subjective  The recommendations have been helping, but last night my pain got worse trying to get out of the tub.    Repetition Increases Symptoms   Special Tests MRI reveals severe OA of the scaphotrapezoid joint and cystic changes of the lunate   Patient Stated Goals To get my hand back where I can use it   Currently in Pain? Yes   Pain Score 6    Pain Location Wrist   Pain Orientation Left   Pain Descriptors / Indicators Sharp   Pain Type Chronic pain   Pain Onset More than a month ago   Pain Frequency Constant   Aggravating Factors  moving it and trying  to lift   Pain Relieving Factors pain meds, heat                      OT Treatments/Exercises (OP) - 08/25/15 0001    ADLs   UB Dressing Pt shown button hook and demo use. Pt return demo and reports it is much easier and less painful than buttoning regularly   LB Dressing Pt shown different A/E options for tying shoes including elastic shoe laces, spyrolaces, and shoe buttons. Pt shown how to use shoe buttons and return demo   ADL Comments Pt provided hanouts on A/E mentioned above and told how/where to purchase   Exercises   Exercises Wrist;Hand   Wrist Exercises   Other wrist exercises wrist flexion and extension (keeping thumb relaxed) and avoiding wrist deviation x 15 reps each way   Hand Exercises   Other Hand Exercises thumb flexion and radial abduction, keeping wrist neutral x 15 reps each way. Pt instructed to go through ROM ONLY within pain tolerance   LUE Fluidotherapy   Number Minutes Fluidotherapy 12 Minutes   LUE Fluidotherapy Location Hand;Wrist   Comments at beginnin of session to decrease pain  OT Education - 08/25/15 [phone]    Education provided Yes   Education Details A/ROM HEP (wrist and thumb), A/E recommendations   Person(s) Educated Patient   Methods Explanation;Demonstration;Handout   Comprehension Verbalized understanding;Returned demonstration          OT Short Term Goals - 08/18/15 1051    OT SHORT TERM GOAL #1   Title Independent with initial HEP (ALL STG's due 09/16/15)   Time 4   Period Weeks   Status New   OT SHORT TERM GOAL #2   Title Pt to verbalize understanding with joint protection techniques, A/E recommendations, and task modifications to reduce pain and increase independence with ADLS   Time 4   Period Weeks   Status New   OT SHORT TERM GOAL #3   Title Pt to consistently hook bra, buttons, and tie shoes at mod I level using A/E prn   Time 4   Period Weeks   Status New           OT Long Term  Goals - 08/18/15 1053    OT LONG TERM GOAL #1   Title Independent with updated HEP PRN (All LTG's due 10/16/15)   Time 8   Period Weeks   Status New   OT LONG TERM GOAL #2   Title Independent with updated splint wear and care prn    Time 8   Period Weeks   Status New   OT LONG TERM GOAL #3   Title Grip strength Lt hand to be 15 lbs or greater from initial eval    Baseline 21 lbs (Rt = 65 lbs)   Time 8   Period Weeks   Status New   OT LONG TERM GOAL #4   Title Pain overall to be 5/10 or under Lt hand for daily activities   Time 8   Period Weeks   Status New   OT LONG TERM GOAL #5   Title Explore and refer to Voc Rehab as appropriate per MD request   Time 8   Period Weeks   Status New               Plan - 08/25/15 0857    Clinical Impression Statement Pt approximating all STG's. Pt limited by pain    Plan continue fluidotherapy, A/ROM ex's as tolerated, ? light strengthening if pain allows.    Consulted and Agree with Plan of Care Patient        Problem List Patient Active Problem List   Diagnosis Date Noted  . Pelvic pain in female 07/10/2015  . 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 Tinea pedis 05/26/2015  . Right axillary swelling 05/26/2015  . Pain of left thumb 05/26/2015  . Chest pain 05/26/2015  . 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 Bilateral lower abdominal pain 04/14/2015  . De Quervain's tenosynovitis, left 04/14/2015  . Healthcare maintenance 04/14/2015  . Neck pain on right side 01/13/2015  . Carpal tunnel syndrome 12/30/2014  . Smoker 07/03/2014  . Family history of stomach cancer 07/03/2014    Carey Bullocks, OTR/L 08/25/2015, 9:10 AM  [facility] [address] [place], [place], [address] Phone: [phone]   Fax:  [phone]  Name: Merril Holk MRN: [id number] Date of Birth: Jun 14, 1971

## 2015-08-25 NOTE — Patient Instructions (Signed)
AROM: Wrist Extension   .  With _LT___ palm down, bend wrist up, then down slowly. Only go through pain tolerance (stop if increased pain) Repeat __15__ times per set.  Do __3__ sessions per day. Keep thumb relaxed   Opposition (Active)   Touch tip of thumb to nail tip of each finger in turn, making an "O" shape. Repeat __10__ times. Do _3__ sessions per day.   MP Flexion (Active)   Bend thumb SLOWLY to touch base of little finger, keeping tip joint straight. Repeat __10-15__ times. Do _3__ sessions per day. Keep wrist neutral. STOP with increased pain!        Composite Extension (Active)   Bring thumb up and out in hitchhiker position slowly. Keep wrist neutral! Stop if increase in pain Repeat __10-15__ times. Do _3___ sessions per day.

## 2015-09-02 ENCOUNTER — Ambulatory Visit: Payer: Self-pay | Attending: Family Medicine | Admitting: Family Medicine

## 2015-09-02 ENCOUNTER — Ambulatory Visit: Payer: Self-pay | Admitting: Occupational Therapy

## 2015-09-02 ENCOUNTER — Encounter: Payer: Self-pay | Admitting: Family Medicine

## 2015-09-02 VITALS — BP 106/75 | HR 94 | Temp 98.5°F | Resp 16 | Ht 61.0 in | Wt 168.0 lb

## 2015-09-02 DIAGNOSIS — Z9889 Other specified postprocedural states: Secondary | ICD-10-CM | POA: Insufficient documentation

## 2015-09-02 DIAGNOSIS — F17200 Nicotine dependence, unspecified, uncomplicated: Secondary | ICD-10-CM | POA: Insufficient documentation

## 2015-09-02 DIAGNOSIS — M19032 Primary osteoarthritis, left wrist: Secondary | ICD-10-CM | POA: Insufficient documentation

## 2015-09-02 DIAGNOSIS — Z90710 Acquired absence of both cervix and uterus: Secondary | ICD-10-CM | POA: Insufficient documentation

## 2015-09-02 DIAGNOSIS — M549 Dorsalgia, unspecified: Secondary | ICD-10-CM | POA: Insufficient documentation

## 2015-09-02 DIAGNOSIS — Z79899 Other long term (current) drug therapy: Secondary | ICD-10-CM | POA: Insufficient documentation

## 2015-09-02 DIAGNOSIS — R1032 Left lower quadrant pain: Secondary | ICD-10-CM | POA: Insufficient documentation

## 2015-09-02 DIAGNOSIS — L720 Epidermal cyst: Secondary | ICD-10-CM | POA: Insufficient documentation

## 2015-09-02 DIAGNOSIS — G8929 Other chronic pain: Secondary | ICD-10-CM | POA: Insufficient documentation

## 2015-09-02 LAB — CBC
HEMATOCRIT: 43.6 % (ref 36.0–46.0)
Hemoglobin: 14.8 g/dL (ref 12.0–15.0)
MCH: 31.6 pg (ref 26.0–34.0)
MCHC: 33.9 g/dL (ref 30.0–36.0)
MCV: 93.2 fL (ref 78.0–100.0)
MPV: 10 fL (ref 8.6–12.4)
Platelets: 222 10*3/uL (ref 150–400)
RBC: 4.68 MIL/uL (ref 3.87–5.11)
RDW: 13.9 % (ref 11.5–15.5)
WBC: 7.1 10*3/uL (ref 4.0–10.5)

## 2015-09-02 LAB — COMPLETE METABOLIC PANEL WITH GFR
ALBUMIN: 4.6 g/dL (ref 3.6–5.1)
ALT: 9 U/L (ref 6–29)
AST: 16 U/L (ref 10–35)
Alkaline Phosphatase: 66 U/L (ref 33–115)
BUN: 12 mg/dL (ref 7–25)
CALCIUM: 9.7 mg/dL (ref 8.6–10.2)
CHLORIDE: 104 mmol/L (ref 98–110)
CO2: 27 mmol/L (ref 20–31)
CREATININE: 0.77 mg/dL (ref 0.50–1.10)
GFR, Est Non African American: >89 mL/min (ref >=60)
Glucose, Bld: 88 mg/dL (ref 65–99)
Potassium: 4.8 mmol/L (ref 3.5–5.3)
Sodium: 139 mmol/L (ref 135–146)
TOTAL PROTEIN: 7.4 g/dL (ref 6.1–8.1)
Total Bilirubin: 0.3 mg/dL (ref 0.2–1.2)

## 2015-09-02 MED ORDER — ACIDOPHILUS PROBIOTIC PO TABS: 1.0000 | ORAL_TABLET | Freq: Every day | ORAL | Status: DC

## 2015-09-02 MED ORDER — ONDANSETRON 8 MG PO TBDP: 8.0000 mg | ORAL_TABLET | Freq: Three times a day (TID) | ORAL | Status: DC | PRN

## 2015-09-02 NOTE — Progress Notes (Signed)
F/U MRI results  C/C Knot on back  Pain scale #3 No suicide thought

## 2015-09-02 NOTE — Patient Instructions (Addendum)
April Hudson was seen today for back pain and osteoarthritis.  Diagnoses and all orders for this visit:  LLQ abdominal pain -     ondansetron (ZOFRAN-ODT) 8 MG disintegrating tablet; Take 1 tablet (8 mg total) by mouth every 8 (eight) hours as needed for nausea or vomiting. -     Lactobacillus (ACIDOPHILUS PROBIOTIC) TABS; Take 1 tablet by mouth daily. -     CT Abdomen Pelvis W Contrast; Future  Primary osteoarthritis of left wrist  Epidermoid cyst  Incision and drainage done today Bathe normally Pat dry Cover wound after bathing The wound will close and heal from inside out  F/u in 4 weeks for left lower abdominal pain, sooner if needed  Dr. Adrian Blackwater    Epidermal Cyst An epidermal cyst is sometimes called a sebaceous cyst, epidermal inclusion cyst, or infundibular cyst. These cysts usually contain a substance that looks "pasty" or "cheesy" and may have a bad smell. This substance is a protein called keratin. Epidermal cysts are usually found on the face, neck, or trunk. They may also occur in the vaginal area or other parts of the genitalia of both men and women. Epidermal cysts are usually small, painless, slow-growing bumps or lumps that move freely under the skin. It is important not to try to pop them. This may cause an infection and lead to tenderness and swelling. CAUSES  Epidermal cysts may be caused by a deep penetrating injury to the skin or a plugged hair follicle, often associated with acne. SYMPTOMS  Epidermal cysts can become inflamed and cause:  Redness.  Tenderness.  Increased temperature of the skin over the bumps or lumps.  Grayish-white, bad smelling material that drains from the bump or lump. DIAGNOSIS  Epidermal cysts are easily diagnosed by your caregiver during an exam. Rarely, a tissue sample (biopsy) may be taken to rule out other conditions that may resemble epidermal cysts. TREATMENT   Epidermal cysts often get better and disappear on their own. They  are rarely ever cancerous.  If a cyst becomes infected, it may become inflamed and tender. This may require opening and draining the cyst. Treatment with antibiotics may be necessary. When the infection is gone, the cyst may be removed with minor surgery.  Small, inflamed cysts can often be treated with antibiotics or by injecting steroid medicines.  Sometimes, epidermal cysts become large and bothersome. If this happens, surgical removal in your caregiver's office may be necessary. HOME CARE INSTRUCTIONS  Only take over-the-counter or prescription medicines as directed by your caregiver.  Take your antibiotics as directed. Finish them even if you start to feel better. SEEK MEDICAL CARE IF:   Your cyst becomes tender, red, or swollen.  Your condition is not improving or is getting worse.  You have any other questions or concerns. MAKE SURE YOU:  Understand these instructions.  Will watch your condition.  Will get help right away if you are not doing well or get worse.   This information is not intended to replace advice given to you by your health care provider. Make sure you discuss any questions you have with your health care provider.   Document Released: 08/21/2004 Document Revised: 12/13/2011 Document Reviewed: 03/29/2011 Elsevier Interactive Patient Education [employer].

## 2015-09-02 NOTE — Assessment & Plan Note (Signed)
A: severe L wrist OA, patient unable to work in same capacity at [employer] P: Referral to hand surgery at [facility] pain management Continue PT Patient has applied for disability She has had initial evaluation at vocational rehab

## 2015-09-02 NOTE — Assessment & Plan Note (Signed)
A; chronic pain with persistent change in stools. Normal w/u so far P: zofran for nausea Probiotics CT abdomen to r/o diverticulitis Patient will f/u with GI in 12/2015, possibly sooner depending on symptoms

## 2015-09-02 NOTE — Assessment & Plan Note (Signed)
A: epidermal cyst P: Incision and drainage done for relief of pain, no pack

## 2015-09-02 NOTE — Progress Notes (Signed)
Patient ID: April Hudson, female   DOB: 1970-10-22, 44 y.o.   MRN: [id number]   Subjective:  Patient ID: April Hudson, female    DOB: 04/22/1971  Age: 44 y.o. MRN: [id number]  CC: Back Pain and Osteoarthritis   [name] presents for   1. L wrist osteoarthritis: patient has had MRI of R wrist done. MRI reviewed with sport's medicine doctor, Dr. Nori Riis.  2. Knot in back: tender knot, draining yellow fluid. She has hot flashes. No fever or chills. No skin color changes per her partner.   3. LLQ pain: Cramping pain worse with bowel movements. Associated with loose stools. X 4 months. No blood in stool. No weights loose. There is intermittent nausea. No emesis. She has   She has a colonoscopy on 06/17/2015. She has biopsies done. She will need f/u flex sigmoidoscopy in 6 months. She has normal abdominal plain film and CBC with diff on 06/24/15. She is s/p hysterectomy done at [facility] in [place], [place].   Diagnosis 1. Colon, polyp(s), random sites - UNREMARKABLE COLONIC MUCOSA. - NO MICROSCOPIC COLITIS, ACTIVE INFLAMMATION OR GRANULOMAS. 2. Colon, polyp(s), descending, polyp - TUBULAR ADENOMA. NO HIGH GRADE DYSPLASIA OR MALIGNANCY IDENTIFIED. 3. 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  Pelvic MRI: 07/23/2015  IMPRESSION: Tiny fat containing right inguinal hernia. Postsurgical changes in the lower anterior abdominal wall.  Status post hysterectomy. Bilateral ovaries are within normal limits.  Trace pelvic ascites, likely physiologic.  Social History  Substance Use Topics  . Smoking status: Current Every Day Smoker -- 1.00 packs/day for 30 years    Types: Cigarettes  . Smokeless tobacco: Never Used     Comment: info given 06-16-15  . Alcohol Use: No    Outpatient Prescriptions Prior to Visit  Medication Sig Dispense Refill  . acetaminophen-codeine (TYLENOL #3) 300-30 MG tablet Take 1 tablet  by mouth every 8 (eight) hours as needed. 60 tablet 0  . meloxicam (MOBIC) 15 MG tablet Take 1 tablet (15 mg total) by mouth daily. 30 tablet 1  . traMADol (ULTRAM) 50 MG tablet Take 2 tablets (100 mg total) by mouth every 8 (eight) hours as needed. 50 tablet 2  . nystatin-triamcinolone ointment (MYCOLOG) Apply 1 application topically 2 (two) times daily. (Patient not taking: Reported on 09/02/2015) 30 g 0  . ondansetron (ZOFRAN ODT) 4 MG disintegrating tablet Take 1 tablet (4 mg total) by mouth every 8 (eight) hours as needed for nausea or vomiting. (Patient not taking: Reported on 09/02/2015) 20 tablet 0   No facility-administered medications prior to visit.    ROS Review of Systems  Constitutional: Negative for fever and chills.  Eyes: Negative for visual disturbance.  Respiratory: Negative for shortness of breath.   Cardiovascular: Negative for chest pain.  Gastrointestinal: Positive for nausea, abdominal pain and diarrhea. Negative for vomiting, constipation and blood in stool.  Musculoskeletal: Positive for arthralgias. Negative for back pain.  Skin: Negative for rash.  Allergic/Immunologic: Negative for immunocompromised state.  Hematological: Negative for adenopathy. Does not bruise/bleed easily.  Psychiatric/Behavioral: Negative for suicidal ideas and dysphoric mood.   Objective:  BP 106/75 mmHg  Pulse 94  Temp(Src) 98.5 F (36.9 C) (Oral)  Resp 16  Ht 5\' 1"  (1.549 m)  Wt 168 lb (76.204 kg)  BMI 31.76 kg/m2  SpO2 98%  BP/Weight 09/02/2015 08/01/2015 [id number]  Systolic BP A999333 [id number]  Diastolic BP 75 80 78  Wt. (Lbs) 168 167 167  BMI 31.76 31.57 31.57   Physical Exam  Constitutional: She is oriented to person, place, and time. She appears well-developed and well-nourished. No distress.  HENT:  Head: Normocephalic and atraumatic.  Cardiovascular: Normal rate, regular rhythm, normal heart sounds and intact distal pulses.   Pulmonary/Chest: Effort normal and breath  sounds normal.  Abdominal: Soft. Bowel sounds are normal. She exhibits no distension and no mass. There is tenderness in the left lower quadrant. There is no rebound and no guarding.  Musculoskeletal: She exhibits no edema.  Neurological: She is alert and oriented to person, place, and time.  Skin: Skin is warm and dry. No rash noted.     Psychiatric: She has a normal mood and affect.   Incision and Drainage Procedure Note  Pre-operative Diagnosis: epidermal cyst   Post-operative Diagnosis: same  Indications: relief of pain   Anesthesia: 1% lidocaine with epinephrine  Procedure Details  The procedure, risks and complications have been discussed in detail (including, but not limited to airway compromise, infection, bleeding) with the patient, and the patient has signed consent to the procedure.  The skin was sterilely prepped and draped over the affected area in the usual fashion. After adequate local anesthesia, I&D with a #11 blade was performed on the midline upper back. Purulent drainage: present The patient was observed until stable.  Findings: Epidermal cyst   EBL: 3 cc's  Drains: none  Condition: Tolerated procedure well   Complications: none.    Assessment & Plan:   Problem List Items Addressed This Visit    Epidermoid cyst    A: epidermal cyst P: Incision and drainage done for relief of pain, no pack       LLQ abdominal pain - Primary    A; chronic pain with persistent change in stools. Normal w/u so far P: zofran for nausea Probiotics CT abdomen to r/o diverticulitis Patient will f/u with GI in 12/2015, possibly sooner depending on symptoms       Relevant Medications   ondansetron (ZOFRAN-ODT) 8 MG disintegrating tablet   Lactobacillus (ACIDOPHILUS PROBIOTIC) TABS   Other Relevant Orders   CT Abdomen Pelvis W Contrast   COMPLETE METABOLIC PANEL WITH GFR   CBC   Osteoarthritis of left wrist (Chronic)    A: severe L wrist OA, patient unable to  work in same capacity at [employer] P: Referral to hand surgery at [facility] Continue pain management Continue PT Patient has applied for disability She has had initial evaluation at vocational rehab          No orders of the defined types were placed in this encounter.    Follow-up: No Follow-up on file.   Boykin Nearing MD

## 2015-09-03 ENCOUNTER — Ambulatory Visit: Payer: Self-pay | Admitting: Occupational Therapy

## 2015-09-03 ENCOUNTER — Telehealth: Payer: Self-pay | Admitting: *Deleted

## 2015-09-03 DIAGNOSIS — M25532 Pain in left wrist: Secondary | ICD-10-CM

## 2015-09-03 DIAGNOSIS — M25542 Pain in joints of left hand: Secondary | ICD-10-CM

## 2015-09-03 NOTE — Telephone Encounter (Signed)
Date of birth verified by pt Normal lab results given  Pt verbalized understanding  

## 2015-09-03 NOTE — Therapy (Signed)
[facility] [address] [place], [place], [address] Phone: [phone]   Fax:  [phone]  Occupational Therapy Treatment  Patient Details  Name: April Hudson MRN: [id number] Date of Birth: [birth year] Referring Provider: Dr. Dorcas Mcmurray  Encounter Date: 09/03/2015      OT End of Session - 09/03/15 1342    Visit Number 4   Number of Visits 17   Date for OT Re-Evaluation 10/16/15   Authorization Type SELF PAY   OT Start Time 0845   OT Stop Time 0930   OT Time Calculation (min) 45 min   Activity Tolerance Patient tolerated treatment well      Past Medical History  Diagnosis Date  . History of frequent urinary tract infections   . Smoker   . Joint pain   . History of gastric ulcer     clinical diagnosis, no prior EGD  . Migraines Dx 2013  . Anemia     since at least 2013  . Allergy   . GERD (gastroesophageal reflux disease) Dx 2015  . Asthma     as a child    Past Surgical History  Procedure Laterality Date  . Knee surgery    . Vaginal hysterectomy  2006    for fibroids and heavy menstrual bleeding     There were no vitals filed for this visit.  Visit Diagnosis:  Pain in wrist joint, left  Pain in thumb joint with movement of left hand      Subjective Assessment - 09/03/15 0846    Subjective  I saw [facility] for orientation on Monday (09/01/15)    Pertinent History (Therapist received clarification from Dr. Dorcas Mcmurray on O.T. referral - see EPIC telephone encounter)   Repetition Increases Symptoms   Special Tests MRI reveals severe OA of the scaphotrapezoid joint and cystic changes of the lunate   Patient Stated Goals To get my hand back where I can use it   Currently in Pain? Yes   Pain Score 5    Pain Location Wrist   Pain Orientation Left   Pain Descriptors / Indicators Sharp   Pain Type Chronic pain   Pain Onset More than a month ago   Pain Frequency Constant   Aggravating Factors   moving it and trying to lift things   Pain Relieving Factors pain meds, heat                      OT Treatments/Exercises (OP) - 09/03/15 0001    ADLs   ADL Comments Began assessing STG's and progress to date - see goal section   LUE Fluidotherapy   Number Minutes Fluidotherapy 12 Minutes   LUE Fluidotherapy Location Hand;Wrist   Comments at beginning of session to decrease pain   Splinting   Splinting Fabricated and fitted thumb spica splint for increased comfort and conformity (vs. pre-fab splint) due to pt reporting metal stay is bothering her with pre-fab splint and very bulky. Issued custom thermoplast thumb spica splint and pt reports increased comfort.                 OT Education - 09/03/15 [phone]    Education provided Yes   Education Details splint wear and care    Person(s) Educated Patient   Methods Explanation;Demonstration;Handout   Comprehension Verbalized understanding;Returned demonstration          OT Short Term Goals - 09/03/15 1342    OT SHORT TERM  GOAL #1   Title Independent with initial HEP (ALL STG's due 09/16/15)   Time 4   Period Weeks   Status Achieved   OT SHORT TERM GOAL #2   Title Pt to verbalize understanding with joint protection techniques, A/E recommendations, and task modifications to reduce pain and increase independence with ADLS   Time 4   Period Weeks   Status Achieved   OT SHORT TERM GOAL #3   Title Pt to consistently hook bra, buttons, and tie shoes at mod I level using A/E prn   Time 4   Period Weeks   Status New           OT Long Term Goals - 09/03/15 1342    OT LONG TERM GOAL #1   Title Independent with updated HEP PRN (All LTG's due 10/16/15)   Time 8   Period Weeks   Status New   OT LONG TERM GOAL #2   Title Independent with updated splint wear and care prn    Time 8   Period Weeks   Status Achieved   OT LONG TERM GOAL #3   Title Grip strength Lt hand to be 15 lbs or greater from initial eval     Baseline 21 lbs (Rt = 65 lbs)   Time 8   Period Weeks   Status New   OT LONG TERM GOAL #4   Title Pain overall to be 5/10 or under Lt hand for daily activities   Time 8   Period Weeks   Status New   OT LONG TERM GOAL #5   Title Explore and refer to [facility] as appropriate per MD request   Time 8   Period Weeks   Status New               Plan - 09/03/15 1343    Clinical Impression Statement Pt met STG's #1 and #2 as well as LTG #2 today.    Plan light strengthening, assess splint   OT Home Exercise Plan 08/20/15: Arthritis booklet, task modifications, joint protection techniques; Voc rehab referral. 08/25/15: A/ROM HEP and  A/E recommendations. 09/03/15: splint wear and care educ.   Consulted and Agree with Plan of Care Patient        Problem List Patient Active Problem List   Diagnosis Date Noted  . LLQ abdominal pain 09/02/2015  . Epidermoid cyst 09/02/2015  . Pelvic pain in female 07/10/2015  . 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 Tinea pedis 05/26/2015  . Right axillary swelling 05/26/2015  . Pain of left thumb 05/26/2015  . Chest pain 05/26/2015  . 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 Bilateral lower abdominal pain 04/14/2015  . De Quervain's tenosynovitis, left 04/14/2015  . Neck pain on right side 01/13/2015  . Carpal tunnel syndrome 12/30/2014  . Smoker 07/03/2014  . Family history of stomach cancer 07/03/2014    Carey Bullocks, OTR/L 09/03/2015, 1:47 PM  [facility] [address] [place], [place], [address] Phone: [phone]   Fax:  [phone]  Name: April Hudson MRN: [id number] Date of Birth: Sep 20, 1971

## 2015-09-03 NOTE — Telephone Encounter (Signed)
-----   Message from Boykin Nearing, MD sent at 09/03/2015  8:59 AM EST ----- Normal CBC and CMP

## 2015-09-03 NOTE — Patient Instructions (Signed)
Your Splint This splint should initially be fitted by a healthcare practitioner.  The healthcare practitioner is responsible for providing wearing instructions and precautions to the patient, other healthcare practitioners and care provider involved in the patient's care.  This splint was custom made for you. Please read the following instructions to learn about wearing and caring for your splint.  Precautions Should your splint cause any of the following problems, remove the splint immediately and contact your therapist/physician.  Swelling  Severe Pain  Pressure Areas  Stiffness  Numbness  Do not wear your splint while operating machinery unless it has been fabricated for that purpose.  When To Wear Your Splint Where your splint according to your therapist/physician instructions. During the day during aggravating activities and as needed.  Care and Cleaning of Your Splint 1. Keep your splint away from open flames. 2. Your splint will lose its shape in temperatures over 135 degrees Farenheit, ( in car windows, near radiators, ovens or in hot water).  Never make any adjustments to your splint, if the splint needs adjusting remove it and make an appointment to see your therapist. 3. Your splint may be cleaned with rubbing alcohol.  Do not immerse in hot water over 135 degrees Farenheit. 4. Straps may be washed with soap and water, but do not moisten the self-adhesive portion. 5. For ink or hard to remove spots use a scouring cleanser which contains chlorine.  Rinse the splint thoroughly after using chlorine cleanser.

## 2015-09-05 ENCOUNTER — Ambulatory Visit: Payer: Self-pay | Admitting: *Deleted

## 2015-09-09 ENCOUNTER — Ambulatory Visit (HOSPITAL_COMMUNITY)
Admission: RE | Admit: 2015-09-09 | Discharge: 2015-09-09 | Disposition: A | Discharge status: Home / Self Care | Payer: Self-pay | Source: Ambulatory Visit | Attending: Family Medicine | Admitting: Family Medicine

## 2015-09-09 DIAGNOSIS — R1032 Left lower quadrant pain: Secondary | ICD-10-CM

## 2015-09-11 ENCOUNTER — Telehealth: Payer: Self-pay | Admitting: Family Medicine

## 2015-09-11 NOTE — Telephone Encounter (Signed)
Pt. Called stating that she was seen on 09/02/15 and PCP found a cyst. Pt. Stated the cyst is getting bigger and its painful. Please f/u with pt.

## 2015-09-12 NOTE — Telephone Encounter (Signed)
Pt stated cyst getting big and red Advised to use warm compress and keep area clean  If worsen go to UC  Schedule appointment with PCP on 09/16/2015

## 2015-09-16 ENCOUNTER — Ambulatory Visit (HOSPITAL_COMMUNITY)
Admission: RE | Admit: 2015-09-16 | Discharge: 2015-09-16 | Disposition: A | Discharge status: Home / Self Care | Payer: Self-pay | Source: Ambulatory Visit | Attending: Family Medicine | Admitting: Family Medicine

## 2015-09-16 ENCOUNTER — Encounter (HOSPITAL_COMMUNITY): Payer: Self-pay

## 2015-09-16 ENCOUNTER — Ambulatory Visit: Payer: Self-pay | Admitting: Family Medicine

## 2015-09-16 DIAGNOSIS — K921 Melena: Secondary | ICD-10-CM | POA: Insufficient documentation

## 2015-09-16 DIAGNOSIS — R110 Nausea: Secondary | ICD-10-CM | POA: Insufficient documentation

## 2015-09-16 DIAGNOSIS — K5730 Diverticulosis of large intestine without perforation or abscess without bleeding: Secondary | ICD-10-CM | POA: Insufficient documentation

## 2015-09-16 DIAGNOSIS — R1032 Left lower quadrant pain: Secondary | ICD-10-CM | POA: Insufficient documentation

## 2015-09-16 MED ORDER — IOHEXOL 300 MG/ML  SOLN
80.0000 mL | Freq: Once | INTRAMUSCULAR | Status: AC | PRN
  Administered 2015-09-16: 80 mL via INTRAVENOUS

## 2015-10-01 ENCOUNTER — Telehealth: Payer: Self-pay | Admitting: *Deleted

## 2015-10-01 ENCOUNTER — Ambulatory Visit: Payer: Self-pay | Admitting: Family Medicine

## 2015-10-01 NOTE — Telephone Encounter (Signed)
-----   Message from Boykin Nearing, MD sent at 09/17/2015  8:49 AM EST ----- CT abdomen normal except for a few diverticula (pouches in colon) these pouches were not inflamed.

## 2015-10-01 NOTE — Telephone Encounter (Signed)
Date of birth verified by pt  CT abdomen results given Pt verbalized understanding

## 2015-10-02 ENCOUNTER — Encounter: Payer: Self-pay | Admitting: Occupational Therapy

## 2015-10-02 NOTE — Therapy (Signed)
[facility] [address] [place], [place], [address] Phone: [phone]   Fax:  [phone]  Patient Details  Name: April Hudson MRN: [id number] Date of Birth: [birth year] Referring Provider: Dr. Dorcas Mcmurray Encounter Date: 10/02/2015   OCCUPATIONAL THERAPY DISCHARGE SUMMARY  Visits from Start of Care: 4  Current functional level related to goals / functional outcomes:     OT Short Term Goals - 09/03/15 1342    OT SHORT TERM GOAL #1   Title Independent with initial HEP (ALL STG's due 09/16/15)   Time 4   Period Weeks   Status Achieved   OT SHORT TERM GOAL #2   Title Pt to verbalize understanding with joint protection techniques, A/E recommendations, and task modifications to reduce pain and increase independence with ADLS   Time 4   Period Weeks   Status Achieved   OT SHORT TERM GOAL #3   Title Pt to consistently hook bra, buttons, and tie shoes at mod I level using A/E prn   Time 4   Period Weeks   Status unknown     Pt also met LTG #2, but no other LTG's were met secondary to pt not returning to therapy after last visit on 09/03/15.    Remaining deficits: Pain Lt hand Strength Lt hand   Education / Equipment: Arthritis booklet, task modifications, joint protection techniques, A/ROM HEP, A/E recommendations, splint wear and care education, referral to [facility]  Plan: Patient agrees to discharge.  Patient goals were partially met. Patient is being discharged due to not returning since the last visit.  ?????       Carey Bullocks, OTR/L 10/02/2015, 12:46 PM  [facility] [address] [place], [place], [address] Phone: [phone]   Fax:  [phone]

## 2015-10-23 ENCOUNTER — Ambulatory Visit: Payer: Self-pay | Attending: Family Medicine | Admitting: Family Medicine

## 2015-10-23 ENCOUNTER — Encounter: Payer: Self-pay | Admitting: Family Medicine

## 2015-10-23 VITALS — BP 99/69 | HR 100 | Temp 98.5°F | Resp 16 | Ht 61.0 in | Wt 169.0 lb

## 2015-10-23 DIAGNOSIS — M549 Dorsalgia, unspecified: Secondary | ICD-10-CM | POA: Insufficient documentation

## 2015-10-23 DIAGNOSIS — K602 Anal fissure, unspecified: Secondary | ICD-10-CM | POA: Insufficient documentation

## 2015-10-23 DIAGNOSIS — F17210 Nicotine dependence, cigarettes, uncomplicated: Secondary | ICD-10-CM | POA: Insufficient documentation

## 2015-10-23 DIAGNOSIS — R1032 Left lower quadrant pain: Secondary | ICD-10-CM | POA: Insufficient documentation

## 2015-10-23 DIAGNOSIS — L720 Epidermal cyst: Secondary | ICD-10-CM | POA: Insufficient documentation

## 2015-10-23 DIAGNOSIS — Z79899 Other long term (current) drug therapy: Secondary | ICD-10-CM | POA: Insufficient documentation

## 2015-10-23 DIAGNOSIS — Z0000 Encounter for general adult medical examination without abnormal findings: Secondary | ICD-10-CM

## 2015-10-23 DIAGNOSIS — G8929 Other chronic pain: Secondary | ICD-10-CM | POA: Insufficient documentation

## 2015-10-23 LAB — HEMOCCULT GUIAC POC 1CARD (OFFICE): FECAL OCCULT BLD: NEGATIVE

## 2015-10-23 LAB — POCT GLYCOSYLATED HEMOGLOBIN (HGB A1C): Hemoglobin A1C: 5.7

## 2015-10-23 MED ORDER — POLYETHYLENE GLYCOL 3350 17 GM/SCOOP PO POWD: 17.0000 g | Freq: Every day | ORAL | Status: DC

## 2015-10-23 MED ORDER — DOCUSATE SODIUM 100 MG PO CAPS: 100.0000 mg | ORAL_CAPSULE | Freq: Two times a day (BID) | ORAL | Status: DC

## 2015-10-23 MED ORDER — ACETAMINOPHEN-CODEINE #3 300-30 MG PO TABS
1.0000 | ORAL_TABLET | Freq: Three times a day (TID) | ORAL | Status: DC | PRN
Start: 2015-10-23 — End: 2016-01-27

## 2015-10-23 NOTE — Progress Notes (Signed)
Back pain due to cyst, abdominal pain  Black stool and runny  Pain scale #9 Tobacco user 1ppday No suicidal thought in the past two weeks

## 2015-10-23 NOTE — Progress Notes (Signed)
Subjective:  Patient ID: April Hudson, female    DOB: 1971/04/28  Age: 45 y.o. MRN: [id number]  CC: Abdominal Pain and Back Pain   [name] presents for    1. Chronic lower abdominal pain: she has chronic pain. She was evaluated with colonoscopy in 06/2015 that was negative except for rectal polyp with FRAGMENTS OF TUBULOVILLOUS ADENOMA WITH FOCAL HIGH GRADE GLANDULAR DYSPLASIA. - NO INVASIVE CARCINOMA IDENTIFIED. The follow up plan with flex sig in 6 months.  After the colonoscopy, GI ordered an abdominal pain film and CBC which were normal. She continued to have pain. Pelvic MRI in 07/2015 was negative, she is /.p hysterectomy. She has normal ovaries. She continued to have pain. CT abdomen in 09/2015 was negative.   Today she reports, lower abdominal pain in the middle. Sharp pain. Worse with movement. Associated with nausea. No emesis. Stools are now black x 3 weeks. Feels like she has to have a BM it is hard to pass stool and stool is loose. Getting cold and hot. No dysuria. She reports that 2 months ago she had a period of very painful bowel movements.   2. back pain: persistent midline. Mid back. Associated with epidermal cyst.    Social History  Substance Use Topics  . Smoking status: Current Every Day Smoker -- 1.00 packs/day for 30 years    Types: Cigarettes  . Smokeless tobacco: Never Used     Comment: info given 06-16-15  . Alcohol Use: No     Outpatient Prescriptions Prior to Visit  Medication Sig Dispense Refill  . acetaminophen-codeine (TYLENOL #3) 300-30 MG tablet Take 1 tablet by mouth every 8 (eight) hours as needed. 60 tablet 0  . Lactobacillus (ACIDOPHILUS PROBIOTIC) TABS Take 1 tablet by mouth daily. 30 tablet 2  . meloxicam (MOBIC) 15 MG tablet Take 1 tablet (15 mg total) by mouth daily. 30 tablet 1  . ondansetron (ZOFRAN-ODT) 8 MG disintegrating tablet Take 1 tablet (8 mg total) by mouth every 8 (eight) hours as needed for nausea or vomiting. 30  tablet 1  . traMADol (ULTRAM) 50 MG tablet Take 2 tablets (100 mg total) by mouth every 8 (eight) hours as needed. 50 tablet 2   No facility-administered medications prior to visit.    ROS Review of Systems  Constitutional: Positive for fever and chills.  Eyes: Negative for visual disturbance.  Respiratory: Negative for shortness of breath.   Cardiovascular: Negative for chest pain.  Gastrointestinal: Positive for nausea, abdominal pain, diarrhea, constipation and rectal pain. Negative for vomiting, blood in stool and abdominal distention.  Musculoskeletal: Negative for back pain and arthralgias.  Skin: Negative for rash.  Allergic/Immunologic: Negative for immunocompromised state.  Hematological: Negative for adenopathy. Does not bruise/bleed easily.  Psychiatric/Behavioral: Negative for suicidal ideas and dysphoric mood.    Objective:  BP 99/69 mmHg  Pulse 100  Temp(Src) 98.5 F (36.9 C) (Oral)  Resp 16  Ht 5\' 1"  (1.549 m)  Wt 169 lb (76.658 kg)  BMI 31.95 kg/m2  SpO2 100% Pulse Readings from Last 3 Encounters:  10/23/15 100  09/02/15 94  08/01/15 79   BP/Weight 10/23/2015 09/02/2015 [id number]  Systolic BP 99 A999333 [id number]  Diastolic BP 69 75 80  Wt. (Lbs) 169 168 167  BMI 31.95 31.76 31.57    Physical Exam  Constitutional: She is oriented to person, place, and time. She appears well-developed and well-nourished. No distress.  HENT:  Head: Normocephalic and atraumatic.  Cardiovascular: Normal rate, regular rhythm,  normal heart sounds and intact distal pulses.   Pulmonary/Chest: Effort normal and breath sounds normal.  Abdominal: There is tenderness in the left lower quadrant. There is no rigidity and no guarding.  Genitourinary: Rectal exam shows fissure (fissure at 12 o'clock ) and tenderness. Rectal exam shows no external hemorrhoid, no internal hemorrhoid, no mass and anal tone normal. Guaiac negative stool.  Musculoskeletal: She exhibits no edema.  Neurological: She  is alert and oriented to person, place, and time.  Skin: Skin is warm and dry. No rash noted.     Psychiatric: She has a normal mood and affect.    Lab Results  Component Value Date   HGBA1C 5.70 10/23/2015    Assessment & Plan:   Problem List Items Addressed This Visit    Anal fissure (Chronic)   Relevant Medications   acetaminophen-codeine (TYLENOL #3) 300-30 MG tablet   polyethylene glycol powder (GLYCOLAX/MIRALAX) powder   docusate sodium (COLACE) 100 MG capsule   Other Relevant Orders   DG Abd 2 Views   Epidermoid cyst (Chronic)   Relevant Orders   Ambulatory referral to Dermatology   LLQ abdominal pain (Chronic)   Relevant Orders   CBC   DG Abd 2 Views    Other Visit Diagnoses    Healthcare maintenance    -  Primary    Relevant Orders    POCT glycosylated hemoglobin (Hb A1C) (Completed)       No orders of the defined types were placed in this encounter.    Follow-up: No Follow-up on file.   Boykin Nearing MD

## 2015-10-23 NOTE — Assessment & Plan Note (Signed)
Derm referral for persistent cyst

## 2015-10-23 NOTE — Patient Instructions (Addendum)
April Hudson was seen today for abdominal pain and back pain.  Diagnoses and all orders for this visit:  Healthcare maintenance -     POCT glycosylated hemoglobin (Hb A1C)  Epidermoid cyst -     Ambulatory referral to Dermatology  Anal fissure -     acetaminophen-codeine (TYLENOL #3) 300-30 MG tablet; Take 1 tablet by mouth every 8 (eight) hours as needed. -     polyethylene glycol powder (GLYCOLAX/MIRALAX) powder; Take 17 g by mouth daily. -     docusate sodium (COLACE) 100 MG capsule; Take 1 capsule (100 mg total) by mouth 2 (two) times daily. -     DG Abd 2 Views; Future  LLQ abdominal pain -     CBC -     DG Abd 2 Views; Future   F/u in 2 weeks for fissure recheck  Dr. Tawni Carnes Fissure, Adult An anal fissure is a small tear or crack in the skin around the anus. Bleeding from a fissure usually stops on its own within a few minutes. However, bleeding will often occur again with each bowel movement until the crack heals. CAUSES This condition may be caused by:  Passing large, hard stool (feces).  Frequent diarrhea.  Constipation.  Inflammatory bowel disease (Crohn disease or ulcerative colitis).  Infections.  Anal sex. SYMPTOMS Symptoms of this condition include:  Bleeding from the rectum.  Small amounts of blood seen on your stool, on toilet paper, or in the toilet after a bowel movement.  Painful bowel movements.  Itching or irritation around the anus. DIAGNOSIS A health care provider may diagnose this condition by closely examining the anal area. An anal fissure can usually be seen with careful inspection. In some cases, a rectal exam may be performed, or a short tube (anoscope) may be used to examine the anal canal. TREATMENT Treatment for this condition may include:  Taking steps to avoid constipation. This may include making changes to your diet, such as increasing your intake of fiber or fluid.  Taking fiber supplements. These supplements can soften  your stool to help make bowel movements easier. Your health care provider may also prescribe a stool softener if your stool is often hard.  Taking sitz baths. This may help to heal the tear.  Using medicated creams or ointments. These may be prescribed to lessen discomfort. HOME CARE INSTRUCTIONS Eating and Drinking  Avoid foods that may be constipating, such as bananas and dairy products.  Drink enough fluid to keep your urine clear or pale yellow.  Maintain a diet that is high in fruits, whole grains, and vegetables. General Instructions  Keep the anal area as clean and dry as possible.  Take sitz baths as told by your health care provider. Do not use soap in the sitz baths.  Take over-the-counter and prescription medicines only as told by your health care provider.  Use creams or ointments only as told by your health care provider.  Keep all follow-up visits as told by your health care provider. This is important. SEEK MEDICAL CARE IF:  You have more bleeding.  You have a fever.  You have diarrhea that is mixed with blood.  You continue to have pain.  Your problem is getting worse rather than better.   This information is not intended to replace advice given to you by your health care provider. Make sure you discuss any questions you have with your health care provider.   Document Released: 09/20/2005 Document Revised: 06/11/2015 Document Reviewed:  12/16/2014 Elsevier Interactive Patient Education [employer].

## 2015-10-23 NOTE — Assessment & Plan Note (Signed)
Anal fissure with abdominal pain suspect constipation due to spasm and fecal hesitancy  Plan CBC Plain film Colace miralax  Close follow up

## 2015-10-24 ENCOUNTER — Ambulatory Visit (HOSPITAL_COMMUNITY)
Admission: RE | Admit: 2015-10-24 | Discharge: 2015-10-24 | Disposition: A | Discharge status: Home / Self Care | Payer: Self-pay | Source: Ambulatory Visit | Attending: Family Medicine | Admitting: Family Medicine

## 2015-10-24 DIAGNOSIS — K5900 Constipation, unspecified: Secondary | ICD-10-CM | POA: Insufficient documentation

## 2015-10-24 DIAGNOSIS — K602 Anal fissure, unspecified: Secondary | ICD-10-CM | POA: Insufficient documentation

## 2015-10-24 DIAGNOSIS — R1032 Left lower quadrant pain: Secondary | ICD-10-CM | POA: Insufficient documentation

## 2015-10-24 DIAGNOSIS — R112 Nausea with vomiting, unspecified: Secondary | ICD-10-CM | POA: Insufficient documentation

## 2015-10-29 ENCOUNTER — Telehealth: Payer: Self-pay | Admitting: Family Medicine

## 2015-10-29 DIAGNOSIS — K5900 Constipation, unspecified: Secondary | ICD-10-CM

## 2015-10-29 NOTE — Telephone Encounter (Signed)
Pt. Called requesting to speak to the nurse. Pt. Stated she was prescribed Miralax to help her with her bowl movements and a stool softer and pt. Can not use the restroom. Pt. Stated she is having back pain and her stomach hurts from not being able to use the restroom. Please f/u with pt.

## 2015-10-30 DIAGNOSIS — K5900 Constipation, unspecified: Secondary | ICD-10-CM | POA: Insufficient documentation

## 2015-10-30 MED ORDER — GLYCERIN (LAXATIVE) 2 G RE SUPP: 1.0000 | Freq: Once | RECTAL | Status: DC

## 2015-10-30 NOTE — Telephone Encounter (Signed)
Glycerin suppository ordered Milk of magnesium  recommended, this is OTC  She should try not to take tylenol #3 and just take regular tylenol for pain. The codeine is constipation.

## 2015-10-31 NOTE — Telephone Encounter (Signed)
-----   Message from Boykin Nearing, MD sent at 10/24/2015 12:32 PM EST ----- Normal abdominal x-ray

## 2015-10-31 NOTE — Telephone Encounter (Signed)
Date of birth verified by pt  Normal Xray results given  Recommended Milk of magnesium Advised to not take tylenol #3 if needed regular tylenol

## 2015-11-06 ENCOUNTER — Ambulatory Visit: Payer: Self-pay | Admitting: Family Medicine

## 2015-12-25 ENCOUNTER — Encounter: Payer: Self-pay | Admitting: Gastroenterology

## 2016-01-05 ENCOUNTER — Encounter: Payer: Self-pay | Admitting: Gastroenterology

## 2016-01-20 ENCOUNTER — Encounter (HOSPITAL_COMMUNITY): Payer: Self-pay | Admitting: Emergency Medicine

## 2016-01-20 ENCOUNTER — Emergency Department (HOSPITAL_COMMUNITY): Payer: Self-pay

## 2016-01-20 ENCOUNTER — Emergency Department (HOSPITAL_COMMUNITY)
Admission: EM | Admit: 2016-01-20 | Discharge: 2016-01-20 | Disposition: A | Discharge status: Home / Self Care | Payer: Self-pay | Attending: Emergency Medicine | Admitting: Emergency Medicine

## 2016-01-20 DIAGNOSIS — Z862 Personal history of diseases of the blood and blood-forming organs and certain disorders involving the immune mechanism: Secondary | ICD-10-CM | POA: Insufficient documentation

## 2016-01-20 DIAGNOSIS — Z79899 Other long term (current) drug therapy: Secondary | ICD-10-CM | POA: Insufficient documentation

## 2016-01-20 DIAGNOSIS — Z87440 Personal history of urinary (tract) infections: Secondary | ICD-10-CM | POA: Insufficient documentation

## 2016-01-20 DIAGNOSIS — K219 Gastro-esophageal reflux disease without esophagitis: Secondary | ICD-10-CM | POA: Insufficient documentation

## 2016-01-20 DIAGNOSIS — Z8679 Personal history of other diseases of the circulatory system: Secondary | ICD-10-CM | POA: Insufficient documentation

## 2016-01-20 DIAGNOSIS — L02811 Cutaneous abscess of head [any part, except face]: Secondary | ICD-10-CM | POA: Insufficient documentation

## 2016-01-20 DIAGNOSIS — L729 Follicular cyst of the skin and subcutaneous tissue, unspecified: Secondary | ICD-10-CM

## 2016-01-20 DIAGNOSIS — J45909 Unspecified asthma, uncomplicated: Secondary | ICD-10-CM | POA: Insufficient documentation

## 2016-01-20 DIAGNOSIS — I728 Aneurysm of other specified arteries: Secondary | ICD-10-CM

## 2016-01-20 DIAGNOSIS — F17210 Nicotine dependence, cigarettes, uncomplicated: Secondary | ICD-10-CM | POA: Insufficient documentation

## 2016-01-20 LAB — CBC
HEMATOCRIT: 42.1 % (ref 36.0–46.0)
Hemoglobin: 14 g/dL (ref 12.0–15.0)
MCH: 31.5 pg (ref 26.0–34.0)
MCHC: 33.3 g/dL (ref 30.0–36.0)
MCV: 94.6 fL (ref 78.0–100.0)
Platelets: 203 10*3/uL (ref 150–400)
RBC: 4.45 MIL/uL (ref 3.87–5.11)
RDW: 14.5 % (ref 11.5–15.5)
WBC: 7.6 10*3/uL (ref 4.0–10.5)

## 2016-01-20 LAB — SEDIMENTATION RATE: Sed Rate: 17 mm/hr (ref 0–22)

## 2016-01-20 LAB — BASIC METABOLIC PANEL
ANION GAP: 9 (ref 5–15)
BUN: 5 mg/dL — ABNORMAL LOW (ref 6–20)
CHLORIDE: 106 mmol/L (ref 101–111)
CO2: 27 mmol/L (ref 22–32)
Calcium: 9.5 mg/dL (ref 8.9–10.3)
Creatinine, Ser: 0.73 mg/dL (ref 0.44–1.00)
GFR calc Af Amer: >60 mL/min (ref >60)
GLUCOSE: 96 mg/dL (ref 65–99)
POTASSIUM: 4 mmol/L (ref 3.5–5.1)
Sodium: 142 mmol/L (ref 135–145)

## 2016-01-20 MED ORDER — IBUPROFEN 200 MG PO TABS
600.0000 mg | ORAL_TABLET | Freq: Once | ORAL | Status: AC
  Administered 2016-01-20: 600 mg via ORAL
  Filled 2016-01-20: qty 1

## 2016-01-20 MED ORDER — LIDOCAINE HCL (PF) 1 % IJ SOLN
5.0000 mL | Freq: Once | INTRAMUSCULAR | Status: AC
  Administered 2016-01-20: 5 mL
  Filled 2016-01-20: qty 5

## 2016-01-20 NOTE — ED Notes (Signed)
Pt with small abscess to right temporal area causing pain x 2 days

## 2016-01-20 NOTE — ED Notes (Signed)
Patient will be moved to acute side per Dr. Tyrone Nine - change of acuity to 3.   Patient will need MRI and bloodwork.  Patient will be moved to POD E when they open.

## 2016-01-20 NOTE — ED Provider Notes (Addendum)
CSN: [id number]     Arrival date & time 01/20/16  2392 History   First MD Initiated Contact with Patient 01/20/16 [phone]     Chief Complaint  Patient presents with  . Abscess   (Consider location/radiation/quality/duration/timing/severity/associated sxs/prior Treatment) HPI 45 y.o. female presents to the Emergency Department today complaining of left temporal abscess x 3 weeks. Noted pain 9/10 and throbbing. Has tried warm compress with no relief. Has taken ibuprofen for pain relief. No fevers. No N/V/D. No vision changed. No headaches. No CP/SOb/ABD pain. No other symptoms noted.   Past Medical History  Diagnosis Date  . History of frequent urinary tract infections   . Smoker   . Joint pain   . History of gastric ulcer     clinical diagnosis, no prior EGD  . Migraines Dx 2013  . Anemia     since at least 2013  . Allergy   . GERD (gastroesophageal reflux disease) Dx 2015  . Asthma     as a child   Past Surgical History  Procedure Laterality Date  . Knee surgery    . Vaginal hysterectomy  2006    for fibroids and heavy menstrual bleeding    Family History  Problem Relation Age of Onset  . Diabetes Mother   . Hypertension Mother   . Stomach cancer Maternal Uncle   . Heart disease Sister   . Healthy Brother   . Asthma Son   . Asthma Daughter   . Colon cancer Maternal Uncle   . Ovarian cancer Mother    Social History  Substance Use Topics  . Smoking status: Current Every Day Smoker -- 1.00 packs/day for 30 years    Types: Cigarettes  . Smokeless tobacco: Never Used     Comment: info given 06-16-15  . Alcohol Use: No   OB History    No data available     Review of Systems ROS reviewed and all are negative for acute change except as noted in the HPI.  Allergies  Sulfa antibiotics  Home Medications   Prior to Admission medications   Medication Sig Start Date End Date Taking? Authorizing Provider  acetaminophen-codeine (TYLENOL #3) 300-30 MG tablet Take 1 tablet  by mouth every 8 (eight) hours as needed. 10/23/15   Josalyn Funches, MD  docusate sodium (COLACE) 100 MG capsule Take 1 capsule (100 mg total) by mouth 2 (two) times daily. 10/23/15   Josalyn Funches, MD  glycerin adult (GLYCERIN ADULT) 2 g SUPP Place 1 suppository rectally once. 10/30/15   Josalyn Funches, MD  polyethylene glycol powder (GLYCOLAX/MIRALAX) powder Take 17 g by mouth daily. 10/23/15   Josalyn Funches, MD   BP 112/72 mmHg  Pulse 89  Temp(Src) 98.9 F (37.2 C) (Oral)  Resp 18  SpO2 96%   Physical Exam  Constitutional: She is oriented to person, place, and time. She appears well-developed and well-nourished.  HENT:  Head: Normocephalic and atraumatic.  Left temporal abscess noted. No erythema. No purulent drainage. No signs of infection   Eyes: EOM are normal.  Cardiovascular: Normal rate and regular rhythm.   Pulmonary/Chest: Effort normal.  Abdominal: Soft.  Musculoskeletal: Normal range of motion.  Neurological: She is alert and oriented to person, place, and time.  Skin: Skin is warm and dry.  Psychiatric: She has a normal mood and affect. Her behavior is normal. Thought content normal.  Nursing note and vitals reviewed.  ED Course  .Marland KitchenIncision and Drainage Date/Time: 01/28/2016 1:16 PM Performed by: Audry Pili  Authorized by: Shary Decamp Consent: Verbal consent obtained. Risks and benefits: risks, benefits and alternatives were discussed Consent given by: patient Patient understanding: patient states understanding of the procedure being performed Patient consent: the patient's understanding of the procedure matches consent given Procedure consent: procedure consent matches procedure scheduled Patient identity confirmed: verbally with patient and arm band Type: abscess Body area: head Location details: scalp Local anesthetic: lidocaine 1% without epinephrine Anesthetic total: 1 ml Scalpel size: 11 Needle gauge: 22 Incision type: single straight Incision  depth: dermal Complexity: simple Wound treatment: wound left open Patient tolerance: Patient tolerated the procedure well with no immediate complications Comments: No drainage from location    (including critical care time) Labs Review Labs Reviewed  BASIC METABOLIC PANEL - Abnormal; Notable for the following:    BUN 5 (*)    All other components within normal limits  CBC  SEDIMENTATION RATE    Imaging Review Mr Virgel Paling Wo Contrast  01/20/2016  CLINICAL DATA:  Right temporal pain. Rule out aneurysm superficial temporal artery. Rule out temporal arteritis. EXAM: MRA HEAD WITHOUT CONTRAST TECHNIQUE: Angiographic images of the Circle of Willis were obtained using MRA technique without intravenous contrast. COMPARISON:  None. FINDINGS: Both vertebral arteries are patent to the basilar. Basilar widely patent. Left PICA patent. Right AICA patent. Superior cerebellar and posterior cerebral arteries widely patent bilaterally. Cavernous carotid widely patent bilaterally. Anterior and middle cerebral arteries widely patent. No intracranial aneurysm. Superficial temporal artery is patent bilaterally. No aneurysm identified. IMPRESSION: Normal MRA of the circle Willis Superficial temporal artery is patent bilaterally. No narrowing or aneurysm identified. This is a suboptimal imaging technique for the superficial temporal artery Electronically Signed   By: Franchot Gallo M.D.   On: 01/20/2016 12:03   I have personally reviewed and evaluated these images and lab results as part of my medical decision-making.   EKG Interpretation None      MDM  I have reviewed the relevant previous healthcare records. I obtained HPI from historian.  ED Course:  Assessment: Pt is a 44yF who presents with nodule on right temple. On exam, pt in NAD. Nontoxic/nonseptic appearing. VSS. Afebrile. nodule located on right temple. TTP. Non erythematous/non infectious. Spoke with attending physician who has seen patient.  Concern for temporal arteritis vs aneurysm. Labs unremarkable. ESR negative. MRI unremarkable for aneurysm. Most likely cyst as I+D produced no purulent drainage. Plan is to DC to ENT for cyst removal. At time of discharge, Patient is in no acute distress. Vital Signs are stable. Patient is able to ambulate. Patient able to tolerate PO.     Disposition/Plan:  DC Home Additional Verbal discharge instructions given and discussed with patient.  Pt Instructed to f/u with PCP in the next week for evaluation and treatment of symptoms. Return precautions given Pt acknowledges and agrees with plan  Supervising Physician Deno Etienne, DO   Final diagnoses:  Cyst of skin       Shary Decamp, PA-C 01/20/16 [place], PA-C 01/20/16 [place], DO 01/20/16 [place], PA-C 01/28/16 [place], DO 01/28/16 [id number]

## 2016-01-20 NOTE — ED Notes (Signed)
Returned from MRI 

## 2016-01-20 NOTE — Discharge Instructions (Signed)
Please read and follow all provided instructions.  Your diagnoses today include:  1. Cyst of skin    Tests performed today include:  Vital signs. See below for your results today.   Medications prescribed:   None  Home care instructions:  Follow any educational materials contained in this packet.  Follow-up instructions: Please follow-up with ENT with number provided for further evaluation of symptoms and treatment   Return instructions:   Please return to the Emergency Department if you do not get better, if you get worse, or new symptoms OR  - Fever (temperature greater than 101.56F)  - Bleeding that does not stop with holding pressure to the area    -Severe pain (please note that you may be more sore the day after your accident)  - Chest Pain  - Difficulty breathing  - Severe nausea or vomiting  - Inability to tolerate food and liquids  - Passing out  - Skin becoming red around your wounds  - Change in mental status (confusion or lethargy)  - New numbness or weakness     Please return if you have any other emergent concerns.  Additional Information:  Your vital signs today were: BP 112/72 mmHg   Pulse 89   Temp(Src) 98.9 F (37.2 C) (Oral)   Resp 18   SpO2 96% If your blood pressure (BP) was elevated above 135/85 this visit, please have this repeated by your doctor within one month. ---------------

## 2016-01-27 ENCOUNTER — Ambulatory Visit: Payer: Self-pay | Attending: Family Medicine | Admitting: Family Medicine

## 2016-01-27 ENCOUNTER — Encounter: Payer: Self-pay | Admitting: Family Medicine

## 2016-01-27 VITALS — BP 103/70 | HR 76 | Temp 99.2°F | Resp 16 | Ht 61.5 in | Wt 168.0 lb

## 2016-01-27 DIAGNOSIS — A549 Gonococcal infection, unspecified: Secondary | ICD-10-CM

## 2016-01-27 DIAGNOSIS — A5901 Trichomonal vulvovaginitis: Secondary | ICD-10-CM

## 2016-01-27 DIAGNOSIS — R51 Headache: Secondary | ICD-10-CM | POA: Insufficient documentation

## 2016-01-27 DIAGNOSIS — L720 Epidermal cyst: Secondary | ICD-10-CM | POA: Insufficient documentation

## 2016-01-27 DIAGNOSIS — F17210 Nicotine dependence, cigarettes, uncomplicated: Secondary | ICD-10-CM | POA: Insufficient documentation

## 2016-01-27 DIAGNOSIS — Z113 Encounter for screening for infections with a predominantly sexual mode of transmission: Secondary | ICD-10-CM | POA: Insufficient documentation

## 2016-01-27 DIAGNOSIS — Z79899 Other long term (current) drug therapy: Secondary | ICD-10-CM | POA: Insufficient documentation

## 2016-01-27 DIAGNOSIS — Z114 Encounter for screening for human immunodeficiency virus [HIV]: Secondary | ICD-10-CM | POA: Insufficient documentation

## 2016-01-27 DIAGNOSIS — H538 Other visual disturbances: Secondary | ICD-10-CM | POA: Insufficient documentation

## 2016-01-27 MED ORDER — DICLOFENAC SODIUM 1 % TD GEL: 2.0000 g | Freq: Four times a day (QID) | TRANSDERMAL | Status: DC

## 2016-01-27 MED ORDER — ACETAMINOPHEN-CODEINE #3 300-30 MG PO TABS: 1.0000 | ORAL_TABLET | Freq: Three times a day (TID) | ORAL | Status: DC | PRN

## 2016-01-27 NOTE — Progress Notes (Signed)
F/U ER Cyst on face, pressure on eye and pain  Pain scale #8 Requesting HIV, and STD testing  No tobacco 1pp day  No suicidal thought in the past two weeks

## 2016-01-27 NOTE — Progress Notes (Signed)
Subjective:  Patient ID: April Hudson, female    DOB: [birth year]  Age: 45 y.o. MRN: [id number]  CC: Cyst   [name] presents for   1. L temple cyst: started as a small pimple. Was painless. Developed a white head. She tried to pop it. Nothing came out. She went to ED on 01/20/2016, had MR MRA head wo contrast  that was negative for temporal artery aneurysm.  Attempt at I&D was unsuccessful.  HA, blurry vision. No nausea or vomiting. Has tried ibuprofen and tyelnol at home without relief. Has ENT appt on 02/04/2016.    2. Request STI screen: reports her husband has admitted to sex with other women. She request STI testing. Denies vaginal discharge and genital lesions.   Social History  Substance Use Topics  . Smoking status: Current Every Day Smoker -- 1.00 packs/day for 30 years    Types: Cigarettes  . Smokeless tobacco: Never Used     Comment: info given 06-16-15  . Alcohol Use: No    Outpatient Prescriptions Prior to Visit  Medication Sig Dispense Refill  . acetaminophen-codeine (TYLENOL #3) 300-30 MG tablet Take 1 tablet by mouth every 8 (eight) hours as needed. (Patient not taking: Reported on 01/20/2016) 60 tablet 0  . docusate sodium (COLACE) 100 MG capsule Take 1 capsule (100 mg total) by mouth 2 (two) times daily. (Patient not taking: Reported on 01/20/2016) 10 capsule 0  . polyethylene glycol powder (GLYCOLAX/MIRALAX) powder Take 17 g by mouth daily. (Patient not taking: Reported on 01/20/2016) 3350 g 1   No facility-administered medications prior to visit.    ROS Review of Systems  Constitutional: Negative for fever and chills.  Eyes: Positive for visual disturbance.  Respiratory: Negative for shortness of breath.   Cardiovascular: Negative for chest pain.  Gastrointestinal: Negative for abdominal pain and blood in stool.  Musculoskeletal: Negative for back pain and arthralgias.  Skin: Negative for rash.  Allergic/Immunologic: Negative for  immunocompromised state.  Neurological: Positive for headaches.  Hematological: Negative for adenopathy. Does not bruise/bleed easily.  Psychiatric/Behavioral: Negative for suicidal ideas and dysphoric mood.    Objective:  BP 103/70 mmHg  Pulse 76  Temp(Src) 99.2 F (37.3 C) (Oral)  Resp 16  Ht 5' 1.5" (1.562 m)  Wt 168 lb (76.204 kg)  BMI 31.23 kg/m2  SpO2 98%  BP/Weight 01/27/2016 01/20/2016 [id number]  Systolic BP XX123456 97 99  Diastolic BP 70 74 69  Wt. (Lbs) 168 - 169  BMI 31.23 - 31.95   Physical Exam  Constitutional: She is oriented to person, place, and time. She appears well-developed and well-nourished. No distress.  HENT:  Head: Normocephalic and atraumatic.    Cardiovascular: Normal rate, regular rhythm, normal heart sounds and intact distal pulses.   Pulmonary/Chest: Effort normal and breath sounds normal.  Musculoskeletal: She exhibits no edema.  Neurological: She is alert and oriented to person, place, and time.  Skin: Skin is warm and dry. No rash noted.  Psychiatric: She has a normal mood and affect.      Assessment & Plan:  April Hudson was seen today for cyst.  Diagnoses and all orders for this visit:  Epidermoid cyst -     diclofenac sodium (VOLTAREN) 1 % GEL; Apply 2 g topically 4 (four) times daily. -     acetaminophen-codeine (TYLENOL #3) 300-30 MG tablet; Take 1 tablet by mouth every 8 (eight) hours as needed.  Screen for STD (sexually transmitted disease) -     HIV antibody (  with reflex) -     RPR -     Urine cytology ancillary only  There are no diagnoses linked to this encounter.  No orders of the defined types were placed in this encounter.    Follow-up: No Follow-up on file.   Boykin Nearing MD

## 2016-01-27 NOTE — Assessment & Plan Note (Signed)
Tender cyst at temple Proceed with ENT for cystectomy   Add topical NSAID, cold compresses and tylenol #3 for pain control

## 2016-01-27 NOTE — Patient Instructions (Addendum)
April Hudson was seen today for cyst.  Diagnoses and all orders for this visit:  Epidermoid cyst -     diclofenac sodium (VOLTAREN) 1 % GEL; Apply 2 g topically 4 (four) times daily. -     acetaminophen-codeine (TYLENOL #3) 300-30 MG tablet; Take 1 tablet by mouth every 8 (eight) hours as needed.  Screen for STD (sexually transmitted disease) -     HIV antibody (with reflex) -     RPR -     Urine cytology ancillary only    Keep ENT appt Start ice/cold compress for pain along with voltaren gel and tylenol #3  F/u in 4 weeks   Dr. Adrian Blackwater

## 2016-01-28 DIAGNOSIS — A549 Gonococcal infection, unspecified: Secondary | ICD-10-CM | POA: Insufficient documentation

## 2016-01-28 DIAGNOSIS — A5901 Trichomonal vulvovaginitis: Secondary | ICD-10-CM | POA: Insufficient documentation

## 2016-01-28 LAB — URINE CYTOLOGY ANCILLARY ONLY
CHLAMYDIA, DNA PROBE: NEGATIVE
Neisseria Gonorrhea: POSITIVE — AB
Trichomonas: POSITIVE — AB

## 2016-01-28 LAB — HIV ANTIBODY (ROUTINE TESTING W REFLEX): HIV 1&2 Ab, 4th Generation: NONREACTIVE

## 2016-01-28 LAB — RPR

## 2016-01-28 MED ORDER — METRONIDAZOLE 500 MG PO TABS: 500.0000 mg | ORAL_TABLET | Freq: Two times a day (BID) | ORAL | Status: DC

## 2016-01-28 MED ORDER — FLUCONAZOLE 150 MG PO TABS: 150.0000 mg | ORAL_TABLET | Freq: Once | ORAL | Status: DC

## 2016-01-28 NOTE — Addendum Note (Signed)
Addended by: Boykin Nearing on: 01/28/2016 07:11 PM   Modules accepted: Orders

## 2016-01-29 ENCOUNTER — Telehealth: Payer: Self-pay | Admitting: *Deleted

## 2016-01-29 ENCOUNTER — Telehealth: Payer: Self-pay | Admitting: Family Medicine

## 2016-01-29 NOTE — Telephone Encounter (Signed)
Pt returning your call . Please, call her back  Thank you

## 2016-01-29 NOTE — Telephone Encounter (Signed)
-----   Message from Boykin Nearing, MD sent at 01/28/2016  7:08 PM EDT ----- Positive gonorrhea and trichomonas  Come in for treatment with rocephin 250 mg IM x 1 and azithromycin 1 g pO x 1 for gonorrhea  and flagyl sent for trichomonas Chlamydia negative  Screening HIV and RPR negative

## 2016-01-30 ENCOUNTER — Ambulatory Visit: Payer: Self-pay | Attending: Family Medicine | Admitting: *Deleted

## 2016-01-30 ENCOUNTER — Encounter: Payer: Self-pay | Admitting: Clinical

## 2016-01-30 DIAGNOSIS — A549 Gonococcal infection, unspecified: Secondary | ICD-10-CM | POA: Insufficient documentation

## 2016-01-30 MED ORDER — AZITHROMYCIN 500 MG PO TABS
1000.0000 mg | ORAL_TABLET | Freq: Once | ORAL | Status: AC
  Administered 2016-01-30: 1000 mg via ORAL

## 2016-01-30 MED ORDER — CEFTRIAXONE SODIUM 1 G IJ SOLR
250.0000 mg | Freq: Once | INTRAMUSCULAR | Status: AC
  Administered 2016-01-30: 250 mg via INTRAMUSCULAR

## 2016-01-30 NOTE — Telephone Encounter (Signed)
Date of birth verified by pt  Pt will stop by the office today. For Tx

## 2016-01-30 NOTE — Progress Notes (Signed)
Depression screen [facility] 2/9 01/27/2016 09/02/2015 08/01/2015 07/10/2015 06/27/2015  Decreased Interest 0 0 0 0 0  Down, Depressed, Hopeless 0 0 0 0 0  PHQ - 2 Score 0 0 0 0 0  Altered sleeping 3 - - - -  Tired, decreased energy 2 - - - -  Change in appetite 1 - - - -  Feeling bad or failure about yourself  0 - - - -  Trouble concentrating 0 - - - -  Moving slowly or fidgety/restless 0 - - - -  Suicidal thoughts 0 - - - -  PHQ-9 Score 6 - - - -    GAD 7 : Generalized Anxiety Score 01/27/2016  Nervous, Anxious, on Edge 0  Control/stop worrying 1  Worry too much - different things 1  Trouble relaxing 1  Restless 0  Easily annoyed or irritable 2  Afraid - awful might happen 0  Total GAD 7 Score 5

## 2016-01-30 NOTE — Progress Notes (Signed)
Pt here for STD Tx  Azithromycin 1000 mg given PO  Rocephin 250mg  INJ given, no negative reaction  Pt advised partner/s needs to go to PCP or health dep for Tx

## 2016-01-30 NOTE — Telephone Encounter (Signed)
Returned pt call  Communicable Disease form Fax to [facility]

## 2016-02-02 ENCOUNTER — Emergency Department (HOSPITAL_COMMUNITY)
Admission: EM | Admit: 2016-02-02 | Discharge: 2016-02-02 | Disposition: A | Discharge status: Home / Self Care | Payer: Self-pay | Attending: Emergency Medicine | Admitting: Emergency Medicine

## 2016-02-02 ENCOUNTER — Encounter (HOSPITAL_COMMUNITY): Payer: Self-pay | Admitting: *Deleted

## 2016-02-02 DIAGNOSIS — Z862 Personal history of diseases of the blood and blood-forming organs and certain disorders involving the immune mechanism: Secondary | ICD-10-CM | POA: Insufficient documentation

## 2016-02-02 DIAGNOSIS — Z8719 Personal history of other diseases of the digestive system: Secondary | ICD-10-CM | POA: Insufficient documentation

## 2016-02-02 DIAGNOSIS — F17210 Nicotine dependence, cigarettes, uncomplicated: Secondary | ICD-10-CM | POA: Insufficient documentation

## 2016-02-02 DIAGNOSIS — Z79899 Other long term (current) drug therapy: Secondary | ICD-10-CM | POA: Insufficient documentation

## 2016-02-02 DIAGNOSIS — Z792 Long term (current) use of antibiotics: Secondary | ICD-10-CM | POA: Insufficient documentation

## 2016-02-02 DIAGNOSIS — L0201 Cutaneous abscess of face: Secondary | ICD-10-CM | POA: Insufficient documentation

## 2016-02-02 DIAGNOSIS — J45909 Unspecified asthma, uncomplicated: Secondary | ICD-10-CM | POA: Insufficient documentation

## 2016-02-02 MED ORDER — CLINDAMYCIN HCL 150 MG PO CAPS
450.0000 mg | ORAL_CAPSULE | Freq: Three times a day (TID) | ORAL | Status: DC
Start: 2016-02-02 — End: 2016-02-24

## 2016-02-02 NOTE — ED Provider Notes (Signed)
CSN: [id number]     Arrival date & time 02/02/16  [id number] History   First MD Initiated Contact with Patient 02/02/16 [phone]     Chief Complaint  Patient presents with  . Facial Abcess      (Consider location/radiation/quality/duration/timing/severity/associated sxs/prior Treatment) HPI Comments: Patient presents today with a facial abscess in the area of the right temple.  She states that the abscess has been present for the past 3 weeks and is gradually becoming larger in size.  She has not had any drainage from the area.  She was seen for this abscess in the ED on 01/20/16.  She had the area incised at that time, but no drainage was expressed.  She also had a MRA at that time due to concern for temporal aneurysm or temporal arteritis.  Temporal artery was found to be patent at that time.  She reports associated blurred vision, but denies diplopia.  She denies fever or chills.  Denies nausea or vomiting.  She reports that she has an appointment with ENT in 2 days to have the area excised.    The history is provided by the patient.    Past Medical History  Diagnosis Date  . History of frequent urinary tract infections   . Smoker   . Joint pain   . History of gastric ulcer     clinical diagnosis, no prior EGD  . Migraines Dx 2013  . Anemia     since at least 2013  . Allergy   . GERD (gastroesophageal reflux disease) Dx 2015  . Asthma     as a child   Past Surgical History  Procedure Laterality Date  . Knee surgery    . Vaginal hysterectomy  2006    for fibroids and heavy menstrual bleeding    Family History  Problem Relation Age of Onset  . Diabetes Mother   . Hypertension Mother   . Stomach cancer Maternal Uncle   . Heart disease Sister   . Healthy Brother   . Asthma Son   . Asthma Daughter   . Colon cancer Maternal Uncle   . Ovarian cancer Mother    Social History  Substance Use Topics  . Smoking status: Current Every Day Smoker -- 1.00 packs/day for 30 years    Types:  Cigarettes  . Smokeless tobacco: Never Used     Comment: info given 06-16-15  . Alcohol Use: No   OB History    No data available     Review of Systems  All other systems reviewed and are negative.     Allergies  Sulfa antibiotics  Home Medications   Prior to Admission medications   Medication Sig Start Date End Date Taking? Authorizing Provider  acetaminophen-codeine (TYLENOL #3) 300-30 MG tablet Take 1 tablet by mouth every 8 (eight) hours as needed. 01/27/16  Yes Josalyn Funches, MD  diclofenac sodium (VOLTAREN) 1 % GEL Apply 2 g topically 4 (four) times daily. 01/27/16  Yes Josalyn Funches, MD  docusate sodium (COLACE) 100 MG capsule Take 1 capsule (100 mg total) by mouth 2 (two) times daily. 10/23/15  Yes Josalyn Funches, MD  fluconazole (DIFLUCAN) 150 MG tablet Take 1 tablet (150 mg total) by mouth once. 01/28/16  Yes Josalyn Funches, MD  metroNIDAZOLE (FLAGYL) 500 MG tablet Take 1 tablet (500 mg total) by mouth 2 (two) times daily. 01/28/16  Yes Josalyn Funches, MD  polyethylene glycol powder (GLYCOLAX/MIRALAX) powder Take 17 g by mouth daily. 10/23/15  Yes Josalyn  Funches, MD   BP 103/69 mmHg  Pulse 63  Temp(Src) 98.2 F (36.8 C) (Oral)  Resp 17  Ht 5\' 1"  (1.549 m)  Wt 74.844 kg  BMI 31.19 kg/m2  SpO2 98% Physical Exam  Constitutional: She appears well-developed and well-nourished.  HENT:  Head: Normocephalic and atraumatic.    Eyes: EOM are normal. Pupils are equal, round, and reactive to light.  Neck: Normal range of motion. Neck supple.  Cardiovascular: Normal rate, regular rhythm and normal heart sounds.   Pulmonary/Chest: Effort normal and breath sounds normal.  Musculoskeletal: Normal range of motion.  Neurological: She is alert. No cranial nerve deficit.  Skin: Skin is warm and dry.  Psychiatric: She has a normal mood and affect.  Nursing note and vitals reviewed.   ED Course  Procedures (including critical care time) Labs Review Labs Reviewed - No  data to display  Imaging Review No results found. I have personally reviewed and evaluated these images and lab results as part of my medical decision-making.   EKG Interpretation None     INCISION AND DRAINAGE Performed by: Hyman Bible Consent: Verbal consent obtained. Risks and benefits: risks, benefits and alternatives were discussed Type: abscess  Body area: right temporal  Anesthesia: none  Needle aspiration was performed with 18 G needle.  Complexity: complex  Drainage: purulent  Drainage amount: moderate  Patient tolerance: Patient tolerated the procedure well with no immediate complications.    MDM   Final diagnoses:  None   Patient presents today with an abscess to the right temple area.  Needle aspiration performed and purulent drainage was expressed. No surrounding cellulitis.  No fever or signs of systemic symptoms.  Patient started on antibiotics.  She has follow up with ENT in 2 days to have the area excised.  Patient stable for discharge.  Return precautions given.  Patient also evaluated by Dr Billy Fischer who is in agreement with the plan.      Hyman Bible, PA-C 02/03/16 [place], MD 02/05/16 2141

## 2016-02-02 NOTE — ED Notes (Signed)
Patient presents with large swollen area to the right temple that has been there for about 4 weeks but continues to get larger

## 2016-02-13 ENCOUNTER — Encounter (HOSPITAL_COMMUNITY): Payer: Self-pay | Admitting: Nurse Practitioner

## 2016-02-13 ENCOUNTER — Ambulatory Visit (HOSPITAL_COMMUNITY)
Admission: EM | Admit: 2016-02-13 | Discharge: 2016-02-13 | Disposition: A | Discharge status: Home / Self Care | Payer: Self-pay | Attending: Emergency Medicine | Admitting: Emergency Medicine

## 2016-02-13 DIAGNOSIS — F17210 Nicotine dependence, cigarettes, uncomplicated: Secondary | ICD-10-CM | POA: Insufficient documentation

## 2016-02-13 DIAGNOSIS — Z87440 Personal history of urinary (tract) infections: Secondary | ICD-10-CM | POA: Insufficient documentation

## 2016-02-13 DIAGNOSIS — Z882 Allergy status to sulfonamides status: Secondary | ICD-10-CM | POA: Insufficient documentation

## 2016-02-13 DIAGNOSIS — R197 Diarrhea, unspecified: Secondary | ICD-10-CM | POA: Insufficient documentation

## 2016-02-13 DIAGNOSIS — R1110 Vomiting, unspecified: Secondary | ICD-10-CM | POA: Insufficient documentation

## 2016-02-13 DIAGNOSIS — K219 Gastro-esophageal reflux disease without esophagitis: Secondary | ICD-10-CM | POA: Insufficient documentation

## 2016-02-13 MED ORDER — ONDANSETRON HCL 4 MG PO TABS: 4.0000 mg | ORAL_TABLET | Freq: Three times a day (TID) | ORAL | Status: DC | PRN

## 2016-02-13 MED ORDER — ONDANSETRON 4 MG PO TBDP
4.0000 mg | ORAL_TABLET | Freq: Once | ORAL | Status: AC
  Administered 2016-02-13: 4 mg via ORAL

## 2016-02-13 MED ORDER — ONDANSETRON 4 MG PO TBDP
ORAL_TABLET | ORAL | Status: AC
  Filled 2016-02-13: qty 1

## 2016-02-13 NOTE — ED Notes (Signed)
Pt c/o abd pain, n/v/d, chills since this morning. She reports her stool looked black. She tried some ginger ale with no relief. She is alert and breathing easily

## 2016-02-13 NOTE — ED Notes (Signed)
Patient was provided a kit to obtain stool sample, patient is to return the specimen on or before 1900

## 2016-02-13 NOTE — ED Provider Notes (Signed)
CSN: [id number]     Arrival date & time 02/13/16  1334 History   First MD Initiated Contact with Patient 02/13/16 1456     Chief Complaint  Patient presents with  . GI Problem   (Consider location/radiation/quality/duration/timing/severity/associated sxs/prior Treatment) HPI Comments: April Hudson is a 45 yo female who presents with "massive" diarrhea, nausea, emesis and discomfort to the right abdomen. No fever. Subjective chills. She reports that she has not felt well after staring clindamycin and doxycycline for a right temporal abscess. It has been 8 days on the medication. The pain is described as "cramping" with some relief to defecation. No urinary symtpoms.   Patient is a 45 y.o. female presenting with GI illness. The history is provided by the patient.  GI Problem Associated symptoms include abdominal pain.    Past Medical History  Diagnosis Date  . History of frequent urinary tract infections   . Smoker   . Joint pain   . History of gastric ulcer     clinical diagnosis, no prior EGD  . Migraines Dx 2013  . Anemia     since at least 2013  . Allergy   . GERD (gastroesophageal reflux disease) Dx 2015  . Asthma     as a child   Past Surgical History  Procedure Laterality Date  . Knee surgery    . Vaginal hysterectomy  2006    for fibroids and heavy menstrual bleeding    Family History  Problem Relation Age of Onset  . Diabetes Mother   . Hypertension Mother   . Stomach cancer Maternal Uncle   . Heart disease Sister   . Healthy Brother   . Asthma Son   . Asthma Daughter   . Colon cancer Maternal Uncle   . Ovarian cancer Mother    Social History  Substance Use Topics  . Smoking status: Current Every Day Smoker -- 1.00 packs/day for 30 years    Types: Cigarettes  . Smokeless tobacco: Never Used     Comment: info given 06-16-15  . Alcohol Use: No   OB History    No data available     Review of Systems  Constitutional: Positive for chills. Negative for  fever and fatigue.  Respiratory: Negative for cough.   Gastrointestinal: Positive for nausea, abdominal pain and diarrhea. Negative for constipation, blood in stool and abdominal distention.  Genitourinary: Negative.  Negative for urgency, flank pain, vaginal discharge and pelvic pain.  Musculoskeletal: Negative for back pain.    Allergies  Sulfa antibiotics  Home Medications   Prior to Admission medications   Medication Sig Start Date End Date Taking? Authorizing Provider  doxycycline (VIBRAMYCIN) 100 MG capsule Take by mouth 2 (two) times daily.   Yes Historical Provider, MD  FLORA-Q [facility]) CAPS capsule Take 1 capsule by mouth daily.   Yes Historical Provider, MD  ibuprofen (ADVIL,MOTRIN) 200 MG tablet Take 200 mg by mouth.   Yes Historical Provider, MD  acetaminophen-codeine (TYLENOL #3) 300-30 MG tablet Take 1 tablet by mouth every 8 (eight) hours as needed. 01/27/16   Josalyn Funches, MD  clindamycin (CLEOCIN) 150 MG capsule Take 3 capsules (450 mg total) by mouth 3 (three) times daily. 02/02/16   Heather Laisure, PA-C  diclofenac sodium (VOLTAREN) 1 % GEL Apply 2 g topically 4 (four) times daily. 01/27/16   Josalyn Funches, MD  docusate sodium (COLACE) 100 MG capsule Take 1 capsule (100 mg total) by mouth 2 (two) times daily. 10/23/15   Josalyn Funches,  MD  fluconazole (DIFLUCAN) 150 MG tablet Take 1 tablet (150 mg total) by mouth once. 01/28/16   Josalyn Funches, MD  metroNIDAZOLE (FLAGYL) 500 MG tablet Take 1 tablet (500 mg total) by mouth 2 (two) times daily. 01/28/16   Josalyn Funches, MD  ondansetron (ZOFRAN) 4 MG tablet Take 1 tablet (4 mg total) by mouth every 8 (eight) hours as needed for nausea or vomiting. 02/13/16   Bjorn Pippin, PA-C  polyethylene glycol powder (GLYCOLAX/MIRALAX) powder Take 17 g by mouth daily. 10/23/15   Boykin Nearing, MD   Meds Ordered and Administered this Visit   Medications  ondansetron (ZOFRAN-ODT) disintegrating tablet 4 mg (4 mg Oral Given  02/13/16 1525)    BP 104/68 mmHg  Pulse 85  Temp(Src) 98.9 F (37.2 C) (Oral)  Resp 16  SpO2 100% No data found.   Physical Exam  Constitutional: She is oriented to person, place, and time. She appears well-developed and well-nourished. No distress.  Abdominal: Soft. Bowel sounds are normal. She exhibits no distension and no mass. There is tenderness. There is no rebound and no guarding.  Tenderness to deep palpation along the right lower quadrant without rebound.   Neurological: She is alert and oriented to person, place, and time.  Skin: Skin is warm and dry. She is not diaphoretic. No erythema.  Psychiatric: Her behavior is normal.  Nursing note and vitals reviewed.   ED Course  Procedures (including critical care time)  Labs Review Labs Reviewed  GASTROINTESTINAL PANEL BY PCR, STOOL (REPLACES STOOL CULTURE)    Imaging Review No results found.   Visual Acuity Review  Right Eye Distance:   Left Eye Distance:   Bilateral Distance:    Right Eye Near:   Left Eye Near:    Bilateral Near:         MDM   1. Diarrhea, unspecified type   2. Vomiting and diarrhea    Exam shows a non-acute abdomen, patient is non-toxic. Symptoms appear to correlate with abx use.  Options discussed re: stopping med's, Zofran and time or ED for CT/further evaluation. Patient would like to stop medications and follow. We will also obtain a stool culture to ensure no C-diff (though stools reported to not be foul smelling). No blood. She is instructed to present to the ED if she worsens. Agreed.    Bjorn Pippin, PA-C 02/13/16 1554

## 2016-02-13 NOTE — Discharge Instructions (Signed)
All of this may be from your antibiotics. Stop them. Your wound looks well. We are going to test your stool for an infection which if positive will treat, we will call you. If your worsen then please go to the ED for further work up. Feel better.    Diarrhea Diarrhea is frequent loose and watery bowel movements. It can cause you to feel weak and dehydrated. Dehydration can cause you to become tired and thirsty, have a dry mouth, and have decreased urination that often is dark yellow. Diarrhea is a sign of another problem, most often an infection that will not last long. In most cases, diarrhea typically lasts 2-3 days. However, it can last longer if it is a sign of something more serious. It is important to treat your diarrhea as directed by your caregiver to lessen or prevent future episodes of diarrhea. CAUSES  Some common causes include:  Gastrointestinal infections caused by viruses, bacteria, or parasites.  Food poisoning or food allergies.  Certain medicines, such as antibiotics, chemotherapy, and laxatives.  Artificial sweeteners and fructose.  Digestive disorders. HOME CARE INSTRUCTIONS  Ensure adequate fluid intake (hydration): Have 1 cup (8 oz) of fluid for each diarrhea episode. Avoid fluids that contain simple sugars or sports drinks, fruit juices, whole milk products, and sodas. Your urine should be clear or pale yellow if you are drinking enough fluids. Hydrate with an oral rehydration solution that you can purchase at pharmacies, retail stores, and online. You can prepare an oral rehydration solution at home by mixing the following ingredients together:   - tsp table salt.   tsp baking soda.   tsp salt substitute containing potassium chloride.  1  tablespoons sugar.  1 L (34 oz) of water.  Certain foods and beverages may increase the speed at which food moves through the gastrointestinal (GI) tract. These foods and beverages should be avoided and include:  Caffeinated  and alcoholic beverages.  High-fiber foods, such as raw fruits and vegetables, nuts, seeds, and whole grain breads and cereals.  Foods and beverages sweetened with sugar alcohols, such as xylitol, sorbitol, and mannitol.  Some foods may be well tolerated and may help thicken stool including:  Starchy foods, such as rice, toast, pasta, low-sugar cereal, oatmeal, grits, baked potatoes, crackers, and bagels.  Bananas.  Applesauce.  Add probiotic-rich foods to help increase healthy bacteria in the GI tract, such as yogurt and fermented milk products.  Wash your hands well after each diarrhea episode.  Only take over-the-counter or prescription medicines as directed by your caregiver.  Take a warm bath to relieve any burning or pain from frequent diarrhea episodes. SEEK IMMEDIATE MEDICAL CARE IF:   You are unable to keep fluids down.  You have persistent vomiting.  You have blood in your stool, or your stools are black and tarry.  You do not urinate in 6-8 hours, or there is only a small amount of very dark urine.  You have abdominal pain that increases or localizes.  You have weakness, dizziness, confusion, or light-headedness.  You have a severe headache.  Your diarrhea gets worse or does not get better.  You have a fever or persistent symptoms for more than 2-3 days.  You have a fever and your symptoms suddenly get worse. MAKE SURE YOU:   Understand these instructions.  Will watch your condition.  Will get help right away if you are not doing well or get worse.   This information is not intended to replace advice  given to you by your health care provider. Make sure you discuss any questions you have with your health care provider.   Document Released: 09/10/2002 Document Revised: 10/11/2014 Document Reviewed: 05/28/2012 Elsevier Interactive Patient Education [employer].

## 2016-02-14 LAB — GASTROINTESTINAL PANEL BY PCR, STOOL (REPLACES STOOL CULTURE)
ASTROVIRUS: NOT DETECTED
Adenovirus F40/41: NOT DETECTED
Campylobacter species: NOT DETECTED
Cryptosporidium: NOT DETECTED
Cyclospora cayetanensis: NOT DETECTED
E. COLI O157: NOT DETECTED
ENTAMOEBA HISTOLYTICA: NOT DETECTED
ENTEROTOXIGENIC E COLI (ETEC): NOT DETECTED
Enteroaggregative E coli (EAEC): NOT DETECTED
Enteropathogenic E coli (EPEC): NOT DETECTED
Giardia lamblia: NOT DETECTED
NOROVIRUS GI/GII: NOT DETECTED
Plesimonas shigelloides: NOT DETECTED
ROTAVIRUS A: NOT DETECTED
SALMONELLA SPECIES: NOT DETECTED
SAPOVIRUS (I, II, IV, AND V): NOT DETECTED
SHIGA LIKE TOXIN PRODUCING E COLI (STEC): NOT DETECTED
SHIGELLA/ENTEROINVASIVE E COLI (EIEC): NOT DETECTED
VIBRIO CHOLERAE: NOT DETECTED
Vibrio species: NOT DETECTED
Yersinia enterocolitica: NOT DETECTED

## 2016-02-24 ENCOUNTER — Ambulatory Visit: Payer: Self-pay | Admitting: Family Medicine

## 2016-02-24 ENCOUNTER — Ambulatory Visit (AMBULATORY_SURGERY_CENTER): Payer: Self-pay | Admitting: *Deleted

## 2016-02-24 VITALS — Ht 61.5 in | Wt 166.0 lb

## 2016-02-24 DIAGNOSIS — Z86010 Personal history of colonic polyps: Secondary | ICD-10-CM

## 2016-02-24 NOTE — Progress Notes (Signed)
No egg or soy allergy known to patient  No issues with past sedation with any surgeries  or procedures, no intubation problems  No diet pills per patient No home 02 use per patient  No blood thinners per patient  Pt states issues with constipation but uses dulcolax and miralax as needed

## 2016-03-09 ENCOUNTER — Encounter: Payer: Self-pay | Admitting: Gastroenterology

## 2016-03-09 ENCOUNTER — Ambulatory Visit (AMBULATORY_SURGERY_CENTER): Payer: Self-pay | Admitting: Gastroenterology

## 2016-03-09 VITALS — BP 115/77 | HR 79 | Temp 97.8°F | Resp 19 | Ht 61.5 in | Wt 166.0 lb

## 2016-03-09 DIAGNOSIS — D128 Benign neoplasm of rectum: Secondary | ICD-10-CM

## 2016-03-09 DIAGNOSIS — D129 Benign neoplasm of anus and anal canal: Secondary | ICD-10-CM

## 2016-03-09 DIAGNOSIS — Z86010 Personal history of colonic polyps: Secondary | ICD-10-CM

## 2016-03-09 MED ORDER — SODIUM CHLORIDE 0.9 % IV SOLN: 500.0000 mL | INTRAVENOUS | Status: DC

## 2016-03-09 NOTE — Progress Notes (Signed)
Called to room to assist during endoscopic procedure.  Patient ID and intended procedure confirmed with present staff. Received instructions for my participation in the procedure from the performing physician.  

## 2016-03-09 NOTE — Op Note (Signed)
[facility] Patient Name: April Hudson Procedure Date: 03/09/2016 8:59 AM MRN: [id number] Endoscopist: Milus Banister , MD Age: 45 Referring MD:  Date of Birth: 1971/05/05 Gender: Female Procedure:                Flexible Sigmoidoscopy Indications:              High risk colon cancer surveillance: Personal                            history of colonic polyps (4cm TVA with focal HGD                            removed piecemeal fashion from rectum 06/2015) Medicines:                Monitored Anesthesia Care Procedure:                Pre-Anesthesia Assessment:                           - Prior to the procedure, a History and Physical                            was performed, and patient medications and                            allergies were reviewed. The patient's tolerance of                            previous anesthesia was also reviewed. The risks                            and benefits of the procedure and the sedation                            options and risks were discussed with the patient.                            All questions were answered, and informed consent                            was obtained. Prior Anticoagulants: The patient has                            taken no previous anticoagulant or antiplatelet                            agents. ASA Grade Assessment: II - A patient with                            mild systemic disease. After reviewing the risks                            and benefits, the patient was deemed in  satisfactory condition to undergo the procedure.                           After obtaining informed consent, the scope was                            passed under direct vision. The Model CF-HQ190L                            [id number]) scope was introduced through the anus                            and advanced to the the splenic flexure. The                            flexible sigmoidoscopy was  accomplished without                            difficulty. The patient tolerated the procedure                            well. The quality of the bowel preparation was good. Scope In: Scope Out: Findings:                 A 10 mm polyp was found in the rectum at the site                            of 2016 piecemeal resection, tattoo. The polyp was                            sessile, not malignant appearing. The polyp was                            removed with a hot snare with piecemeal resection.                            Resection and retrieval were apparently complete.                           The exam was otherwise without abnormality. Complications:            No immediate complications. Estimated blood loss:                            None. Estimated Blood Loss:     Estimated blood loss: none. Impression:               - Residual (vs. recurrent) adenoma at the site of                            2016 rectal polypectomy, removed with snare/cautery                            in piecemeal fashion.                           -  The examination was otherwise normal. Recommendation:           - Discharge patient to home.                           - Repeat flexible sigmoidoscopy for surveillance                            based on pathology results (probably in 6-12 months) Milus Banister, MD 03/09/2016 9:22:33 AM This report has been signed electronically.

## 2016-03-09 NOTE — Progress Notes (Signed)
Patient awakening,vss,report to rn 

## 2016-03-09 NOTE — Patient Instructions (Signed)
Discharge instructions given. Handouts on polyps. Resume previous medications. YOU HAD AN ENDOSCOPIC PROCEDURE TODAY AT THE [facility]:   Refer to the procedure report that was given to you for any specific questions about what was found during the examination.  If the procedure report does not answer your questions, please call your gastroenterologist to clarify.  If you requested that your care partner not be given the details of your procedure findings, then the procedure report has been included in a sealed envelope for you to review at your convenience later.  YOU SHOULD EXPECT: Some feelings of bloating in the abdomen. Passage of more gas than usual.  Walking can help get rid of the air that was put into your GI tract during the procedure and reduce the bloating. If you had a lower endoscopy (such as a colonoscopy or flexible sigmoidoscopy) you may notice spotting of blood in your stool or on the toilet paper. If you underwent a bowel prep for your procedure, you may not have a normal bowel movement for a few days.  Please Note:  You might notice some irritation and congestion in your nose or some drainage.  This is from the oxygen used during your procedure.  There is no need for concern and it should clear up in a day or so.  SYMPTOMS TO REPORT IMMEDIATELY:   Following lower endoscopy (colonoscopy or flexible sigmoidoscopy):  Excessive amounts of blood in the stool  Significant tenderness or worsening of abdominal pains  Swelling of the abdomen that is new, acute  Fever of 100F or higher   For urgent or emergent issues, a gastroenterologist can be reached at any hour by calling [phone].   DIET: Your first meal following the procedure should be a small meal and then it is ok to progress to your normal diet. Heavy or fried foods are harder to digest and may make you feel nauseous or bloated.  Likewise, meals heavy in dairy and vegetables can increase bloating.  Drink  plenty of fluids but you should avoid alcoholic beverages for 24 hours.  ACTIVITY:  You should plan to take it easy for the rest of today and you should NOT DRIVE or use heavy machinery until tomorrow (because of the sedation medicines used during the test).    FOLLOW UP: Our staff will call the number listed on your records the next business day following your procedure to check on you and address any questions or concerns that you may have regarding the information given to you following your procedure. If we do not reach you, we will leave a message.  However, if you are feeling well and you are not experiencing any problems, there is no need to return our call.  We will assume that you have returned to your regular daily activities without incident.  If any biopsies were taken you will be contacted by phone or by letter within the next 1-3 weeks.  Please call us at [phone] if you have not heard about the biopsies in 3 weeks.    SIGNATURES/CONFIDENTIALITY: You and/or your care partner have signed paperwork which will be entered into your electronic medical record.  These signatures attest to the fact that that the information above on your After Visit Summary has been reviewed and is understood.  Full responsibility of the confidentiality of this discharge information lies with you and/or your care-partner. 

## 2016-03-09 NOTE — Progress Notes (Signed)
No egg or soy allergy known to patient  No issues with past sedation with any surgeries  or procedures, no intubation problems  No diet pills per patient No home 02 use per patient  No blood thinners per patient  Pt denies issues with constipation   

## 2016-03-10 ENCOUNTER — Telehealth: Payer: Self-pay | Admitting: *Deleted

## 2016-03-10 NOTE — Telephone Encounter (Signed)
  Follow up Call-  Call back number 03/09/2016 06/17/2015  Post procedure Call Back phone  # [phone]  Permission to leave phone message Yes Yes     Patient questions:  Do you have a fever, pain , or abdominal swelling? Yes.   Pain Score  3  Have you tolerated food without any problems? Yes.    Have you been able to return to your normal activities? Yes.    Do you have any questions about your discharge instructions: Diet   No. Medications  No. Follow up visit  No.  Do you have questions or concerns about your Care? No.  Actions: * If pain score is 4 or above:  Pt states she is "having trouble passing air."  Rates abd pain as a "3" this a.m., states it is much better than last night.  I encouraged her to ambulate and drink warm fluids to help pass the air.  Understanding voiced and pt will call back if pain increases  No action needed, pain <4.

## 2016-03-16 ENCOUNTER — Encounter: Payer: Self-pay | Admitting: Gastroenterology

## 2016-04-09 ENCOUNTER — Emergency Department (HOSPITAL_COMMUNITY)
Admission: EM | Admit: 2016-04-09 | Discharge: 2016-04-09 | Disposition: A | Discharge status: Home / Self Care | Payer: Self-pay | Attending: Emergency Medicine | Admitting: Emergency Medicine

## 2016-04-09 ENCOUNTER — Encounter (HOSPITAL_COMMUNITY): Payer: Self-pay | Admitting: Emergency Medicine

## 2016-04-09 DIAGNOSIS — J45909 Unspecified asthma, uncomplicated: Secondary | ICD-10-CM | POA: Insufficient documentation

## 2016-04-09 DIAGNOSIS — R05 Cough: Secondary | ICD-10-CM

## 2016-04-09 DIAGNOSIS — J029 Acute pharyngitis, unspecified: Secondary | ICD-10-CM | POA: Insufficient documentation

## 2016-04-09 DIAGNOSIS — F17210 Nicotine dependence, cigarettes, uncomplicated: Secondary | ICD-10-CM | POA: Insufficient documentation

## 2016-04-09 DIAGNOSIS — R059 Cough, unspecified: Secondary | ICD-10-CM

## 2016-04-09 MED ORDER — LIDOCAINE VISCOUS 2 % MT SOLN: 20.0000 mL | OROMUCOSAL | Status: DC | PRN

## 2016-04-09 MED ORDER — BENZONATATE 100 MG PO CAPS: 100.0000 mg | ORAL_CAPSULE | Freq: Three times a day (TID) | ORAL | Status: DC

## 2016-04-09 MED ORDER — LIDOCAINE VISCOUS 2 % MT SOLN
15.0000 mL | Freq: Once | OROMUCOSAL | Status: AC
  Administered 2016-04-09: 15 mL via OROMUCOSAL
  Filled 2016-04-09: qty 15

## 2016-04-09 NOTE — ED Notes (Signed)
Pt states "my throat is really sore and it makes it difficult to swallow." x3 weeks. Denies any other symptoms.

## 2016-04-09 NOTE — ED Provider Notes (Signed)
CSN: [id number]     Arrival date & time 04/09/16  1259 History   By signing my name below, I, Randa Evens, attest that this documentation has been prepared under the direction and in the presence of Recardo Evangelist, PA-C. Electronically Signed: Randa Evens, ED Scribe. 04/09/2016. 1:14 PM.    Chief Complaint  Patient presents with  . Sore Throat   Patient is a 45 y.o. female presenting with pharyngitis. The history is provided by the patient. No language interpreter was used.  Sore Throat Pertinent negatives include no headaches and no shortness of breath.   HPI Comments: April Hudson is a 45 y.o. female who presents to the Emergency Department complaining of constant sore throat onset 3 weeks prior. PMH significant for tobacco use. Reports associated chills, cough, and voice change. States that the pain is worse when swallowing and coughing. She states she has tried cough drops with no relief. She had a sore throat first and then the cough started a week after which exacerbated the throat. She denies fever, HA, ear pain, rhinorrhea, inability to swallow, SOB, or wheezing. Pt reports daily tobacco use. Denies hx of seasonal allergies, asthma/COPD, or reflux (however this is listed in her chart under medical history).   Past Medical History  Diagnosis Date  . History of frequent urinary tract infections   . Smoker   . Joint pain   . History of gastric ulcer     clinical diagnosis, no prior EGD  . Migraines Dx 2013  . Anemia     since at least 2013  . Allergy   . GERD (gastroesophageal reflux disease) Dx 2015  . Asthma     as a child   Past Surgical History  Procedure Laterality Date  . Knee surgery    . Vaginal hysterectomy  2006    for fibroids and heavy menstrual bleeding   . Colonoscopy    . Polypectomy     Family History  Problem Relation Age of Onset  . Diabetes Mother   . Hypertension Mother   . Ovarian cancer Mother   . Stomach cancer Maternal Uncle   .  Heart disease Sister   . Healthy Brother   . Asthma Son   . Asthma Daughter   . Colon cancer Maternal Uncle   . Colon polyps Neg Hx   . Esophageal cancer Neg Hx   . Rectal cancer Neg Hx    Social History  Substance Use Topics  . Smoking status: Current Every Day Smoker -- 1.00 packs/day for 30 years    Types: Cigarettes  . Smokeless tobacco: Never Used     Comment: info given 06-16-15  . Alcohol Use: No   OB History    No data available      Review of Systems  Constitutional: Positive for chills. Negative for fever.  HENT: Positive for sore throat and voice change. Negative for ear pain, rhinorrhea and trouble swallowing.   Respiratory: Positive for cough. Negative for shortness of breath and wheezing.   Neurological: Negative for headaches.  All other systems reviewed and are negative.    Allergies  Sulfa antibiotics  Home Medications   Prior to Admission medications   Medication Sig Start Date End Date Taking? Authorizing Provider  acetaminophen-codeine (TYLENOL #3) 300-30 MG tablet Take 1 tablet by mouth every 8 (eight) hours as needed. 01/27/16   Josalyn Funches, MD  diclofenac sodium (VOLTAREN) 1 % GEL Apply 2 g topically 4 (four) times daily. Patient not  taking: Reported on 03/09/2016 01/27/16   Boykin Nearing, MD  docusate sodium (COLACE) 100 MG capsule Take 1 capsule (100 mg total) by mouth 2 (two) times daily. 10/23/15   Josalyn Funches, MD  FLORA-Q (FLORA-Q) CAPS capsule Take 1 capsule by mouth daily. Reported on 02/24/2016    Historical Provider, MD  ibuprofen (ADVIL,MOTRIN) 200 MG tablet Take 200 mg by mouth.    Historical Provider, MD  ondansetron (ZOFRAN) 4 MG tablet Take 1 tablet (4 mg total) by mouth every 8 (eight) hours as needed for nausea or vomiting. 02/13/16   Bjorn Pippin, PA-C  polyethylene glycol powder (GLYCOLAX/MIRALAX) powder Take 17 g by mouth daily. 10/23/15   Josalyn Funches, MD   BP 109/78 mmHg  Pulse 97  Temp(Src) 97.9 F (36.6 C) (Oral)   Resp 16  SpO2 100%   Physical Exam  Constitutional: She is oriented to person, place, and time. She appears well-developed and well-nourished. No distress.  HENT:  Head: Normocephalic and atraumatic.  Right Ear: Hearing, tympanic membrane, external ear and ear canal normal.  Left Ear: Hearing, tympanic membrane, external ear and ear canal normal.  Nose: No mucosal edema or rhinorrhea.  Mouth/Throat: Uvula is midline, oropharynx is clear and moist and mucous membranes are normal. No oropharyngeal exudate, posterior oropharyngeal edema, posterior oropharyngeal erythema or tonsillar abscesses.  Hoarse voice  Eyes: Conjunctivae are normal. Pupils are equal, round, and reactive to light. Right eye exhibits no discharge. Left eye exhibits no discharge. No scleral icterus.  Neck: Normal range of motion.  Cardiovascular: Normal rate and regular rhythm.  Exam reveals no gallop and no friction rub.   No murmur heard. Pulmonary/Chest: Effort normal and breath sounds normal. No respiratory distress. She has no wheezes. She has no rales. She exhibits no tenderness.  Abdominal: She exhibits no distension.  Neurological: She is alert and oriented to person, place, and time.  Skin: Skin is warm and dry.  Psychiatric: She has a normal mood and affect. Her behavior is normal.    ED Course  Procedures (including critical care time) DIAGNOSTIC STUDIES: Oxygen Saturation is 100% on RA, normal by my interpretation.    COORDINATION OF CARE: 1:12 PM-Discussed treatment plan which includes viscous lidocaine with pt at bedside and pt agreed to plan.     Labs Review Labs Reviewed - No data to display  Imaging Review No results found.    EKG Interpretation None      MDM   Final diagnoses:  Sore throat  Cough   45 year old female presents with a sore throat. It's possible it started as a viral illness which has been exacerbated by a cough for the past couple weeks. She is also a current  smoker and has hx of GERD however she is not treated for this and is denying current reflux symptoms. Lungs are CTA. She is afebrile. Will treat symptomatically with viscous lidocaine and tessalon. Patient is NAD, non-toxic, with stable VS. Patient is informed of clinical course, understands medical decision making process, and agrees with plan. Opportunity for questions provided and all questions answered. Return precautions given.  I personally performed the services described in this documentation, which was scribed in my presence. The recorded information has been reviewed and is accurate.      Recardo Evangelist, PA-C 04/09/16 1758  Veryl Speak, MD 04/10/16 [phone]

## 2016-05-07 ENCOUNTER — Encounter (HOSPITAL_COMMUNITY): Payer: Self-pay | Admitting: Emergency Medicine

## 2016-05-07 ENCOUNTER — Emergency Department (HOSPITAL_COMMUNITY)
Admission: EM | Admit: 2016-05-07 | Discharge: 2016-05-07 | Disposition: A | Discharge status: Home / Self Care | Payer: Self-pay | Attending: Emergency Medicine | Admitting: Emergency Medicine

## 2016-05-07 DIAGNOSIS — R197 Diarrhea, unspecified: Secondary | ICD-10-CM

## 2016-05-07 DIAGNOSIS — F17210 Nicotine dependence, cigarettes, uncomplicated: Secondary | ICD-10-CM | POA: Insufficient documentation

## 2016-05-07 DIAGNOSIS — K529 Noninfective gastroenteritis and colitis, unspecified: Secondary | ICD-10-CM | POA: Insufficient documentation

## 2016-05-07 DIAGNOSIS — J45909 Unspecified asthma, uncomplicated: Secondary | ICD-10-CM | POA: Insufficient documentation

## 2016-05-07 DIAGNOSIS — Z79899 Other long term (current) drug therapy: Secondary | ICD-10-CM | POA: Insufficient documentation

## 2016-05-07 DIAGNOSIS — R1030 Lower abdominal pain, unspecified: Secondary | ICD-10-CM

## 2016-05-07 LAB — URINALYSIS, ROUTINE W REFLEX MICROSCOPIC
BILIRUBIN URINE: NEGATIVE
GLUCOSE, UA: NEGATIVE mg/dL
Ketones, ur: NEGATIVE mg/dL
Leukocytes, UA: NEGATIVE
Nitrite: NEGATIVE
PROTEIN: NEGATIVE mg/dL
Specific Gravity, Urine: 1.009 (ref 1.005–1.030)
pH: 6 (ref 5.0–8.0)

## 2016-05-07 LAB — LIPASE, BLOOD: Lipase: 29 U/L (ref 11–51)

## 2016-05-07 LAB — URINE MICROSCOPIC-ADD ON: BACTERIA UA: NONE SEEN

## 2016-05-07 LAB — COMPREHENSIVE METABOLIC PANEL
ALK PHOS: 55 U/L (ref 38–126)
ALT: 14 U/L (ref 14–54)
ANION GAP: 8 (ref 5–15)
AST: 22 U/L (ref 15–41)
Albumin: 4 g/dL (ref 3.5–5.0)
BILIRUBIN TOTAL: 0.3 mg/dL (ref 0.3–1.2)
BUN: <5 mg/dL — ABNORMAL LOW (ref 6–20)
CALCIUM: 9.4 mg/dL (ref 8.9–10.3)
CO2: 23 mmol/L (ref 22–32)
Chloride: 105 mmol/L (ref 101–111)
Creatinine, Ser: 0.79 mg/dL (ref 0.44–1.00)
GFR calc non Af Amer: >60 mL/min (ref >60)
Glucose, Bld: 90 mg/dL (ref 65–99)
Potassium: 4 mmol/L (ref 3.5–5.1)
SODIUM: 136 mmol/L (ref 135–145)
TOTAL PROTEIN: 7.3 g/dL (ref 6.5–8.1)

## 2016-05-07 LAB — CBC
HCT: 50.6 % — ABNORMAL HIGH (ref 36.0–46.0)
HEMOGLOBIN: 17.1 g/dL — AB (ref 12.0–15.0)
MCH: 32.7 pg (ref 26.0–34.0)
MCHC: 33.8 g/dL (ref 30.0–36.0)
MCV: 96.7 fL (ref 78.0–100.0)
PLATELETS: 184 10*3/uL (ref 150–400)
RBC: 5.23 MIL/uL — AB (ref 3.87–5.11)
RDW: 14.1 % (ref 11.5–15.5)
WBC: 4.3 10*3/uL (ref 4.0–10.5)

## 2016-05-07 MED ORDER — HYDROMORPHONE HCL 1 MG/ML IJ SOLN
0.5000 mg | Freq: Once | INTRAMUSCULAR | Status: AC
  Administered 2016-05-07: 0.5 mg via INTRAVENOUS
  Filled 2016-05-07: qty 1

## 2016-05-07 MED ORDER — CIPROFLOXACIN HCL 500 MG PO TABS: 500.0000 mg | ORAL_TABLET | Freq: Two times a day (BID) | ORAL | 0 refills | Status: DC

## 2016-05-07 MED ORDER — METRONIDAZOLE 500 MG PO TABS: 500.0000 mg | ORAL_TABLET | Freq: Two times a day (BID) | ORAL | 0 refills | Status: DC

## 2016-05-07 MED ORDER — SODIUM CHLORIDE 0.9 % IV BOLUS (SEPSIS)
1000.0000 mL | Freq: Once | INTRAVENOUS | Status: AC
  Administered 2016-05-07: 1000 mL via INTRAVENOUS

## 2016-05-07 NOTE — ED Provider Notes (Addendum)
[facility] DEPT Provider Note   CSN: [id number] Arrival date & time: 05/07/16  1146  First Provider Contact:  First MD Initiated Contact with Patient 05/07/16 1255        History   Chief Complaint Chief Complaint  Patient presents with  . Abdominal Pain  . Diarrhea    [name] is a 45 y.o. female.  45 yo female with history or GERD presents to the hospital today with LLQ and suprapubic abdominal pain and diarrhea. Patient states that the abd pain and diarrhea started yesterday and has gradually gotten worse. She has have several episodes of non- watery or bloody diarrhea since yesterday, approx 10. She describes the pain as sharp that is a constant 10/10.  Nothing makes better or worse. She has tried [employer] and ibuprofen without relief.   She denies any fevers, chills, HA, cp, sob, nausea, vomiting, hematochezia, melena, hematuria, urinary symptoms, weakness, dizziness, vaginal bleeding, or discharge.   Patient denies any new foods, traveling outside of the country, surgeries except for hysterectomy, recent hospitalization, recent abx use, sick contacts. Denies association with fatty foods. No family history of inflammatory bowel disease.   The history is provided by the patient.  Abdominal Pain  Associated symptoms include diarrhea. Pertinent negatives include fever, nausea, vomiting, dysuria, frequency, hematuria, headaches and arthralgias.    Past Medical History:  Diagnosis Date  . Allergy   . Anemia    since at least 2013  . Asthma    as a child  . GERD (gastroesophageal reflux disease) Dx 2015  . History of frequent urinary tract infections   . History of gastric ulcer    clinical diagnosis, no prior EGD  . Joint pain   . Migraines Dx 2013  . Smoker     Patient Active Problem List   Diagnosis Date Noted  . Gonorrhea in female 01/28/2016  . Trichomonal vaginitis 01/28/2016  . Constipation 10/30/2015  . Anal fissure 10/23/2015  .  Epidermoid cyst 09/02/2015  . 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 Tinea pedis 05/26/2015  . Pain of left thumb 05/26/2015  . 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 De Quervain's tenosynovitis, left 04/14/2015  . Neck pain on right side 01/13/2015  . Carpal tunnel syndrome 12/30/2014  . Smoker 07/03/2014  . Family history of stomach cancer 07/03/2014    Past Surgical History:  Procedure Laterality Date  . COLONOSCOPY    . KNEE SURGERY    . POLYPECTOMY    . VAGINAL HYSTERECTOMY  2006   for fibroids and heavy menstrual bleeding     OB History    No data available       Home Medications    Prior to Admission medications   Medication Sig Start Date End Date Taking? Authorizing Provider  ibuprofen (ADVIL,MOTRIN) 200 MG tablet Take 400 mg by mouth every 6 (six) hours as needed for headache or moderate pain.    Yes Historical Provider, MD  acetaminophen-codeine (TYLENOL #3) 300-30 MG tablet Take 1 tablet by mouth every 8 (eight) hours as needed. Patient not taking: Reported on 05/07/2016 01/27/16   Boykin Nearing, MD  benzonatate (TESSALON) 100 MG capsule Take 1 capsule (100 mg total) by mouth every 8 (eight) hours. Patient not taking: Reported on 05/07/2016 04/09/16   Recardo Evangelist, PA-C  ciprofloxacin (CIPRO) 500 MG tablet Take 1 tablet (500 mg total) by mouth 2 (two) times daily. 05/07/16   Doristine Devoid, PA-C  docusate sodium (COLACE)  100 MG capsule Take 1 capsule (100 mg total) by mouth 2 (two) times daily. Patient not taking: Reported on 05/07/2016 10/23/15   Boykin Nearing, MD  lidocaine (XYLOCAINE) 2 % solution Use as directed 20 mLs in the mouth or throat as needed for mouth pain. Patient not taking: Reported on 05/07/2016 04/09/16   Recardo Evangelist, PA-C  metroNIDAZOLE (FLAGYL) 500 MG tablet Take 1 tablet (500 mg total) by mouth 2 (two) times daily with a meal. DO NOT CONSUME ALCOHOL WHILE TAKING THIS MEDICATION. 05/07/16   Doristine Devoid, PA-C    ondansetron (ZOFRAN) 4 MG tablet Take 1 tablet (4 mg total) by mouth every 8 (eight) hours as needed for nausea or vomiting. Patient not taking: Reported on 05/07/2016 02/13/16   Bjorn Pippin, PA-C  polyethylene glycol powder (GLYCOLAX/MIRALAX) powder Take 17 g by mouth daily. Patient not taking: Reported on 05/07/2016 10/23/15   Boykin Nearing, MD    Family History Family History  Problem Relation Age of Onset  . Diabetes Mother   . Hypertension Mother   . Ovarian cancer Mother   . Stomach cancer Maternal Uncle   . Heart disease Sister   . Healthy Brother   . Asthma Son   . Asthma Daughter   . Colon cancer Maternal Uncle   . Colon polyps Neg Hx   . Esophageal cancer Neg Hx   . Rectal cancer Neg Hx     Social History Social History  Substance Use Topics  . Smoking status: Current Every Day Smoker    Packs/day: 1.00    Years: 30.00    Types: Cigarettes  . Smokeless tobacco: Never Used     Comment: info given 06-16-15  . Alcohol use No     Allergies   Sulfa antibiotics   Review of Systems Review of Systems  Constitutional: Negative for chills and fever.  HENT: Negative for congestion, ear pain, rhinorrhea and sore throat.   Eyes: Negative for pain and visual disturbance.  Respiratory: Negative for cough and shortness of breath.   Cardiovascular: Negative for chest pain and palpitations.  Gastrointestinal: Positive for abdominal pain and diarrhea. Negative for blood in stool, nausea and vomiting.  Genitourinary: Negative for dysuria, frequency, hematuria and urgency.  Musculoskeletal: Negative for arthralgias and back pain.  Skin: Negative for color change and rash.  Neurological: Negative for dizziness, syncope, weakness, light-headedness, numbness and headaches.  All other systems reviewed and are negative.    Physical Exam Updated Vital Signs BP 101/74   Pulse 78   Temp 98.8 F (37.1 C) (Oral)   Resp 19   Ht 5\' 1"  (1.549 m)   Wt 70.7 kg   SpO2 96%    BMI 29.44 kg/m   Physical Exam  Constitutional: She appears well-developed and well-nourished. No distress.  HENT:  Head: Normocephalic and atraumatic.  Mouth/Throat: Uvula is midline, oropharynx is clear and moist and mucous membranes are normal.  Eyes: Conjunctivae are normal. Right eye exhibits no discharge. Left eye exhibits no discharge. No scleral icterus.  Neck: Normal range of motion. Neck supple. No thyromegaly present.  Cardiovascular: Normal rate, regular rhythm, normal heart sounds and intact distal pulses.  Exam reveals no gallop and no friction rub.   No murmur heard. Pulmonary/Chest: Effort normal and breath sounds normal. No respiratory distress. She has no wheezes. She has no rales.  Abdominal: Soft. She exhibits no distension. Bowel sounds are increased. There is tenderness in the suprapubic area and left lower quadrant. There  is no rigidity, no rebound, no guarding, no CVA tenderness and no tenderness at McBurney's point.  Musculoskeletal: Normal range of motion.  Lymphadenopathy:    She has no cervical adenopathy.  Neurological: She is alert.  Skin: Skin is warm and dry. Capillary refill takes less than 2 seconds.  Vitals reviewed.    ED Treatments / Results  Labs (all labs ordered are listed, but only abnormal results are displayed) Labs Reviewed  COMPREHENSIVE METABOLIC PANEL - Abnormal; Notable for the following:       Result Value   BUN <5 (*)    All other components within normal limits  CBC - Abnormal; Notable for the following:    RBC 5.23 (*)    Hemoglobin 17.1 (*)    HCT 50.6 (*)    All other components within normal limits  URINALYSIS, ROUTINE W REFLEX MICROSCOPIC (NOT AT [facility]) - Abnormal; Notable for the following:    Hgb urine dipstick LARGE (*)    All other components within normal limits  URINE MICROSCOPIC-ADD ON - Abnormal; Notable for the following:    Squamous Epithelial / LPF 0-5 (*)    All other components within normal limits  LIPASE,  BLOOD    EKG  EKG Interpretation None       Radiology No results found.  Procedures Procedures (including critical care time)  Medications Ordered in ED Medications  HYDROmorphone (DILAUDID) injection 0.5 mg (0.5 mg Intravenous Given 05/07/16 1401)  sodium chloride 0.9 % bolus 1,000 mL (0 mLs Intravenous Stopped 05/07/16 1448)     Initial Impression / Assessment and Plan / ED Course  I have reviewed the triage vital signs and the nursing notes.  Pertinent labs & imaging results that were available during my care of the patient were reviewed by me and considered in my medical decision making (see chart for details).  Clinical Course  Comment By Time  The patient is a 45 year old female, she does have a history of diarrhea that started yesterday with abdominal pain on the left and suprapubic area. On exam she has a very soft abdomen with slight increased bowel sounds and mild tenderness in that area. There is no guarding, no masses, no peritoneal signs, she otherwise appears well. She has normal electrolytes, normal urinalysis, no leukocytosis and no anemia. There is no history of inflammatory bowel disease in the family, this is likely an infectious colitis, Cipro, Flagyl, IV fluids, CT scan is not needed. Noemi Chapel, MD 08/04 1351  1255: Patient seen and examined by provider. Fluids and pain medicine ordered.  1400: Patient re assessed and feels better after pain medication and fluids. Explained discharge plan with patient and prescribed abx.  MDM: Labs findings are normal including, electrolytes, UA, lipase, no anemia or leukocytosis. Low suspicion for cholecystitis. No concern for acute abdomen. PE without guarding, masses, or rebound. CT not indicated. No family history of IBD. Likely infectious colitis. Encouraged patient to drink po fluids. Discharge with cipro and flagyl. Patient verbalized understanding of plan of care. Discussed plan of care with Dr. Sabra Heck.  Final  Clinical Impressions(s) / ED Diagnoses   Final diagnoses:  Diarrhea, unspecified type  Colitis  Lower abdominal pain    New Prescriptions Discharge Medication List as of 05/07/2016  2:35 PM    START taking these medications   Details  ciprofloxacin (CIPRO) 500 MG tablet Take 1 tablet (500 mg total) by mouth 2 (two) times daily., Starting Fri 05/07/2016, Print    metroNIDAZOLE (FLAGYL) 500 MG  tablet Take 1 tablet (500 mg total) by mouth 2 (two) times daily with a meal. DO NOT CONSUME ALCOHOL WHILE TAKING THIS MEDICATION., Starting Fri 05/07/2016, Print         Doristine Devoid, PA-C 05/07/16 1641    Noemi Chapel, MD 05/08/16 1334    Doristine Devoid, PA-C 07/15/16 2051    Noemi Chapel, MD 07/17/16 [phone]

## 2016-05-07 NOTE — ED Notes (Signed)
Attempted to get blood. Unable to get blood due to pt jumping severely when being stuck.

## 2016-05-07 NOTE — ED Notes (Signed)
Pt ambulatory to restroom

## 2016-05-07 NOTE — Discharge Instructions (Signed)
Drink plenty of fluids. Take abx as prescribed. If symptoms worsen return to ED.

## 2016-05-07 NOTE — ED Triage Notes (Signed)
Pt sts abd pain, diarrhea and nausea starting yesterday morning

## 2016-05-17 ENCOUNTER — Encounter (HOSPITAL_COMMUNITY): Payer: Self-pay | Admitting: *Deleted

## 2016-05-17 DIAGNOSIS — Z79899 Other long term (current) drug therapy: Secondary | ICD-10-CM

## 2016-05-17 DIAGNOSIS — E876 Hypokalemia: Secondary | ICD-10-CM | POA: Diagnosis present

## 2016-05-17 DIAGNOSIS — E860 Dehydration: Secondary | ICD-10-CM | POA: Diagnosis present

## 2016-05-17 DIAGNOSIS — Z800 Family history of malignant neoplasm of digestive organs: Secondary | ICD-10-CM

## 2016-05-17 DIAGNOSIS — Z8249 Family history of ischemic heart disease and other diseases of the circulatory system: Secondary | ICD-10-CM

## 2016-05-17 DIAGNOSIS — Z8041 Family history of malignant neoplasm of ovary: Secondary | ICD-10-CM

## 2016-05-17 DIAGNOSIS — K219 Gastro-esophageal reflux disease without esophagitis: Secondary | ICD-10-CM | POA: Diagnosis present

## 2016-05-17 DIAGNOSIS — I959 Hypotension, unspecified: Secondary | ICD-10-CM | POA: Diagnosis present

## 2016-05-17 DIAGNOSIS — A047 Enterocolitis due to Clostridium difficile: Principal | ICD-10-CM | POA: Diagnosis present

## 2016-05-17 DIAGNOSIS — Z881 Allergy status to other antibiotic agents status: Secondary | ICD-10-CM

## 2016-05-17 DIAGNOSIS — F17200 Nicotine dependence, unspecified, uncomplicated: Secondary | ICD-10-CM | POA: Diagnosis present

## 2016-05-17 LAB — CBC
HCT: 45.3 % (ref 36.0–46.0)
Hemoglobin: 15.4 g/dL — ABNORMAL HIGH (ref 12.0–15.0)
MCH: 32.8 pg (ref 26.0–34.0)
MCHC: 34 g/dL (ref 30.0–36.0)
MCV: 96.6 fL (ref 78.0–100.0)
PLATELETS: 225 10*3/uL (ref 150–400)
RBC: 4.69 MIL/uL (ref 3.87–5.11)
RDW: 14 % (ref 11.5–15.5)
WBC: 8.8 10*3/uL (ref 4.0–10.5)

## 2016-05-17 MED ORDER — ONDANSETRON 4 MG PO TBDP
4.0000 mg | ORAL_TABLET | Freq: Once | ORAL | Status: AC | PRN
  Administered 2016-05-17: 4 mg via ORAL

## 2016-05-17 MED ORDER — ONDANSETRON 4 MG PO TBDP
ORAL_TABLET | ORAL | Status: AC
  Filled 2016-05-17: qty 1

## 2016-05-17 NOTE — ED Triage Notes (Signed)
Pt c/o abdominal pain, n/v/d for two weeks.

## 2016-05-18 ENCOUNTER — Inpatient Hospital Stay (HOSPITAL_COMMUNITY)
Admission: EM | Admit: 2016-05-18 | Discharge: 2016-05-21 | DRG: 373 | Disposition: A | Discharge status: Home / Self Care | Payer: Self-pay | Attending: Internal Medicine | Admitting: Internal Medicine

## 2016-05-18 ENCOUNTER — Emergency Department (HOSPITAL_COMMUNITY): Payer: Self-pay

## 2016-05-18 ENCOUNTER — Encounter (HOSPITAL_COMMUNITY): Payer: Self-pay | Admitting: Radiology

## 2016-05-18 DIAGNOSIS — K529 Noninfective gastroenteritis and colitis, unspecified: Secondary | ICD-10-CM

## 2016-05-18 DIAGNOSIS — E876 Hypokalemia: Secondary | ICD-10-CM | POA: Diagnosis present

## 2016-05-18 DIAGNOSIS — IMO0001 Reserved for inherently not codable concepts without codable children: Secondary | ICD-10-CM | POA: Diagnosis present

## 2016-05-18 DIAGNOSIS — I959 Hypotension, unspecified: Secondary | ICD-10-CM | POA: Insufficient documentation

## 2016-05-18 DIAGNOSIS — A0472 Enterocolitis due to Clostridium difficile, not specified as recurrent: Secondary | ICD-10-CM | POA: Diagnosis present

## 2016-05-18 DIAGNOSIS — F17200 Nicotine dependence, unspecified, uncomplicated: Secondary | ICD-10-CM | POA: Diagnosis present

## 2016-05-18 DIAGNOSIS — E860 Dehydration: Secondary | ICD-10-CM | POA: Diagnosis present

## 2016-05-18 LAB — COMPREHENSIVE METABOLIC PANEL
ALBUMIN: 3.7 g/dL (ref 3.5–5.0)
ALK PHOS: 55 U/L (ref 38–126)
ALT: 23 U/L (ref 14–54)
ANION GAP: 7 (ref 5–15)
AST: 31 U/L (ref 15–41)
BILIRUBIN TOTAL: 0.3 mg/dL (ref 0.3–1.2)
BUN: 6 mg/dL (ref 6–20)
CALCIUM: 9.2 mg/dL (ref 8.9–10.3)
CHLORIDE: 110 mmol/L (ref 101–111)
CO2: 23 mmol/L (ref 22–32)
CREATININE: 0.8 mg/dL (ref 0.44–1.00)
Glucose, Bld: 133 mg/dL — ABNORMAL HIGH (ref 65–99)
Potassium: 3.3 mmol/L — ABNORMAL LOW (ref 3.5–5.1)
Sodium: 140 mmol/L (ref 135–145)
Total Protein: 6.6 g/dL (ref 6.5–8.1)

## 2016-05-18 LAB — URINALYSIS, ROUTINE W REFLEX MICROSCOPIC
Bilirubin Urine: NEGATIVE
Glucose, UA: NEGATIVE mg/dL
Ketones, ur: NEGATIVE mg/dL
LEUKOCYTES UA: NEGATIVE
NITRITE: NEGATIVE
PH: 6 (ref 5.0–8.0)
Protein, ur: NEGATIVE mg/dL
SPECIFIC GRAVITY, URINE: 1.013 (ref 1.005–1.030)

## 2016-05-18 LAB — URINE MICROSCOPIC-ADD ON

## 2016-05-18 LAB — CLOSTRIDIUM DIFFICILE BY PCR: CDIFFPCR: POSITIVE — AB

## 2016-05-18 LAB — MAGNESIUM: Magnesium: 1.6 mg/dL — ABNORMAL LOW (ref 1.7–2.4)

## 2016-05-18 LAB — C DIFFICILE QUICK SCREEN W PCR REFLEX
C DIFFICILE (CDIFF) TOXIN: NEGATIVE
C DIFFICLE (CDIFF) ANTIGEN: POSITIVE — AB

## 2016-05-18 LAB — LACTIC ACID, PLASMA: Lactic Acid, Venous: 0.5 mmol/L (ref 0.5–1.9)

## 2016-05-18 LAB — PHOSPHORUS: Phosphorus: 3.7 mg/dL (ref 2.5–4.6)

## 2016-05-18 LAB — LIPASE, BLOOD: Lipase: 32 U/L (ref 11–51)

## 2016-05-18 MED ORDER — VANCOMYCIN 50 MG/ML ORAL SOLUTION
125.0000 mg | Freq: Four times a day (QID) | ORAL | Status: DC
Start: 2016-05-18 — End: 2016-05-21
  Administered 2016-05-18 – 2016-05-21 (×12): 125 mg via ORAL
  Filled 2016-05-18 (×13): qty 2.5

## 2016-05-18 MED ORDER — ACETAMINOPHEN 325 MG PO TABS
650.0000 mg | ORAL_TABLET | Freq: Once | ORAL | Status: AC
  Administered 2016-05-18: 650 mg via ORAL
  Filled 2016-05-18: qty 2

## 2016-05-18 MED ORDER — PIPERACILLIN-TAZOBACTAM 3.375 G IVPB 30 MIN
3.3750 g | INTRAVENOUS | Status: AC
  Administered 2016-05-18: 3.375 g via INTRAVENOUS
  Filled 2016-05-18: qty 50

## 2016-05-18 MED ORDER — ACETAMINOPHEN 325 MG PO TABS: 650.0000 mg | ORAL_TABLET | Freq: Four times a day (QID) | ORAL | Status: DC | PRN

## 2016-05-18 MED ORDER — ONDANSETRON HCL 4 MG/2ML IJ SOLN
4.0000 mg | Freq: Once | INTRAMUSCULAR | Status: AC
  Administered 2016-05-18: 4 mg via INTRAVENOUS
  Filled 2016-05-18: qty 2

## 2016-05-18 MED ORDER — PIPERACILLIN-TAZOBACTAM 3.375 G IVPB
3.3750 g | Freq: Three times a day (TID) | INTRAVENOUS | Status: DC
  Administered 2016-05-18: 3.375 g via INTRAVENOUS
  Filled 2016-05-18 (×3): qty 50

## 2016-05-18 MED ORDER — SODIUM CHLORIDE 0.9 % IV SOLN: INTRAVENOUS | Status: DC

## 2016-05-18 MED ORDER — NICOTINE 21 MG/24HR TD PT24
21.0000 mg | MEDICATED_PATCH | Freq: Every day | TRANSDERMAL | Status: DC
  Filled 2016-05-18 (×4): qty 1

## 2016-05-18 MED ORDER — ENOXAPARIN SODIUM 40 MG/0.4ML ~~LOC~~ SOLN
40.0000 mg | SUBCUTANEOUS | Status: DC
  Administered 2016-05-18 – 2016-05-21 (×4): 40 mg via SUBCUTANEOUS
  Filled 2016-05-18 (×4): qty 0.4

## 2016-05-18 MED ORDER — HYDROMORPHONE HCL 1 MG/ML IJ SOLN
1.0000 mg | Freq: Once | INTRAMUSCULAR | Status: AC
  Administered 2016-05-18: 1 mg via INTRAVENOUS
  Filled 2016-05-18: qty 1

## 2016-05-18 MED ORDER — KETOROLAC TROMETHAMINE 15 MG/ML IJ SOLN
15.0000 mg | Freq: Three times a day (TID) | INTRAMUSCULAR | Status: DC | PRN
  Administered 2016-05-19: 15 mg via INTRAVENOUS
  Filled 2016-05-18: qty 1

## 2016-05-18 MED ORDER — MORPHINE SULFATE (PF) 2 MG/ML IV SOLN
1.0000 mg | INTRAVENOUS | Status: DC | PRN
  Administered 2016-05-18 – 2016-05-21 (×13): 2 mg via INTRAVENOUS
  Filled 2016-05-18 (×13): qty 1

## 2016-05-18 MED ORDER — ONDANSETRON HCL 4 MG/2ML IJ SOLN: 4.0000 mg | Freq: Four times a day (QID) | INTRAMUSCULAR | Status: DC | PRN

## 2016-05-18 MED ORDER — SODIUM CHLORIDE 0.9 % IV BOLUS (SEPSIS): 1000.0000 mL | INTRAVENOUS | Status: DC | PRN

## 2016-05-18 MED ORDER — SODIUM CHLORIDE 0.9% FLUSH
3.0000 mL | Freq: Two times a day (BID) | INTRAVENOUS | Status: DC
  Administered 2016-05-18 – 2016-05-21 (×6): 3 mL via INTRAVENOUS

## 2016-05-18 MED ORDER — MAGNESIUM SULFATE 2 GM/50ML IV SOLN
2.0000 g | Freq: Once | INTRAVENOUS | Status: AC
  Administered 2016-05-18: 2 g via INTRAVENOUS
  Filled 2016-05-18: qty 50

## 2016-05-18 MED ORDER — ACETAMINOPHEN 650 MG RE SUPP
650.0000 mg | Freq: Four times a day (QID) | RECTAL | Status: DC | PRN
Start: 2016-05-18 — End: 2016-05-21

## 2016-05-18 MED ORDER — HYDROMORPHONE HCL 1 MG/ML IJ SOLN
0.5000 mg | Freq: Once | INTRAMUSCULAR | Status: AC
  Administered 2016-05-18: 0.5 mg via INTRAVENOUS
  Filled 2016-05-18: qty 1

## 2016-05-18 MED ORDER — SODIUM CHLORIDE 0.9 % IV BOLUS (SEPSIS)
1000.0000 mL | Freq: Once | INTRAVENOUS | Status: AC
  Administered 2016-05-18: 1000 mL via INTRAVENOUS

## 2016-05-18 MED ORDER — PROMETHAZINE HCL 25 MG/ML IJ SOLN
25.0000 mg | Freq: Four times a day (QID) | INTRAMUSCULAR | Status: DC | PRN
  Administered 2016-05-20: 25 mg via INTRAVENOUS
  Filled 2016-05-18 (×2): qty 1

## 2016-05-18 MED ORDER — IOPAMIDOL (ISOVUE-300) INJECTION 61%
INTRAVENOUS | Status: AC
  Filled 2016-05-18: qty 100

## 2016-05-18 MED ORDER — METRONIDAZOLE 500 MG PO TABS
500.0000 mg | ORAL_TABLET | Freq: Four times a day (QID) | ORAL | Status: DC
  Administered 2016-05-18 – 2016-05-19 (×3): 500 mg via ORAL
  Filled 2016-05-18 (×3): qty 1

## 2016-05-18 MED ORDER — POTASSIUM CHLORIDE IN NACL 20-0.9 MEQ/L-% IV SOLN
INTRAVENOUS | Status: DC
  Administered 2016-05-18 – 2016-05-19 (×3): via INTRAVENOUS
  Filled 2016-05-18 (×4): qty 1000

## 2016-05-18 NOTE — Progress Notes (Addendum)
Initial quick C. difficile PCR demonstrated only antigen positive with negative C. difficile toxin but TOXIGENIC C. difficile was positive so she will formally be treated as C. difficile colitis. We will continue oral vancomycin and add oral Flagyl and discontinue Zosyn-admission status changed to INPATIENT since confirmed C Diff. Will also check HIV.  Erin Hearing, ANP

## 2016-05-18 NOTE — Progress Notes (Addendum)
SBP still dipping below 90 so will give an additional 1000 cc of NS. Mg 1.6 so give 2 gms IV Mag and repeat level in am. Lactic acid normal (0.5)  Erin Hearing, ANP

## 2016-05-18 NOTE — H&P (Signed)
History and Physical    April Hudson [id number] DOB: 29-Apr-1971 DOA: 05/18/2016   PCP: Minerva Ends, MD   Patient coming from/Resides with: Private residence/'s with husband  Chief Complaint: Ongoing abdominal pain with watery diarrhea  HPI: April Hudson is a 45 y.o. female with medical history significant for ongoing tobacco abuse, carpal tunnel syndrome and prior tenosynovitis who was initially evaluated on the ER on 8/4 with similar symptoms. At that time her labs were normal except for mild hemoconcentration from diarrhea related dehydration. Was felt she had infectious colitis and she was discharged with 7 days of Cipro and Flagyl. Since discharge from ER patient has completed all her medications and has not had any improvement in her symptoms and has actually had increasing diarrhea and worsening abdominal pain. She has had mild nausea and vomiting. She does continue to have fevers. She's not noticed any blood in the stool but it remains very watery mixed with black, brown, or green colors. She reports the abdominal pain is in the middle lower abdomen.  ED Course:  Vital signs: Temperature 101.2  Oral-101/65, 112, 20-room air 96% CT abdomen and pelvis with contrast: He is colonic wall thickening and edema likely representing infectious or pseudomembranous colitis. There are also prominent lymph nodes in infiltration in the root of the mesentery likely inflammatory in suggestive of mesenteritis. There is no obvious abscess or obstructive process Lab data: Sodium 140, potassium 3.3, chloride 110, BUN 6, creatinine 0.8, glucose 133, LFTs are normal, WBC 8800 differential not obtained, hemoglobin 15.4, platelets 225,000, urinalysis with rare bacteria moderate hemoglobin but WBCs normal at 0-5 and there is no nitrite and no ascites. Medications and treatments: Zofran 4 mg disintegrating tablet 1, Dilaudid 0.5 mg IV 1, Zofran 4 mg IV 1, Tylenol 650 mg IV 1, normal saline bolus  1 L 1, Dilaudid 1 mg IV 1, Zosyn 3.375 g IV 1  Review of Systems:  In addition to the HPI above,  No Headache, changes with Vision or hearing, new focal weakness, tingling, numbness in any extremity, No problems swallowing food or Liquids, indigestion/reflux No Chest pain, Cough or Shortness of Breath, palpitations, orthopnea or DOE No melena or hematochezia, no dark tarry stools No dysuria, hematuria or flank pain No new skin rashes, lesions, masses or bruises, No new joints pains-aches No recent weight gain or loss No polyuria, polydypsia or polyphagia,   Past Medical History:  Diagnosis Date  . Allergy   . Anemia    since at least 2013  . Asthma    as a child  . GERD (gastroesophageal reflux disease) Dx 2015  . History of frequent urinary tract infections   . History of gastric ulcer    clinical diagnosis, no prior EGD  . Joint pain   . Migraines Dx 2013  . Smoker     Past Surgical History:  Procedure Laterality Date  . COLONOSCOPY    . KNEE SURGERY    . POLYPECTOMY    . VAGINAL HYSTERECTOMY  2006   for fibroids and heavy menstrual bleeding     Social History   Social History  . Marital status: Married    Spouse name: N/A  . Number of children: 2  . Years of education: N/A   Occupational History  . Not on file.   Social History Main Topics  . Smoking status: Current Every Day Smoker    Packs/day: 1.00    Years: 30.00    Types: Cigarettes  . Smokeless tobacco:  Never Used     Comment: info given 06-16-15  . Alcohol use No  . Drug use: No  . Sexual activity: Not on file   Other Topics Concern  . Not on file   Social History Narrative   Lives with husband in a one story home.     Has 6 children (2 biological).     Works at [employer].  Education: GED    Mobility: Without assistive devices Work history: Production designer, theatre/television/film at [employer]  . Sulfa Antibiotics Hives    Family History  Problem Relation Age of Onset    . Diabetes Mother   . Hypertension Mother   . Ovarian cancer Mother   . Stomach cancer Maternal Uncle   . Heart disease Sister   . Healthy Brother   . Asthma Son   . Asthma Daughter   . Colon cancer Maternal Uncle   . Colon polyps Neg Hx   . Esophageal cancer Neg Hx   . Rectal cancer Neg Hx    Family history reviewed and No history of ulcer colitis or Crohn's disease known to patient and her family  Prior to Admission medications   Medication Sig Start Date End Date Taking? Authorizing Provider  acetaminophen-codeine (TYLENOL #3) 300-30 MG tablet Take 1 tablet by mouth every 8 (eight) hours as needed. Patient not taking: Reported on 05/07/2016 01/27/16   Boykin Nearing, MD  benzonatate (TESSALON) 100 MG capsule Take 1 capsule (100 mg total) by mouth every 8 (eight) hours. Patient not taking: Reported on 05/07/2016 04/09/16   Recardo Evangelist, PA-C  ciprofloxacin (CIPRO) 500 MG tablet Take 1 tablet (500 mg total) by mouth 2 (two) times daily. Patient not taking: Reported on 05/18/2016 05/07/16   Doristine Devoid, PA-C  docusate sodium (COLACE) 100 MG capsule Take 1 capsule (100 mg total) by mouth 2 (two) times daily. Patient not taking: Reported on 05/07/2016 10/23/15   Boykin Nearing, MD  lidocaine (XYLOCAINE) 2 % solution Use as directed 20 mLs in the mouth or throat as needed for mouth pain. Patient not taking: Reported on 05/07/2016 04/09/16   Recardo Evangelist, PA-C  metroNIDAZOLE (FLAGYL) 500 MG tablet Take 1 tablet (500 mg total) by mouth 2 (two) times daily with a meal. DO NOT CONSUME ALCOHOL WHILE TAKING THIS MEDICATION. Patient not taking: Reported on 05/18/2016 05/07/16   Doristine Devoid, PA-C  ondansetron (ZOFRAN) 4 MG tablet Take 1 tablet (4 mg total) by mouth every 8 (eight) hours as needed for nausea or vomiting. Patient not taking: Reported on 05/07/2016 02/13/16   Bjorn Pippin, PA-C  polyethylene glycol powder (GLYCOLAX/MIRALAX) powder Take 17 g by mouth daily. Patient not  taking: Reported on 05/07/2016 10/23/15   Boykin Nearing, MD    Physical Exam: Vitals:   05/18/16 0515 05/18/16 0701 05/18/16 0728 05/18/16 0729  BP: 96/55 (!) 81/60 (!) 86/62 (!) 86/62  Pulse: 91 87 81 77  Resp:    12  Temp:      TempSrc:      SpO2: 93% 96% 97% 94%  Weight:      Height:          Constitutional: NAD, calm, comfortable reporting thirst Eyes: PERRL, lids and conjunctivae normal ENMT: Mucous membranes are dry. Posterior pharynx clear of any exudate or lesions.Normal dentition.  Neck: normal, supple, no masses, no thyromegaly Respiratory: clear to auscultation bilaterally, no wheezing, no crackles. Normal respiratory effort. No accessory muscle use.  Cardiovascular:  Regular rate and rhythm, no murmurs / rubs / gallops. No extremity edema. 2+ pedal pulses. No carotid bruits. Systolic blood pressure has dipped as low as 70 after given IV narcotics Abdomen: Diffusely tender with more significant reported pain with guarding without rebounding just over the suprapubic region and mid abdomen, no masses palpated. No hepatosplenomegaly. Bowel sounds positive.  Musculoskeletal: no clubbing / cyanosis. No joint deformity upper and lower extremities. Good ROM, no contractures. Normal muscle tone.  Skin: no rashes, lesions, ulcers. No induration Neurologic: CN 2-12 grossly intact. Sensation intact, DTR normal. Strength 5/5 x all 4 extremities.  Psychiatric: Normal judgment and insight. Alert and oriented x 3. Normal mood.    Labs on Admission: I have personally reviewed following labs and imaging studies  CBC:  Recent Labs Lab 05/17/16 2328  WBC 8.8  HGB 15.4*  HCT 45.3  MCV 96.6  PLT [id number]   Basic Metabolic Panel:  Recent Labs Lab 05/17/16 2328  NA 140  K 3.3*  CL 110  CO2 23  GLUCOSE 133*  BUN 6  CREATININE 0.80  CALCIUM 9.2   GFR: Estimated Creatinine Clearance: 81.8 mL/min (by C-G formula based on SCr of 0.8 mg/dL). Liver Function Tests:  Recent  Labs Lab 05/17/16 2328  AST 31  ALT 23  ALKPHOS 55  BILITOT 0.3  PROT 6.6  ALBUMIN 3.7    Recent Labs Lab 05/17/16 2328  LIPASE 32   No results for input(s): AMMONIA in the last 168 hours. Coagulation Profile: No results for input(s): INR, PROTIME in the last 168 hours. Cardiac Enzymes: No results for input(s): CKTOTAL, CKMB, CKMBINDEX, TROPONINI in the last 168 hours. BNP (last 3 results) No results for input(s): PROBNP in the last 8760 hours. HbA1C: No results for input(s): HGBA1C in the last 72 hours. CBG: No results for input(s): GLUCAP in the last 168 hours. Lipid Profile: No results for input(s): CHOL, HDL, LDLCALC, TRIG, CHOLHDL, LDLDIRECT in the last 72 hours. Thyroid Function Tests: No results for input(s): TSH, T4TOTAL, FREET4, T3FREE, THYROIDAB in the last 72 hours. Anemia Panel: No results for input(s): VITAMINB12, FOLATE, FERRITIN, TIBC, IRON, RETICCTPCT in the last 72 hours. Urine analysis:    Component Value Date/Time   COLORURINE YELLOW 05/18/2016 0403   APPEARANCEUR CLEAR 05/18/2016 0403   LABSPEC 1.013 05/18/2016 0403   PHURINE 6.0 05/18/2016 0403   GLUCOSEU NEGATIVE 05/18/2016 0403   HGBUR MODERATE (A) 05/18/2016 0403   BILIRUBINUR NEGATIVE 05/18/2016 0403   BILIRUBINUR negative 04/14/2015 1205   KETONESUR NEGATIVE 05/18/2016 0403   PROTEINUR NEGATIVE 05/18/2016 0403   UROBILINOGEN 0.2 04/14/2015 1205   NITRITE NEGATIVE 05/18/2016 0403   LEUKOCYTESUR NEGATIVE 05/18/2016 0403   Sepsis Labs: @LABRCNTIP (procalcitonin:4,lacticidven:4) )No results found for this or any previous visit (from the past 240 hour(s)).   Radiological Exams on Admission: Ct Abdomen Pelvis W Contrast  Result Date: 05/18/2016 CLINICAL DATA:  Central abdominal pain with nausea, vomiting, and diarrhea for 2 weeks. Recent treatment for colitis. EXAM: CT ABDOMEN AND PELVIS WITH CONTRAST TECHNIQUE: Multidetector CT imaging of the abdomen and pelvis was performed using the  standard protocol following bolus administration of intravenous contrast. CONTRAST:  100 mL Isovue-300 COMPARISON:  09/16/2015 FINDINGS: Atelectasis in the lung bases. The liver, spleen, gallbladder, pancreas, adrenal glands, kidneys, abdominal aorta, and retroperitoneal lymph nodes are unremarkable. Mildly enlarged lymph nodes and hazy infiltration of the root of the mesenteric likely representing mesenteritis. Stomach, small bowel, and colon are not abnormally distended. Colon is decompressed but there  appears to be diffuse wall thickening and edema of the colon. This is likely to indicate colitis, possibly infectious or pseudomembranous. No free air or free fluid in the abdomen. Pelvis: Bladder wall is not thickened. Uterus is surgically absent. No pelvic mass or lymphadenopathy. No free or loculated pelvic fluid collections. Appendix is normal. No destructive bone lesions. IMPRESSION: Diffuse colonic wall thickening and edema likely representing infectious or pseudomembranous colitis. Prominent lymph nodes and infiltration in the root of the mesenteric likely inflammatory and suggesting mesenteritis. No abscess or obstruction. Electronically Signed   By: Lucienne Capers M.D.   On: 05/18/2016 05:37     Assessment/Plan Principal Problem:   SIRS due to infectious process without acute organ dysfunction/Infectious colitis and mesenteritis  -Patient presents with recalcitrant abdominal pain and diarrhea with fevers despite 7 days of oral Cipro and Flagyl-this is concerning for possible resistant C. difficile colitis -Given duration of symptoms and severity of dehydration although patient currently admitted as observation status I do not expect she will rebound quickly and will likely require at least a minimum of 3-4 days in the hospital therefore may need to upgrade to inpatient status after the first 24 hours of observation -Empiric Zosyn to cover infectious colitis not related to C. Difficile -PO  vancomycin empiric clear to cover possible C. Difficile -Chek C. difficile PCR panel -Enteric precautions -Obtain blood cultures -Check lactic acid -Aggressive volume resuscitation with 1 L now given systolic blood pressure in the 80s and continue IV fluids at 125 per hour -Symptom management with Zofran and Phenergan for nausea -Treat moderate pain with IV Toradol but monitor for any evidence of blood in stool noting has significant colitis and Toradol could cause bleeding to occur-if any lower GI blood noted discontinue Toradol -IV morphine for more severe pain until clinically stable to advance diet pass clears  Active Problems:   Dehydration, severe -Volume resuscitation as above -Current renal function/GFR within normal limits    Acute hypokalemia -Potassium and maintenance fluid -Check magnesium and phosphorus    Smoker  -Nicotine patch      DVT prophylaxis: Lovenox Code Status: Full Family Communication: No other family at bedside at time of admission Disposition Plan: Anticipate will return to previous home environment and employment once medically stable Consults called: None  Admission status: Observation/telemetry with likely upgrade to inpatient status within the next 24 hours    April Hudson L. ANP-BC [facility] Pager [phone]   If 7PM-7AM, please contact night-coverage [email or web address] Password [facility]  05/18/2016, 7:57 AM

## 2016-05-18 NOTE — ED Notes (Signed)
Spoke with Dr. Marily Memos (admitting MD) who reports despite pt's episode of hypotension pt is stable to go to med-surg unit. Normotensive for this pt is generally around 90 systolic. Pt remains a&o X4. Last BP 92/55.

## 2016-05-18 NOTE — Progress Notes (Signed)
Called report to accepting RN on [facility].

## 2016-05-18 NOTE — Care Management Note (Signed)
Case Management Note  Patient Details  Name: April Hudson MRN: [id number] Date of Birth: 03-10-1971  Subjective/Objective:                 Admitted for colitis after home course of cipro and flagyl failed. Patient follows at [facility] under Dr Adrian [place].   Action/Plan:   Expected Discharge Date:                  Expected Discharge Plan:  Home/Self Care  In-House Referral:     Discharge planning Services  CM Consult  Post Acute Care Choice:  NA Choice offered to:  NA  DME Arranged:  N/A DME Agency:  NA  HH Arranged:  NA HH Agency:  NA  Status of Service:  Completed, signed off  If discussed at [place] of Stay Meetings, dates discussed:    Additional Comments:  Carles Collet, RN 05/18/2016, 2:02 PM

## 2016-05-18 NOTE — ED Notes (Signed)
NP at bedside.

## 2016-05-18 NOTE — ED Notes (Signed)
Gave Pt water per [employer]

## 2016-05-18 NOTE — ED Provider Notes (Signed)
[facility] DEPT Provider Note   CSN: [id number] Arrival date & time: 05/17/16  2301     History   Chief Complaint Chief Complaint  Patient presents with  . Abdominal Pain  . Nausea  . Emesis  . Diarrhea    [name] is a 45 y.o. female.  Patient with history of colon polyps, recently seen on August 3 and diagnosed with presumptive infectious colitis, discharged home on Cipro and Flagyl which the patient has taken -- presents with continued symptoms and worsening pain. Abdominal pain is periumbilical and does not radiate. Patient now has a fever with her symptoms. She continues to have nonbloody diarrhea multiple times daily. She has had episodes of vomiting as well. No chest pain or shortness of breath. No urinary symptoms. No other treatments prior to arrival. Onset of symptoms acute. Course is gradually worsening. Nothing makes symptoms better or worse.      Past Medical History:  Diagnosis Date  . Allergy   . Anemia    since at least 2013  . Asthma    as a child  . GERD (gastroesophageal reflux disease) Dx 2015  . History of frequent urinary tract infections   . History of gastric ulcer    clinical diagnosis, no prior EGD  . Joint pain   . Migraines Dx 2013  . Smoker     Patient Active Problem List   Diagnosis Date Noted  . Gonorrhea in female 01/28/2016  . Trichomonal vaginitis 01/28/2016  . Constipation 10/30/2015  . Anal fissure 10/23/2015  . Epidermoid cyst 09/02/2015  . 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 Tinea pedis 05/26/2015  . Pain of left thumb 05/26/2015  . 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 De Quervain's tenosynovitis, left 04/14/2015  . Neck pain on right side 01/13/2015  . Carpal tunnel syndrome 12/30/2014  . Smoker 07/03/2014  . Family history of stomach cancer 07/03/2014    Past Surgical History:  Procedure Laterality Date  . COLONOSCOPY    . KNEE SURGERY    . POLYPECTOMY    . VAGINAL  HYSTERECTOMY  2006   for fibroids and heavy menstrual bleeding     OB History    No data available       Home Medications    Prior to Admission medications   Medication Sig Start Date End Date Taking? Authorizing Provider  acetaminophen-codeine (TYLENOL #3) 300-30 MG tablet Take 1 tablet by mouth every 8 (eight) hours as needed. Patient not taking: Reported on 05/07/2016 01/27/16   Boykin Nearing, MD  benzonatate (TESSALON) 100 MG capsule Take 1 capsule (100 mg total) by mouth every 8 (eight) hours. Patient not taking: Reported on 05/07/2016 04/09/16   Recardo Evangelist, PA-C  ciprofloxacin (CIPRO) 500 MG tablet Take 1 tablet (500 mg total) by mouth 2 (two) times daily. 05/07/16   Doristine Devoid, PA-C  docusate sodium (COLACE) 100 MG capsule Take 1 capsule (100 mg total) by mouth 2 (two) times daily. Patient not taking: Reported on 05/07/2016 10/23/15   Boykin Nearing, MD  ibuprofen (ADVIL,MOTRIN) 200 MG tablet Take 400 mg by mouth every 6 (six) hours as needed for headache or moderate pain.     Historical Provider, MD  lidocaine (XYLOCAINE) 2 % solution Use as directed 20 mLs in the mouth or throat as needed for mouth pain. Patient not taking: Reported on 05/07/2016 04/09/16   Recardo Evangelist, PA-C  metroNIDAZOLE (FLAGYL) 500 MG tablet Take 1 tablet (500 mg  total) by mouth 2 (two) times daily with a meal. DO NOT CONSUME ALCOHOL WHILE TAKING THIS MEDICATION. 05/07/16   Doristine Devoid, PA-C  ondansetron (ZOFRAN) 4 MG tablet Take 1 tablet (4 mg total) by mouth every 8 (eight) hours as needed for nausea or vomiting. Patient not taking: Reported on 05/07/2016 02/13/16   Bjorn Pippin, PA-C  polyethylene glycol powder (GLYCOLAX/MIRALAX) powder Take 17 g by mouth daily. Patient not taking: Reported on 05/07/2016 10/23/15   Boykin Nearing, MD    Family History Family History  Problem Relation Age of Onset  . Diabetes Mother   . Hypertension Mother   . Ovarian cancer Mother   . Stomach cancer  Maternal Uncle   . Heart disease Sister   . Healthy Brother   . Asthma Son   . Asthma Daughter   . Colon cancer Maternal Uncle   . Colon polyps Neg Hx   . Esophageal cancer Neg Hx   . Rectal cancer Neg Hx     Social History Social History  Substance Use Topics  . Smoking status: Current Every Day Smoker    Packs/day: 1.00    Years: 30.00    Types: Cigarettes  . Smokeless tobacco: Never Used     Comment: info given 06-16-15  . Alcohol use No     Allergies   Sulfa antibiotics   Review of Systems Review of Systems  Constitutional: Negative for fever.  HENT: Negative for rhinorrhea and sore throat.   Eyes: Negative for redness.  Respiratory: Negative for cough.   Cardiovascular: Negative for chest pain.  Gastrointestinal: Positive for abdominal pain, diarrhea, nausea and vomiting. Negative for blood in stool and constipation.  Genitourinary: Negative for dysuria.  Musculoskeletal: Negative for myalgias.  Skin: Negative for rash.  Neurological: Negative for headaches.    Physical Exam Updated Vital Signs BP 101/65 (BP Location: Left Arm)   Pulse 112   Temp 101.2 F (38.4 C) (Oral)   Resp 20   Ht 5\' 1"  (1.549 m)   Wt 75.8 kg   SpO2 96%   BMI 31.55 kg/m   Physical Exam  Constitutional: She appears well-developed and well-nourished.  HENT:  Head: Normocephalic and atraumatic.  Mouth/Throat: Oropharynx is clear and moist.  Eyes: Conjunctivae are normal. Right eye exhibits no discharge. Left eye exhibits no discharge.  Neck: Normal range of motion. Neck supple.  Cardiovascular: Normal rate, regular rhythm and normal heart sounds.   Pulmonary/Chest: Effort normal and breath sounds normal.  Abdominal: Soft. She exhibits no distension and no mass. There is tenderness (Periumbilical, moderate). There is no rebound and no guarding.  Musculoskeletal: She exhibits no edema.  Neurological: She is alert.  Skin: Skin is warm and dry.  Psychiatric: She has a normal mood  and affect.  Nursing note and vitals reviewed.    ED Treatments / Results  Labs (all labs ordered are listed, but only abnormal results are displayed) Labs Reviewed  COMPREHENSIVE METABOLIC PANEL - Abnormal; Notable for the following:       Result Value   Potassium 3.3 (*)    Glucose, Bld 133 (*)    All other components within normal limits  CBC - Abnormal; Notable for the following:    Hemoglobin 15.4 (*)    All other components within normal limits  URINALYSIS, ROUTINE W REFLEX MICROSCOPIC (NOT AT [facility]) - Abnormal; Notable for the following:    Hgb urine dipstick MODERATE (*)    All other components within normal limits  URINE MICROSCOPIC-ADD ON - Abnormal; Notable for the following:    Squamous Epithelial / LPF 0-5 (*)    Bacteria, UA RARE (*)    All other components within normal limits  GASTROINTESTINAL PANEL BY PCR, STOOL (REPLACES STOOL CULTURE)  LIPASE, BLOOD    EKG  EKG Interpretation None       Radiology Ct Abdomen Pelvis W Contrast  Result Date: 05/18/2016 CLINICAL DATA:  Central abdominal pain with nausea, vomiting, and diarrhea for 2 weeks. Recent treatment for colitis. EXAM: CT ABDOMEN AND PELVIS WITH CONTRAST TECHNIQUE: Multidetector CT imaging of the abdomen and pelvis was performed using the standard protocol following bolus administration of intravenous contrast. CONTRAST:  100 mL Isovue-300 COMPARISON:  09/16/2015 FINDINGS: Atelectasis in the lung bases. The liver, spleen, gallbladder, pancreas, adrenal glands, kidneys, abdominal aorta, and retroperitoneal lymph nodes are unremarkable. Mildly enlarged lymph nodes and hazy infiltration of the root of the mesenteric likely representing mesenteritis. Stomach, small bowel, and colon are not abnormally distended. Colon is decompressed but there appears to be diffuse wall thickening and edema of the colon. This is likely to indicate colitis, possibly infectious or pseudomembranous. No free air or free fluid in the  abdomen. Pelvis: Bladder wall is not thickened. Uterus is surgically absent. No pelvic mass or lymphadenopathy. No free or loculated pelvic fluid collections. Appendix is normal. No destructive bone lesions. IMPRESSION: Diffuse colonic wall thickening and edema likely representing infectious or pseudomembranous colitis. Prominent lymph nodes and infiltration in the root of the mesenteric likely inflammatory and suggesting mesenteritis. No abscess or obstruction. Electronically Signed   By: Lucienne Capers M.D.   On: 05/18/2016 05:37    Procedures Procedures (including critical care time)  Medications Ordered in ED Medications  ondansetron (ZOFRAN-ODT) 4 MG disintegrating tablet (not administered)  HYDROmorphone (DILAUDID) injection 0.5 mg (not administered)  ondansetron (ZOFRAN) injection 4 mg (not administered)  acetaminophen (TYLENOL) tablet 650 mg (not administered)  ondansetron (ZOFRAN-ODT) disintegrating tablet 4 mg (4 mg Oral Given 05/17/16 2333)     Initial Impression / Assessment and Plan / ED Course  I have reviewed the triage vital signs and the nursing notes.  Pertinent labs & imaging results that were available during my care of the patient were reviewed by me and considered in my medical decision making (see chart for details).  Clinical Course  Comment By Time  Patient seen and examined. CT abdomen and pelvis ordered given persistent and worsening abdominal pain and diarrhea, now with fever. No peritoneal signs. Fluid bolus ordered. White count is normal. Carlisle Cater, PA-C 08/15 [phone]  CT findings discussed with Dr. Roxanne Mins. Will admit. Zosyn ordered. Discussed case with Dr. Loleta Books who will see.  Carlisle Cater, [place] 08/15 [id number]     Final Clinical Impressions(s) / ED Diagnoses   Final diagnoses:  Colitis   Admit, persistent symptoms despite appropriate outpatient treatment.   New Prescriptions New Prescriptions   No medications on file     Carlisle Cater,  PA-C [id number] [phone]    Delora Fuel, [place] [id number]

## 2016-05-18 NOTE — Progress Notes (Signed)
Pharmacy Antibiotic Note  Cinda Witty is a 45 y.o. female admitted on 05/18/2016 with intra-abd infection.  Pharmacy has been consulted for Zosyn dosing.  Plan: Zosyn 3.375gm IV now over 30 min then 3.375gm IV q8h - subsequent doses over 4 hours Will f/u micro data, renal function, and pt's clinical condition   Height: 5\' 1"  (154.9 cm) Weight: 167 lb (75.8 kg) IBW/kg (Calculated) : 47.8  Temp (24hrs), Avg:101.2 F (38.4 C), Min:101.2 F (38.4 C), Max:101.2 F (38.4 C)   Recent Labs Lab 05/17/16 2328  WBC 8.8  CREATININE 0.80    Estimated Creatinine Clearance: 81.8 mL/min (by C-G formula based on SCr of 0.8 mg/dL).    Allergies  Allergen Reactions  . Sulfa Antibiotics Hives    Antimicrobials this admission: 8/15 Zosyn >>   Microbiology results: GI PCR:   Thank you for allowing pharmacy to be a part of this patient's care.  Sherlon Handing, PharmD, BCPS Clinical pharmacist, pager [phone] 05/18/2016 6:22 AM

## 2016-05-19 DIAGNOSIS — A419 Sepsis, unspecified organism: Secondary | ICD-10-CM

## 2016-05-19 DIAGNOSIS — E876 Hypokalemia: Secondary | ICD-10-CM

## 2016-05-19 DIAGNOSIS — E860 Dehydration: Secondary | ICD-10-CM

## 2016-05-19 DIAGNOSIS — A047 Enterocolitis due to Clostridium difficile: Principal | ICD-10-CM

## 2016-05-19 DIAGNOSIS — R6510 Systemic inflammatory response syndrome (SIRS) of non-infectious origin without acute organ dysfunction: Secondary | ICD-10-CM

## 2016-05-19 LAB — COMPREHENSIVE METABOLIC PANEL
ALBUMIN: 2.9 g/dL — AB (ref 3.5–5.0)
ALK PHOS: 38 U/L (ref 38–126)
ALT: 22 U/L (ref 14–54)
ANION GAP: 4 — AB (ref 5–15)
AST: 32 U/L (ref 15–41)
BILIRUBIN TOTAL: 0.3 mg/dL (ref 0.3–1.2)
BUN: <5 mg/dL — ABNORMAL LOW (ref 6–20)
CALCIUM: 8.4 mg/dL — AB (ref 8.9–10.3)
CO2: 21 mmol/L — ABNORMAL LOW (ref 22–32)
Chloride: 113 mmol/L — ABNORMAL HIGH (ref 101–111)
Creatinine, Ser: 0.61 mg/dL (ref 0.44–1.00)
GFR calc Af Amer: >60 mL/min (ref >60)
GFR calc non Af Amer: >60 mL/min (ref >60)
GLUCOSE: 113 mg/dL — AB (ref 65–99)
Potassium: 3.6 mmol/L (ref 3.5–5.1)
Sodium: 138 mmol/L (ref 135–145)
TOTAL PROTEIN: 5.4 g/dL — AB (ref 6.5–8.1)

## 2016-05-19 LAB — GASTROINTESTINAL PANEL BY PCR, STOOL (REPLACES STOOL CULTURE)
Adenovirus F40/41: NOT DETECTED
Astrovirus: NOT DETECTED
CYCLOSPORA CAYETANENSIS: NOT DETECTED
Campylobacter species: NOT DETECTED
Cryptosporidium: DETECTED — AB
E. COLI O157: NOT DETECTED
ENTAMOEBA HISTOLYTICA: NOT DETECTED
Enteroaggregative E coli (EAEC): NOT DETECTED
Enteropathogenic E coli (EPEC): NOT DETECTED
Enterotoxigenic E coli (ETEC): NOT DETECTED
Giardia lamblia: NOT DETECTED
NOROVIRUS GI/GII: NOT DETECTED
Plesimonas shigelloides: NOT DETECTED
ROTAVIRUS A: NOT DETECTED
SALMONELLA SPECIES: NOT DETECTED
SAPOVIRUS (I, II, IV, AND V): NOT DETECTED
SHIGA LIKE TOXIN PRODUCING E COLI (STEC): NOT DETECTED
SHIGELLA/ENTEROINVASIVE E COLI (EIEC): NOT DETECTED
VIBRIO CHOLERAE: NOT DETECTED
Vibrio species: NOT DETECTED
Yersinia enterocolitica: NOT DETECTED

## 2016-05-19 LAB — CBC
HCT: 39.6 % (ref 36.0–46.0)
HEMOGLOBIN: 13 g/dL (ref 12.0–15.0)
MCH: 31.9 pg (ref 26.0–34.0)
MCHC: 32.8 g/dL (ref 30.0–36.0)
MCV: 97.3 fL (ref 78.0–100.0)
Platelets: 185 10*3/uL (ref 150–400)
RBC: 4.07 MIL/uL (ref 3.87–5.11)
RDW: 14.5 % (ref 11.5–15.5)
WBC: 5.6 10*3/uL (ref 4.0–10.5)

## 2016-05-19 LAB — MAGNESIUM: MAGNESIUM: 2.1 mg/dL (ref 1.7–2.4)

## 2016-05-19 NOTE — Progress Notes (Signed)
PROGRESS NOTE    April Hudson  [id number] DOB: 11-26-1970 DOA: 05/18/2016 PCP: Minerva Ends, MD   Brief Narrative:  45 y/o female admitted to the medicine service on 06/18/2016 when she presented with complaints of abdominal pain associated with nonbloody liquid consistency stools. She had a recent emergency room visit on 05/07/2016 with similar symptoms at which time she was discharged on a one-week course of ciprofloxacin and Flagyl. Stools were positive for C. difficile and was started on oral vancomycin. CT scan of abdomen and pelvis with IV contrast revealing colonic wall thickening and edema consistent with pseudomembranous colitis.  Assessment & Plan:   Principal Problem:   SIRS due to infectious process without acute organ dysfunction (HCC) Active Problems:   Smoker   Clostridium difficile colitis   Dehydration, severe   Acute hypokalemia  1.  C. difficile colitis -Was recently seen in the emergency room discharged on 7 day course of ciprofloxacin along with Flagyl -Presented with abdominal pain associate with liquid consistency nonbloody diarrhea, CT scan of abdomen showing findings highly suggestive of pseudomembranous colitis. -Overnight stool for C. difficile came back positive -Plan to continue oral vancomycin -On my assessment on 05/19/2016 patient reporting feeling better with diarrhea slowing down -Continue supportive care  2.  Hypokalemia -Lab work showed potassium of 3.3, likely secondary to GI losses, potassium improved to 3.6 after replacement  3.  Dehydration -She appeared dehydrated on physical examination, hypotensive with blood pressure 92/55, responding to IV fluid resuscitation  DVT prophylaxis: Lovenox 40 mg subcutaneous daily Code Status: Full code Family Communication: Spoke to her son present at bedside Disposition Plan: Anticipate discharge next 48 hours if she continues to show improvement  Antimicrobials:  Oral  vancomycin  Subjective: Reporting slowing down of diarrhea, abdominal pain improved as well  Objective: Vitals:   05/18/16 0924 05/18/16 1501 05/18/16 2152 05/19/16 0701  BP: (!) 84/58 (!) 93/59 98/63 109/73  Pulse: 70 61 75 75  Resp:  16 18 18   Temp:  98.1 F (36.7 C) 98 F (36.7 C) 98.2 F (36.8 C)  TempSrc:   Oral Oral  SpO2:  92% 95% 96%  Weight:      Height:        Intake/Output Summary (Last 24 hours) at 05/19/16 1434 Last data filed at 05/19/16 1100  Gross per 24 hour  Intake          2987.09 ml  Output              300 ml  Net          2687.09 ml   Filed Weights   05/17/16 2323 05/18/16 0911  Weight: 75.8 kg (167 lb) 72.8 kg (160 lb 9.6 oz)    Examination:  General exam: Nontoxic appearing awake and alert auscultation. Respiratory effort normal. Cardiovascular system: S1 & S2 heard, RRR. No JVD, murmurs, rubs, gallops or clicks. No pedal edema. Gastrointestinal system there is mild generalized tenderness to palpation Central nervous system: Alert and oriented. No focal neurological deficits. Extremities: Symmetric 5 x 5 power. Skin: No rashes, lesions or ulcers Psychiatry: Judgement and insight appear normal. Mood & affect appropriate.     Data Reviewed: I have personally reviewed following labs and imaging studies  CBC:  Recent Labs Lab 05/17/16 2328 05/19/16 0935  WBC 8.8 5.6  HGB 15.4* 13.0  HCT 45.3 39.6  MCV 96.6 97.3  PLT 225 [id number]   Basic Metabolic Panel:  Recent Labs Lab 05/17/16 2328 05/18/16 0828 05/19/16  0935  NA 140  --  138  K 3.3*  --  3.6  CL 110  --  113*  CO2 23  --  21*  GLUCOSE 133*  --  113*  BUN 6  --  <5*  CREATININE 0.80  --  0.61  CALCIUM 9.2  --  8.4*  MG  --  1.6* 2.1  PHOS  --  3.7  --    GFR: Estimated Creatinine Clearance: 80.2 mL/min (by C-G formula based on SCr of 0.8 mg/dL). Liver Function Tests:  Recent Labs Lab 05/17/16 2328 05/19/16 0935  AST 31 32  ALT 23 22  ALKPHOS 55 38  BILITOT 0.3  0.3  PROT 6.6 5.4*  ALBUMIN 3.7 2.9*    Recent Labs Lab 05/17/16 2328  LIPASE 32   No results for input(s): AMMONIA in the last 168 hours. Coagulation Profile: No results for input(s): INR, PROTIME in the last 168 hours. Cardiac Enzymes: No results for input(s): CKTOTAL, CKMB, CKMBINDEX, TROPONINI in the last 168 hours. BNP (last 3 results) No results for input(s): PROBNP in the last 8760 hours. HbA1C: No results for input(s): HGBA1C in the last 72 hours. CBG: No results for input(s): GLUCAP in the last 168 hours. Lipid Profile: No results for input(s): CHOL, HDL, LDLCALC, TRIG, CHOLHDL, LDLDIRECT in the last 72 hours. Thyroid Function Tests: No results for input(s): TSH, T4TOTAL, FREET4, T3FREE, THYROIDAB in the last 72 hours. Anemia Panel: No results for input(s): VITAMINB12, FOLATE, FERRITIN, TIBC, IRON, RETICCTPCT in the last 72 hours. Sepsis Labs:  Recent Labs Lab 05/18/16 [id number]  LATICACIDVEN 0.5    Recent Results (from the past 240 hour(s))  Gastrointestinal Panel by PCR , Stool     Status: Abnormal   Collection Time: 05/18/16 12:42 PM  Result Value Ref Range Status   Campylobacter species NOT DETECTED NOT DETECTED Final   Plesimonas shigelloides NOT DETECTED NOT DETECTED Final   Salmonella species NOT DETECTED NOT DETECTED Final   Yersinia enterocolitica NOT DETECTED NOT DETECTED Final   Vibrio species NOT DETECTED NOT DETECTED Final   Vibrio cholerae NOT DETECTED NOT DETECTED Final   Enteroaggregative E coli (EAEC) NOT DETECTED NOT DETECTED Final   Enteropathogenic E coli (EPEC) NOT DETECTED NOT DETECTED Final   Enterotoxigenic E coli (ETEC) NOT DETECTED NOT DETECTED Final   Shiga like toxin producing E coli (STEC) NOT DETECTED NOT DETECTED Final   E. coli O157 NOT DETECTED NOT DETECTED Final   Shigella/Enteroinvasive E coli (EIEC) NOT DETECTED NOT DETECTED Final   Cryptosporidium DETECTED (A) NOT DETECTED Final   Cyclospora cayetanensis NOT DETECTED NOT  DETECTED Final   Entamoeba histolytica NOT DETECTED NOT DETECTED Final   Giardia lamblia NOT DETECTED NOT DETECTED Final   Adenovirus F40/41 NOT DETECTED NOT DETECTED Final   Astrovirus NOT DETECTED NOT DETECTED Final   Norovirus GI/GII NOT DETECTED NOT DETECTED Final   Rotavirus A NOT DETECTED NOT DETECTED Final   Sapovirus (I, II, IV, and V) NOT DETECTED NOT DETECTED Final  C difficile quick scan w PCR reflex     Status: Abnormal   Collection Time: 05/18/16 12:42 PM  Result Value Ref Range Status   C Diff antigen POSITIVE (A) NEGATIVE Final   C Diff toxin NEGATIVE NEGATIVE Final   C Diff interpretation Results are indeterminate. See PCR results.  Final  Clostridium Difficile by PCR     Status: Abnormal   Collection Time: 05/18/16 12:42 PM  Result Value Ref Range Status  Toxigenic C Difficile by pcr POSITIVE (A) NEGATIVE Final         Radiology Studies: Ct Abdomen Pelvis W Contrast  Result Date: 05/18/2016 CLINICAL DATA:  Central abdominal pain with nausea, vomiting, and diarrhea for 2 weeks. Recent treatment for colitis. EXAM: CT ABDOMEN AND PELVIS WITH CONTRAST TECHNIQUE: Multidetector CT imaging of the abdomen and pelvis was performed using the standard protocol following bolus administration of intravenous contrast. CONTRAST:  100 mL Isovue-300 COMPARISON:  09/16/2015 FINDINGS: Atelectasis in the lung bases. The liver, spleen, gallbladder, pancreas, adrenal glands, kidneys, abdominal aorta, and retroperitoneal lymph nodes are unremarkable. Mildly enlarged lymph nodes and hazy infiltration of the root of the mesenteric likely representing mesenteritis. Stomach, small bowel, and colon are not abnormally distended. Colon is decompressed but there appears to be diffuse wall thickening and edema of the colon. This is likely to indicate colitis, possibly infectious or pseudomembranous. No free air or free fluid in the abdomen. Pelvis: Bladder wall is not thickened. Uterus is surgically  absent. No pelvic mass or lymphadenopathy. No free or loculated pelvic fluid collections. Appendix is normal. No destructive bone lesions. IMPRESSION: Diffuse colonic wall thickening and edema likely representing infectious or pseudomembranous colitis. Prominent lymph nodes and infiltration in the root of the mesenteric likely inflammatory and suggesting mesenteritis. No abscess or obstruction. Electronically Signed   By: Lucienne Capers M.D.   On: 05/18/2016 05:37        Scheduled Meds: . enoxaparin (LOVENOX) injection  40 mg Subcutaneous Q24H  . nicotine  21 mg Transdermal Daily  . sodium chloride flush  3 mL Intravenous Q12H  . vancomycin  125 mg Oral Q6H   Continuous Infusions:    LOS: 1 day    Time spent: 30 min   Kelvin Cellar, MD [facility] Pager [phone]  If 7PM-7AM, please contact night-coverage [email or web address] Password [facility] 05/19/2016, 2:34 PM

## 2016-05-20 LAB — BASIC METABOLIC PANEL
ANION GAP: 8 (ref 5–15)
BUN: <5 mg/dL — ABNORMAL LOW (ref 6–20)
CALCIUM: 8.7 mg/dL — AB (ref 8.9–10.3)
CO2: 23 mmol/L (ref 22–32)
Chloride: 109 mmol/L (ref 101–111)
Creatinine, Ser: 0.67 mg/dL (ref 0.44–1.00)
GFR calc Af Amer: >60 mL/min (ref >60)
Glucose, Bld: 91 mg/dL (ref 65–99)
POTASSIUM: 4 mmol/L (ref 3.5–5.1)
SODIUM: 140 mmol/L (ref 135–145)

## 2016-05-20 LAB — CBC
HEMATOCRIT: 38 % (ref 36.0–46.0)
HEMOGLOBIN: 12.9 g/dL (ref 12.0–15.0)
MCH: 32.5 pg (ref 26.0–34.0)
MCHC: 33.9 g/dL (ref 30.0–36.0)
MCV: 95.7 fL (ref 78.0–100.0)
Platelets: 195 10*3/uL (ref 150–400)
RBC: 3.97 MIL/uL (ref 3.87–5.11)
RDW: 14.2 % (ref 11.5–15.5)
WBC: 6.3 10*3/uL (ref 4.0–10.5)

## 2016-05-20 LAB — HIV ANTIBODY (ROUTINE TESTING W REFLEX): HIV SCREEN 4TH GENERATION: NONREACTIVE

## 2016-05-20 NOTE — Progress Notes (Signed)
PROGRESS NOTE    April Hudson  [id number] DOB: 1970-10-12 DOA: 05/18/2016 PCP: Minerva Ends, MD   Brief Narrative:  45 y/o female admitted to the medicine service on 06/18/2016 when she presented with complaints of abdominal pain associated with nonbloody liquid consistency stools. She had a recent emergency room visit on 05/07/2016 with similar symptoms at which time she was discharged on a one-week course of ciprofloxacin and Flagyl. Stools were positive for C. difficile and was started on oral vancomycin. CT scan of abdomen and pelvis with IV contrast revealing colonic wall thickening and edema consistent with pseudomembranous colitis.  Assessment & Plan:   Principal Problem:   SIRS due to infectious process without acute organ dysfunction [facility]) Active Problems:   Smoker   Clostridium difficile colitis   Dehydration, severe   Acute hypokalemia  1.  C. difficile colitis -Was recently seen in the emergency room discharged on 7 day course of ciprofloxacin along with Flagyl -Presented with abdominal pain associate with liquid consistency nonbloody diarrhea, CT scan of abdomen showing findings highly suggestive of pseudomembranous colitis. -Overnight stool for C. difficile came back positive -Plan to continue oral vancomycin -On 05/20/2016 patient reporting ongoing diarrhea as well as some nausea. Lab work stable, white count within normal limits at 6300, she is afebrile, hemodynamically stable  2.  Hypokalemia -Lab work showed potassium of 3.3, likely secondary to GI losses, potassium improved to 3.6 after replacement  3.  Dehydration -She appeared dehydrated on physical examination, hypotensive with blood pressure 92/55, responding to IV fluid resuscitation  DVT prophylaxis: Lovenox 40 mg subcutaneous daily Code Status: Full code Family Communication: Spoke to her son present at bedside Disposition Plan: Anticipate discharge next 24-48 hours if she shows  improvement  Antimicrobials:  Oral vancomycin  Subjective: She complains of ongoing abdominal pain and nausea, still having some diarrhea  Objective: Vitals:   05/19/16 1546 05/19/16 2150 05/20/16 0511 05/20/16 1317  BP: 107/73 111/71 124/71 108/72  Pulse: 73 66 63 64  Resp: 18  18 19   Temp: 98.2 F (36.8 C) 98.5 F (36.9 C) 98.7 F (37.1 C) 98.3 F (36.8 C)  TempSrc: Oral Oral Oral Oral  SpO2: 98% 98% 95% 97%  Weight:      Height:        Intake/Output Summary (Last 24 hours) at 05/20/16 1705 Last data filed at 05/20/16 1218  Gross per 24 hour  Intake               60 ml  Output                0 ml  Net               60 ml   Filed Weights   05/17/16 2323 05/18/16 0911  Weight: 75.8 kg (167 lb) 72.8 kg (160 lb 9.6 oz)    Examination:  General exam: Nontoxic appearing awake and alert auscultation. Respiratory effort normal. Cardiovascular system: S1 & S2 heard, RRR. No JVD, murmurs, rubs, gallops or clicks. No pedal edema. Gastrointestinal system there is mild generalized tenderness to palpation, unchanged from yesterday's exam Central nervous system: Alert and oriented. No focal neurological deficits. Extremities: Symmetric 5 x 5 power. Skin: No rashes, lesions or ulcers Psychiatry: Judgement and insight appear normal. Mood & affect appropriate.     Data Reviewed: I have personally reviewed following labs and imaging studies  CBC:  Recent Labs Lab 05/17/16 2328 05/19/16 0935 05/20/16 0540  WBC 8.8 5.6 6.3  HGB 15.4* 13.0 12.9  HCT 45.3 39.6 38.0  MCV 96.6 97.3 95.7  PLT 225 185 0000000   Basic Metabolic Panel:  Recent Labs Lab 05/17/16 2328 05/18/16 0828 05/19/16 0935 05/20/16 0540  NA 140  --  138 140  K 3.3*  --  3.6 4.0  CL 110  --  113* 109  CO2 23  --  21* 23  GLUCOSE 133*  --  113* 91  BUN 6  --  <5* <5*  CREATININE 0.80  --  0.61 0.67  CALCIUM 9.2  --  8.4* 8.7*  MG  --  1.6* 2.1  --   PHOS  --  3.7  --   --    GFR: Estimated  Creatinine Clearance: 80.2 mL/min (by C-G formula based on SCr of 0.8 mg/dL). Liver Function Tests:  Recent Labs Lab 05/17/16 2328 05/19/16 0935  AST 31 32  ALT 23 22  ALKPHOS 55 38  BILITOT 0.3 0.3  PROT 6.6 5.4*  ALBUMIN 3.7 2.9*    Recent Labs Lab 05/17/16 2328  LIPASE 32   No results for input(s): AMMONIA in the last 168 hours. Coagulation Profile: No results for input(s): INR, PROTIME in the last 168 hours. Cardiac Enzymes: No results for input(s): CKTOTAL, CKMB, CKMBINDEX, TROPONINI in the last 168 hours. BNP (last 3 results) No results for input(s): PROBNP in the last 8760 hours. HbA1C: No results for input(s): HGBA1C in the last 72 hours. CBG: No results for input(s): GLUCAP in the last 168 hours. Lipid Profile: No results for input(s): CHOL, HDL, LDLCALC, TRIG, CHOLHDL, LDLDIRECT in the last 72 hours. Thyroid Function Tests: No results for input(s): TSH, T4TOTAL, FREET4, T3FREE, THYROIDAB in the last 72 hours. Anemia Panel: No results for input(s): VITAMINB12, FOLATE, FERRITIN, TIBC, IRON, RETICCTPCT in the last 72 hours. Sepsis Labs:  Recent Labs Lab 05/18/16 [id number]  LATICACIDVEN 0.5    Recent Results (from the past 240 hour(s))  Culture, blood (Routine X 2) w Reflex to ID Panel     Status: None (Preliminary result)   Collection Time: 05/18/16  8:15 AM  Result Value Ref Range Status   Specimen Description BLOOD LEFT ANTECUBITAL  Final   Special Requests BOTTLES DRAWN AEROBIC AND ANAEROBIC  10CC  Final   Culture NO GROWTH 2 DAYS  Final   Report Status PENDING  Incomplete  Culture, blood (Routine X 2) w Reflex to ID Panel     Status: None (Preliminary result)   Collection Time: 05/18/16  8:30 AM  Result Value Ref Range Status   Specimen Description BLOOD LEFT HAND  Final   Special Requests BOTTLES DRAWN AEROBIC AND ANAEROBIC  5CC  Final   Culture NO GROWTH 2 DAYS  Final   Report Status PENDING  Incomplete  Gastrointestinal Panel by PCR , Stool      Status: Abnormal   Collection Time: 05/18/16 12:42 PM  Result Value Ref Range Status   Campylobacter species NOT DETECTED NOT DETECTED Final   Plesimonas shigelloides NOT DETECTED NOT DETECTED Final   Salmonella species NOT DETECTED NOT DETECTED Final   Yersinia enterocolitica NOT DETECTED NOT DETECTED Final   Vibrio species NOT DETECTED NOT DETECTED Final   Vibrio cholerae NOT DETECTED NOT DETECTED Final   Enteroaggregative E coli (EAEC) NOT DETECTED NOT DETECTED Final   Enteropathogenic E coli (EPEC) NOT DETECTED NOT DETECTED Final   Enterotoxigenic E coli (ETEC) NOT DETECTED NOT DETECTED Final   Shiga like toxin producing E coli (STEC) NOT  DETECTED NOT DETECTED Final   E. coli O157 NOT DETECTED NOT DETECTED Final   Shigella/Enteroinvasive E coli (EIEC) NOT DETECTED NOT DETECTED Final   Cryptosporidium DETECTED (A) NOT DETECTED Final   Cyclospora cayetanensis NOT DETECTED NOT DETECTED Final   Entamoeba histolytica NOT DETECTED NOT DETECTED Final   Giardia lamblia NOT DETECTED NOT DETECTED Final   Adenovirus F40/41 NOT DETECTED NOT DETECTED Final   Astrovirus NOT DETECTED NOT DETECTED Final   Norovirus GI/GII NOT DETECTED NOT DETECTED Final   Rotavirus A NOT DETECTED NOT DETECTED Final   Sapovirus (I, II, IV, and V) NOT DETECTED NOT DETECTED Final  C difficile quick scan w PCR reflex     Status: Abnormal   Collection Time: 05/18/16 12:42 PM  Result Value Ref Range Status   C Diff antigen POSITIVE (A) NEGATIVE Final   C Diff toxin NEGATIVE NEGATIVE Final   C Diff interpretation Results are indeterminate. See PCR results.  Final  Clostridium Difficile by PCR     Status: Abnormal   Collection Time: 05/18/16 12:42 PM  Result Value Ref Range Status   Toxigenic C Difficile by pcr POSITIVE (A) NEGATIVE Final         Radiology Studies: No results found.      Scheduled Meds: . enoxaparin (LOVENOX) injection  40 mg Subcutaneous Q24H  . nicotine  21 mg Transdermal Daily  .  sodium chloride flush  3 mL Intravenous Q12H  . vancomycin  125 mg Oral Q6H   Continuous Infusions:    LOS: 2 days    Time spent: 15 min   Kelvin Cellar, MD [facility] Pager [phone]  If 7PM-7AM, please contact night-coverage [email or web address] Password [email or web address] 05/20/2016, 5:05 PM

## 2016-05-21 DIAGNOSIS — Z8619 Personal history of other infectious and parasitic diseases: Secondary | ICD-10-CM

## 2016-05-21 HISTORY — DX: Personal history of other infectious and parasitic diseases: Z86.19

## 2016-05-21 MED ORDER — VANCOMYCIN 50 MG/ML ORAL SOLUTION: 250.0000 mg | Freq: Four times a day (QID) | ORAL | 0 refills | Status: DC

## 2016-05-21 MED ORDER — ONDANSETRON HCL 4 MG PO TABS: 4.0000 mg | ORAL_TABLET | Freq: Three times a day (TID) | ORAL | 0 refills | Status: DC | PRN

## 2016-05-21 MED ORDER — OXYCODONE HCL 5 MG PO TABS: 5.0000 mg | ORAL_TABLET | ORAL | Status: DC | PRN

## 2016-05-21 MED ORDER — OXYCODONE HCL 5 MG PO TABS: 5.0000 mg | ORAL_TABLET | Freq: Three times a day (TID) | ORAL | 0 refills | Status: DC | PRN

## 2016-05-21 MED ORDER — VANCOMYCIN 50 MG/ML ORAL SOLUTION
250.0000 mg | Freq: Four times a day (QID) | ORAL | Status: DC
  Administered 2016-05-21: 250 mg via ORAL
  Filled 2016-05-21 (×2): qty 5

## 2016-05-21 NOTE — Discharge Summary (Signed)
Physician Discharge Summary  Nickisha Keelin [id number] DOB: 01-10-1971 DOA: 05/18/2016  PCP: Minerva Ends, MD  Admit date: 05/18/2016 Discharge date: 05/21/2016  Time spent: 35 minutes  Recommendations for Outpatient Follow-up:  1. Mrs Lave admitted for C diff colitis, discharged on 2 weeks of oral Vancomycin   Discharge Diagnoses:  Principal Problem:   SIRS due to infectious process without acute organ dysfunction [facility]) Active Problems:   Smoker   Clostridium difficile colitis   Dehydration, severe   Acute hypokalemia   Discharge Condition: Improved  Diet recommendation: Regular Diet  Filed Weights   05/17/16 2323 05/18/16 0911  Weight: 75.8 kg (167 lb) 72.8 kg (160 lb 9.6 oz)    History of present illness:  April Hudson is a 45 y.o. female with medical history significant for ongoing tobacco abuse, carpal tunnel syndrome and prior tenosynovitis who was initially evaluated on the ER on 8/4 with similar symptoms. At that time her labs were normal except for mild hemoconcentration from diarrhea related dehydration. Was felt she had infectious colitis and she was discharged with 7 days of Cipro and Flagyl. Since discharge from ER patient has completed all her medications and has not had any improvement in her symptoms and has actually had increasing diarrhea and worsening abdominal pain. She has had mild nausea and vomiting. She does continue to have fevers. She's not noticed any blood in the stool but it remains very watery mixed with black, brown, or green colors. She reports the abdominal pain is in the middle lower abdomen.   Hospital Course:  45 y/o female admitted to the medicine service on 06/18/2016 when she presented with complaints of abdominal pain associated with nonbloody liquid consistency stools. She had a recent emergency room visit on 05/07/2016 with similar symptoms at which time she was discharged on a one-week course of ciprofloxacin and Flagyl.  Stools were positive for C. difficile and was started on oral vancomycin. CT scan of abdomen and pelvis with IV contrast revealing colonic wall thickening and edema consistent with pseudomembranous colitis.  1.  C. difficile colitis -Was recently seen in the emergency room discharged on 7 day course of ciprofloxacin along with Flagyl -Presented with abdominal pain associate with liquid consistency nonbloody diarrhea, CT scan of abdomen showing findings highly suggestive of pseudomembranous colitis. -Overnight stool for C. difficile came back positive -On 05/20/2016 patient reporting ongoing diarrhea as well as some nausea. Lab work stable, white count within normal limits at 6300, she is afebrile, hemodynamically stable -On 05/20/2016 she reported feeling better with diarrheal symptoms improving. She was tolerating PO intake and felt she was ready to be discharged from the hospital. She was discharged on oral Vanc x 2 weeks.  2.  Hypokalemia -Lab work showed potassium of 3.3, likely secondary to GI losses, potassium improved to 4.0 after replacement   3.  Dehydration -She appeared dehydrated on physical examination, hypotensive with blood pressure 92/55, responding to IV fluid resuscitation   Discharge Exam: Vitals:   05/21/16 0537 05/21/16 1446  BP: 115/80 (!) 101/58  Pulse: (!) 55 (!) 59  Resp: 18 20  Temp: 98.6 F (37 C)     General exam: Nontoxic appearing awake and alert auscultation. Respiratory effort normal. Cardiovascular system: S1 & S2 heard, RRR. No JVD, murmurs, rubs, gallops or clicks. No pedal edema. Gastrointestinal system there is mild generalized tenderness to palpation Central nervous system: Alert and oriented. No focal neurological deficits. Extremities: Symmetric 5 x 5 power. Skin: No rashes, lesions or  ulcers Psychiatry: Judgement and insight appear normal. Mood & affect appropriate.   Discharge Instructions   Discharge Instructions    Call MD for:     Complete by:  As directed   Call MD for:  difficulty breathing, headache or visual disturbances    Complete by:  As directed   Call MD for:  extreme fatigue    Complete by:  As directed   Call MD for:  hives    Complete by:  As directed   Call MD for:  persistant dizziness or light-headedness    Complete by:  As directed   Call MD for:  persistant nausea and vomiting    Complete by:  As directed   Call MD for:  redness, tenderness, or signs of infection (pain, swelling, redness, odor or green/yellow discharge around incision site)    Complete by:  As directed   Call MD for:  severe uncontrolled pain    Complete by:  As directed   Call MD for:  temperature >100.4    Complete by:  As directed   Diet - low sodium heart healthy    Complete by:  As directed   Increase activity slowly    Complete by:  As directed     Current Discharge Medication List    START taking these medications   Details  oxyCODONE (OXY IR/ROXICODONE) 5 MG immediate release tablet Take 1 tablet (5 mg total) by mouth every 8 (eight) hours as needed for severe pain. Qty: 10 tablet, Refills: 0    vancomycin (VANCOCIN) 50 mg/mL oral solution Take 5 mLs (250 mg total) by mouth every 6 (six) hours. Qty: 300 mL, Refills: 0      CONTINUE these medications which have CHANGED   Details  ondansetron (ZOFRAN) 4 MG tablet Take 1 tablet (4 mg total) by mouth every 8 (eight) hours as needed for nausea or vomiting. Qty: 20 tablet, Refills: 0      CONTINUE these medications which have NOT CHANGED   Details  benzonatate (TESSALON) 100 MG capsule Take 1 capsule (100 mg total) by mouth every 8 (eight) hours. Qty: 21 capsule, Refills: 0      STOP taking these medications     acetaminophen-codeine (TYLENOL #3) 300-30 MG tablet      ciprofloxacin (CIPRO) 500 MG tablet      docusate sodium (COLACE) 100 MG capsule      lidocaine (XYLOCAINE) 2 % solution      metroNIDAZOLE (FLAGYL) 500 MG tablet      polyethylene glycol powder  (GLYCOLAX/MIRALAX) powder        Allergies  Allergen Reactions  . Sulfa Antibiotics Hives   Follow-up Information    Minerva Ends, MD Follow up in 1 week(s).   Specialty:  Family Medicine Contact information: [place] [address] [phone]            The results of significant diagnostics from this hospitalization (including imaging, microbiology, ancillary and laboratory) are listed below for reference.    Significant Diagnostic Studies: Ct Abdomen Pelvis W Contrast  Result Date: 05/18/2016 CLINICAL DATA:  Central abdominal pain with nausea, vomiting, and diarrhea for 2 weeks. Recent treatment for colitis. EXAM: CT ABDOMEN AND PELVIS WITH CONTRAST TECHNIQUE: Multidetector CT imaging of the abdomen and pelvis was performed using the standard protocol following bolus administration of intravenous contrast. CONTRAST:  100 mL Isovue-300 COMPARISON:  09/16/2015 FINDINGS: Atelectasis in the lung bases. The liver, spleen, gallbladder, pancreas, adrenal glands,  kidneys, abdominal aorta, and retroperitoneal lymph nodes are unremarkable. Mildly enlarged lymph nodes and hazy infiltration of the root of the mesenteric likely representing mesenteritis. Stomach, small bowel, and colon are not abnormally distended. Colon is decompressed but there appears to be diffuse wall thickening and edema of the colon. This is likely to indicate colitis, possibly infectious or pseudomembranous. No free air or free fluid in the abdomen. Pelvis: Bladder wall is not thickened. Uterus is surgically absent. No pelvic mass or lymphadenopathy. No free or loculated pelvic fluid collections. Appendix is normal. No destructive bone lesions. IMPRESSION: Diffuse colonic wall thickening and edema likely representing infectious or pseudomembranous colitis. Prominent lymph nodes and infiltration in the root of the mesenteric likely inflammatory and suggesting mesenteritis. No abscess or obstruction.  Electronically Signed   By: Lucienne Capers M.D.   On: 05/18/2016 05:37    Microbiology: Recent Results (from the past 240 hour(s))  Culture, blood (Routine X 2) w Reflex to ID Panel     Status: None (Preliminary result)   Collection Time: 05/18/16  8:15 AM  Result Value Ref Range Status   Specimen Description BLOOD LEFT ANTECUBITAL  Final   Special Requests BOTTLES DRAWN AEROBIC AND ANAEROBIC  10CC  Final   Culture NO GROWTH 3 DAYS  Final   Report Status PENDING  Incomplete  Culture, blood (Routine X 2) w Reflex to ID Panel     Status: None (Preliminary result)   Collection Time: 05/18/16  8:30 AM  Result Value Ref Range Status   Specimen Description BLOOD LEFT HAND  Final   Special Requests BOTTLES DRAWN AEROBIC AND ANAEROBIC  5CC  Final   Culture NO GROWTH 3 DAYS  Final   Report Status PENDING  Incomplete  Gastrointestinal Panel by PCR , Stool     Status: Abnormal   Collection Time: 05/18/16 12:42 PM  Result Value Ref Range Status   Campylobacter species NOT DETECTED NOT DETECTED Final   Plesimonas shigelloides NOT DETECTED NOT DETECTED Final   Salmonella species NOT DETECTED NOT DETECTED Final   Yersinia enterocolitica NOT DETECTED NOT DETECTED Final   Vibrio species NOT DETECTED NOT DETECTED Final   Vibrio cholerae NOT DETECTED NOT DETECTED Final   Enteroaggregative E coli (EAEC) NOT DETECTED NOT DETECTED Final   Enteropathogenic E coli (EPEC) NOT DETECTED NOT DETECTED Final   Enterotoxigenic E coli (ETEC) NOT DETECTED NOT DETECTED Final   Shiga like toxin producing E coli (STEC) NOT DETECTED NOT DETECTED Final   E. coli O157 NOT DETECTED NOT DETECTED Final   Shigella/Enteroinvasive E coli (EIEC) NOT DETECTED NOT DETECTED Final   Cryptosporidium DETECTED (A) NOT DETECTED Final   Cyclospora cayetanensis NOT DETECTED NOT DETECTED Final   Entamoeba histolytica NOT DETECTED NOT DETECTED Final   Giardia lamblia NOT DETECTED NOT DETECTED Final   Adenovirus F40/41 NOT DETECTED  NOT DETECTED Final   Astrovirus NOT DETECTED NOT DETECTED Final   Norovirus GI/GII NOT DETECTED NOT DETECTED Final   Rotavirus A NOT DETECTED NOT DETECTED Final   Sapovirus (I, II, IV, and V) NOT DETECTED NOT DETECTED Final  C difficile quick scan w PCR reflex     Status: Abnormal   Collection Time: 05/18/16 12:42 PM  Result Value Ref Range Status   C Diff antigen POSITIVE (A) NEGATIVE Final   C Diff toxin NEGATIVE NEGATIVE Final   C Diff interpretation Results are indeterminate. See PCR results.  Final  Clostridium Difficile by PCR     Status: Abnormal  Collection Time: 05/18/16 12:42 PM  Result Value Ref Range Status   Toxigenic C Difficile by pcr POSITIVE (A) NEGATIVE Final     Labs: Basic Metabolic Panel:  Recent Labs Lab 05/17/16 2328 05/18/16 0828 05/19/16 0935 05/20/16 0540  NA 140  --  138 140  K 3.3*  --  3.6 4.0  CL 110  --  113* 109  CO2 23  --  21* 23  GLUCOSE 133*  --  113* 91  BUN 6  --  <5* <5*  CREATININE 0.80  --  0.61 0.67  CALCIUM 9.2  --  8.4* 8.7*  MG  --  1.6* 2.1  --   PHOS  --  3.7  --   --    Liver Function Tests:  Recent Labs Lab 05/17/16 2328 05/19/16 0935  AST 31 32  ALT 23 22  ALKPHOS 55 38  BILITOT 0.3 0.3  PROT 6.6 5.4*  ALBUMIN 3.7 2.9*    Recent Labs Lab 05/17/16 2328  LIPASE 32   No results for input(s): AMMONIA in the last 168 hours. CBC:  Recent Labs Lab 05/17/16 2328 05/19/16 0935 05/20/16 0540  WBC 8.8 5.6 6.3  HGB 15.4* 13.0 12.9  HCT 45.3 39.6 38.0  MCV 96.6 97.3 95.7  PLT 225 185 195   Cardiac Enzymes: No results for input(s): CKTOTAL, CKMB, CKMBINDEX, TROPONINI in the last 168 hours. BNP: BNP (last 3 results) No results for input(s): BNP in the last 8760 hours.  ProBNP (last 3 results) No results for input(s): PROBNP in the last 8760 hours.  CBG: No results for input(s): GLUCAP in the last 168 hours.     Signed:  Kelvin Cellar MD.  [facility] 05/21/2016, 3:21 PM

## 2016-05-23 LAB — CULTURE, BLOOD (ROUTINE X 2)
CULTURE: NO GROWTH
Culture: NO GROWTH

## 2016-05-24 ENCOUNTER — Encounter: Payer: Self-pay | Admitting: Family Medicine

## 2016-05-24 ENCOUNTER — Ambulatory Visit: Payer: Self-pay | Attending: Family Medicine | Admitting: Family Medicine

## 2016-05-24 VITALS — BP 101/69 | HR 90 | Temp 98.1°F | Ht 61.0 in | Wt 157.8 lb

## 2016-05-24 DIAGNOSIS — R059 Cough, unspecified: Secondary | ICD-10-CM

## 2016-05-24 DIAGNOSIS — A0472 Enterocolitis due to Clostridium difficile, not specified as recurrent: Secondary | ICD-10-CM

## 2016-05-24 DIAGNOSIS — K219 Gastro-esophageal reflux disease without esophagitis: Secondary | ICD-10-CM | POA: Insufficient documentation

## 2016-05-24 DIAGNOSIS — A047 Enterocolitis due to Clostridium difficile: Secondary | ICD-10-CM | POA: Insufficient documentation

## 2016-05-24 DIAGNOSIS — R05 Cough: Secondary | ICD-10-CM | POA: Insufficient documentation

## 2016-05-24 DIAGNOSIS — F17210 Nicotine dependence, cigarettes, uncomplicated: Secondary | ICD-10-CM | POA: Insufficient documentation

## 2016-05-24 MED ORDER — BENZONATATE 100 MG PO CAPS: 100.0000 mg | ORAL_CAPSULE | Freq: Three times a day (TID) | ORAL | 0 refills | Status: DC

## 2016-05-24 MED ORDER — SACCHAROMYCES BOULARDII 250 MG PO CAPS: 250.0000 mg | ORAL_CAPSULE | Freq: Two times a day (BID) | ORAL | 2 refills | Status: DC

## 2016-05-24 MED ORDER — RANITIDINE HCL 150 MG PO TABS: 150.0000 mg | ORAL_TABLET | Freq: Two times a day (BID) | ORAL | 1 refills | Status: DC

## 2016-05-24 NOTE — Assessment & Plan Note (Signed)
Recent C diff colitis  Plan: Finish vanco Check BMP given associated hypokalemia Start florastar once done with vanco

## 2016-05-24 NOTE — Assessment & Plan Note (Signed)
Cough and hoarseness concerning for GERD. Keeping in mind patient's recent hospitalization she is at risk for HCAP.  Plan: Zantac Tessalon perles Reviewed s/s to prompt return to care

## 2016-05-24 NOTE — Progress Notes (Addendum)
Subjective:  Patient ID: April Hudson, female    DOB: [birth year]  Age: 45 y.o. MRN: [id number]  CC: Hospitalization Follow-up   [name] presents for    1. HFU for colitis: she was  Hospitalized for C diff colitis from 8/15-/05/21/16. She is taking oral vancomycin. No fever since being home. Diarrhea has stopped. Stool is still soft. She still does not have much appetite. She is drinking plenty of liquids. Bloating has improved. She has been written out of work until 05/31/16.  2. Cough: cough with hoarseness started 1 weeks ago. Worsened yesterday. Cough with a little phlegm. No CP or SOB. Some cramps in lateral chest. Sometimes L and sometimes R. She does have heartburn. No fever or chills.   Social History  Substance Use Topics  . Smoking status: Current Every Day Smoker    Packs/day: 1.00    Years: 29.00    Types: Cigarettes  . Smokeless tobacco: Never Used  . Alcohol use No    Outpatient Medications Prior to Visit  Medication Sig Dispense Refill  . oxyCODONE (OXY IR/ROXICODONE) 5 MG immediate release tablet Take 1 tablet (5 mg total) by mouth every 8 (eight) hours as needed for severe pain. 10 tablet 0  . vancomycin (VANCOCIN) 50 mg/mL oral solution Take 5 mLs (250 mg total) by mouth every 6 (six) hours. 300 mL 0  . benzonatate (TESSALON) 100 MG capsule Take 1 capsule (100 mg total) by mouth every 8 (eight) hours. (Patient not taking: Reported on 05/07/2016) 21 capsule 0  . ondansetron (ZOFRAN) 4 MG tablet Take 1 tablet (4 mg total) by mouth every 8 (eight) hours as needed for nausea or vomiting. (Patient not taking: Reported on 05/24/2016) 20 tablet 0   No facility-administered medications prior to visit.     ROS Review of Systems  Constitutional: Positive for appetite change. Negative for chills and fever.  HENT: Positive for voice change.   Eyes: Negative for visual disturbance.  Respiratory: Positive for cough. Negative for shortness of breath.     Cardiovascular: Positive for chest pain.  Gastrointestinal: Negative for abdominal pain and blood in stool.  Musculoskeletal: Negative for arthralgias and back pain.  Skin: Negative for rash.  Allergic/Immunologic: Negative for immunocompromised state.  Hematological: Negative for adenopathy. Does not bruise/bleed easily.  Psychiatric/Behavioral: Negative for dysphoric mood and suicidal ideas.    Objective:  BP 101/69 (BP Location: Left Arm)   Pulse 90   Temp 98.1 F (36.7 C) (Oral)   Ht 5\' 1"  (1.549 m)   Wt 157 lb 12.8 oz (71.6 kg)   SpO2 96%   BMI 29.82 kg/m   BP/Weight 05/24/2016 05/21/2016 0000000  Systolic BP 99991111 [id number] -  Diastolic BP 69 58 -  Wt. (Lbs) 157.8 - 160.6  BMI 29.82 - 30.35   Physical Exam  Constitutional: She is oriented to person, place, and time. She appears well-developed and well-nourished. No distress.  HENT:  Head: Normocephalic and atraumatic.  Right Ear: Tympanic membrane and ear canal normal.  Left Ear: External ear and ear canal normal. Tympanic membrane is scarred.  Ears:  Mouth/Throat: Uvula is midline, oropharynx is clear and moist and mucous membranes are normal.  Cardiovascular: Normal rate, regular rhythm, normal heart sounds and intact distal pulses.   Pulmonary/Chest: Effort normal and breath sounds normal. No respiratory distress. She has no wheezes. She has no rales. She exhibits tenderness.    Abdominal: Soft. Bowel sounds are normal. She exhibits no distension and no mass.  There is tenderness (slight tenderness LLQ ). There is no rebound and no guarding.  Musculoskeletal: She exhibits no edema.  Neurological: She is alert and oriented to person, place, and time.  Skin: Skin is warm and dry. No rash noted.  Psychiatric: She has a normal mood and affect.     Assessment & Plan:  April Hudson was seen today for hospitalization follow-up.  Diagnoses and all orders for this visit:  Clostridium difficile colitis -     BASIC METABOLIC  PANEL WITH GFR -     saccharomyces boulardii (FLORASTOR) 250 MG capsule; Take 1 capsule (250 mg total) by mouth 2 (two) times daily.  Gastroesophageal reflux disease, esophagitis presence not specified -     ranitidine (ZANTAC) 150 MG tablet; Take 1 tablet (150 mg total) by mouth 2 (two) times daily.  Cough -     Discontinue: benzonatate (TESSALON) 100 MG capsule; Take 1 capsule (100 mg total) by mouth every 8 (eight) hours. -     benzonatate (TESSALON) 100 MG capsule; Take 1 capsule (100 mg total) by mouth every 8 (eight) hours.   There are no diagnoses linked to this encounter.  No orders of the defined types were placed in this encounter.   Follow-up: Return in about 4 weeks (around 06/21/2016) for cough and flu shot .   Boykin Nearing MD

## 2016-05-24 NOTE — Patient Instructions (Addendum)
Raylynn was seen today for hospitalization follow-up.  Diagnoses and all orders for this visit:  Clostridium difficile colitis -     BASIC METABOLIC PANEL WITH GFR -     saccharomyces boulardii (FLORASTOR) 250 MG capsule; Take 1 capsule (250 mg total) by mouth 2 (two) times daily.  Gastroesophageal reflux disease, esophagitis presence not specified -     ranitidine (ZANTAC) 150 MG tablet; Take 1 tablet (150 mg total) by mouth 2 (two) times daily.  Cough -     Discontinue: benzonatate (TESSALON) 100 MG capsule; Take 1 capsule (100 mg total) by mouth every 8 (eight) hours. -     benzonatate (TESSALON) 100 MG capsule; Take 1 capsule (100 mg total) by mouth every 8 (eight) hours.   Once done with vancomycin, start daily probiotic, florastar Start zantac and tessalon perles for cough. If cough worsens especially with fever, chills, chest pain or shortness call right away and seek medical help after hours.   F/u in 4-6 weeks for cough and flu shot   Dr. Adrian Blackwater

## 2016-05-25 LAB — BASIC METABOLIC PANEL WITH GFR
BUN: 6 mg/dL — AB (ref 7–25)
CHLORIDE: 102 mmol/L (ref 98–110)
CO2: 26 mmol/L (ref 20–31)
CREATININE: 0.79 mg/dL (ref 0.50–1.10)
Calcium: 9.4 mg/dL (ref 8.6–10.2)
GFR, Est African American: >89 mL/min (ref >=60)
GFR, Est Non African American: >89 mL/min (ref >=60)
GLUCOSE: 65 mg/dL (ref 65–99)
POTASSIUM: 4 mmol/L (ref 3.5–5.3)
Sodium: 141 mmol/L (ref 135–146)

## 2016-05-26 ENCOUNTER — Telehealth: Payer: Self-pay | Admitting: Family Medicine

## 2016-05-26 ENCOUNTER — Telehealth: Payer: Self-pay

## 2016-05-26 DIAGNOSIS — M19032 Primary osteoarthritis, left wrist: Secondary | ICD-10-CM

## 2016-05-26 NOTE — Telephone Encounter (Signed)
Left message on patients voicemail to return the phone call.

## 2016-05-26 NOTE — Telephone Encounter (Signed)
Pt called back to review results.... °

## 2016-05-26 NOTE — Telephone Encounter (Signed)
Patient was informed of results 

## 2016-05-26 NOTE — Telephone Encounter (Signed)
Pt called states she is having pain and needs medication for pain. Pt is requesting oxyCODONE (OXY IR/ROXICODONE) 5 MG immediate release. States tylenol #3 does not work for her. Please f/up

## 2016-05-27 NOTE — Telephone Encounter (Signed)
Is this for her wrist pain? Per clinic policy tramadol and tylenol #3 are the only narcotics prescribed for chronic pain  Options: Non-narcotics medications  Gabapentin and NSAIDs  Pain management referral   Does patient have a preference?

## 2016-05-28 NOTE — Telephone Encounter (Signed)
Pt states that the medication is needed for stomach pain not wrist pain. Pt also states that she would prefer Tylenol #3

## 2016-05-31 ENCOUNTER — Other Ambulatory Visit: Payer: Self-pay

## 2016-05-31 MED ORDER — ACETAMINOPHEN-CODEINE 300-30 MG PO TABS: 1.0000 | ORAL_TABLET | Freq: Two times a day (BID) | ORAL | 0 refills | Status: DC | PRN

## 2016-05-31 NOTE — Progress Notes (Unsigned)
tylenol

## 2016-05-31 NOTE — Telephone Encounter (Signed)
Done

## 2016-05-31 NOTE — Telephone Encounter (Signed)
Pt was called and informed of script being ready for pick up.

## 2016-05-31 NOTE — Telephone Encounter (Signed)
I see,  This is for her recent C. Diff, colitis, not for a chronic pain issue.   I did see her in f/u on 05/24/16 and she still had some tenderness on abdominal exam.   I will  Be able to order tylenol #3 when I return to the office.  I will route request to Dr. Jarold Song to see if she will be willing to write Rx for her in my absence.

## 2016-06-25 ENCOUNTER — Encounter (HOSPITAL_COMMUNITY): Payer: Self-pay

## 2016-06-25 ENCOUNTER — Telehealth: Payer: Self-pay

## 2016-06-25 ENCOUNTER — Emergency Department (HOSPITAL_COMMUNITY)
Admission: EM | Admit: 2016-06-25 | Discharge: 2016-06-25 | Disposition: A | Discharge status: Home / Self Care | Payer: Self-pay | Attending: Emergency Medicine | Admitting: Emergency Medicine

## 2016-06-25 DIAGNOSIS — R1084 Generalized abdominal pain: Secondary | ICD-10-CM

## 2016-06-25 DIAGNOSIS — F17210 Nicotine dependence, cigarettes, uncomplicated: Secondary | ICD-10-CM | POA: Insufficient documentation

## 2016-06-25 DIAGNOSIS — K644 Residual hemorrhoidal skin tags: Secondary | ICD-10-CM | POA: Insufficient documentation

## 2016-06-25 LAB — CBC
HCT: 42.7 % (ref 36.0–46.0)
Hemoglobin: 14 g/dL (ref 12.0–15.0)
MCH: 31.8 pg (ref 26.0–34.0)
MCHC: 32.8 g/dL (ref 30.0–36.0)
MCV: 97 fL (ref 78.0–100.0)
PLATELETS: 195 10*3/uL (ref 150–400)
RBC: 4.4 MIL/uL (ref 3.87–5.11)
RDW: 14 % (ref 11.5–15.5)
WBC: 6.8 10*3/uL (ref 4.0–10.5)

## 2016-06-25 LAB — URINALYSIS, ROUTINE W REFLEX MICROSCOPIC
Bilirubin Urine: NEGATIVE
GLUCOSE, UA: NEGATIVE mg/dL
KETONES UR: NEGATIVE mg/dL
LEUKOCYTES UA: NEGATIVE
Nitrite: NEGATIVE
PROTEIN: NEGATIVE mg/dL
Specific Gravity, Urine: 1.008 (ref 1.005–1.030)
pH: 6 (ref 5.0–8.0)

## 2016-06-25 LAB — URINE MICROSCOPIC-ADD ON

## 2016-06-25 LAB — COMPREHENSIVE METABOLIC PANEL
ALK PHOS: 53 U/L (ref 38–126)
ALT: 11 U/L — AB (ref 14–54)
AST: 21 U/L (ref 15–41)
Albumin: 3.7 g/dL (ref 3.5–5.0)
Anion gap: 6 (ref 5–15)
BUN: 6 mg/dL (ref 6–20)
CALCIUM: 9.2 mg/dL (ref 8.9–10.3)
CO2: 25 mmol/L (ref 22–32)
CREATININE: 0.7 mg/dL (ref 0.44–1.00)
Chloride: 108 mmol/L (ref 101–111)
Glucose, Bld: 109 mg/dL — ABNORMAL HIGH (ref 65–99)
Potassium: 4 mmol/L (ref 3.5–5.1)
Sodium: 139 mmol/L (ref 135–145)
Total Bilirubin: 0.3 mg/dL (ref 0.3–1.2)
Total Protein: 6.9 g/dL (ref 6.5–8.1)

## 2016-06-25 LAB — POC OCCULT BLOOD, ED: FECAL OCCULT BLD: NEGATIVE

## 2016-06-25 LAB — LIPASE, BLOOD: Lipase: 44 U/L (ref 11–51)

## 2016-06-25 LAB — I-STAT BETA HCG BLOOD, ED (MC, WL, AP ONLY)

## 2016-06-25 MED ORDER — SODIUM CHLORIDE 0.9 % IV BOLUS (SEPSIS): 1000.0000 mL | Freq: Once | INTRAVENOUS | Status: DC

## 2016-06-25 MED ORDER — SODIUM CHLORIDE 0.9 % IV BOLUS (SEPSIS)
1000.0000 mL | Freq: Once | INTRAVENOUS | Status: AC
Start: 2016-06-25 — End: 2016-06-25
  Administered 2016-06-25: 1000 mL via INTRAVENOUS

## 2016-06-25 MED ORDER — ONDANSETRON HCL 4 MG/2ML IJ SOLN
4.0000 mg | Freq: Once | INTRAMUSCULAR | Status: AC
  Administered 2016-06-25: 4 mg via INTRAVENOUS
  Filled 2016-06-25: qty 2

## 2016-06-25 MED ORDER — ONDANSETRON 4 MG PO TBDP: 4.0000 mg | ORAL_TABLET | Freq: Three times a day (TID) | ORAL | 0 refills | Status: DC | PRN

## 2016-06-25 MED ORDER — MORPHINE SULFATE (PF) 4 MG/ML IV SOLN
4.0000 mg | Freq: Once | INTRAVENOUS | Status: AC
  Administered 2016-06-25: 4 mg via INTRAVENOUS
  Filled 2016-06-25: qty 1

## 2016-06-25 NOTE — ED Notes (Signed)
Pt is sipping on water for fluid challenge.

## 2016-06-25 NOTE — ED Notes (Signed)
PT is in stable condition upon d/c and ambulates from ED. 

## 2016-06-25 NOTE — ED Triage Notes (Signed)
Pt here with c/o generalized abdominal pain that started Tuesday associated with nausea and diarrhea, no vomiting. States she was diagnosed with colitis in August with same type of pain.

## 2016-06-25 NOTE — ED Provider Notes (Signed)
[facility] DEPT Provider Note   CSN: [id number] Arrival date & time: 06/25/16  1130     History   Chief Complaint Chief Complaint  Patient presents with  . Abdominal Pain    [name] is a 45 y.o. female.  The history is provided by the patient and medical records. No language interpreter was used.  Abdominal Pain   Associated symptoms include diarrhea and nausea. Pertinent negatives include fever, vomiting, dysuria and headaches.   April Hudson is a 45 y.o. female  who presents to the Emergency Department complaining of worsening generalized abdominal pain x 3-4 days. Patient states her symptoms today are similar to symptoms she experienced last month when she was admitted for her abdominal pain. Per chart review, patient was seen in ED on 8/04 and treated for colitis with Cipro and Flagyl. She returned to ED on 8/15 where patient was covered empirically with Zosyn and PO vancomycin. C. difficile came back positive. This discharged on 8/18 on PO vancomycin and followed up with PCP on 8/21 and appears to have been doing well. Patient states that her generalized abdominal pain has returned and that she had 1-2 loose stools with bright red blood. Patient also endorses associated nausea. Denies emesis or fever. Ibuprofen taken with little relief.   Past Medical History:  Diagnosis Date  . Allergy   . Anemia    since at least 2013  . Childhood asthma   . Chronic bronchitis ([place])   . GERD (gastroesophageal reflux disease) Dx 2015  . History of frequent urinary tract infections   . History of gastric ulcer    clinical diagnosis, no prior EGD  . Joint pain   . Migraines Dx 2013   "not very often" (05/18/2016)  . Smoker     Patient Active Problem List   Diagnosis Date Noted  . Esophageal reflux 05/24/2016  . Cough 05/24/2016  . Clostridium difficile colitis 05/18/2016  . Anal fissure 10/23/2015  . Epidermoid cyst 09/02/2015  . Rectal polyp,focal high grade  dysplasia 06/27/2015  . Tinea pedis 05/26/2015  . Pain of left thumb 05/26/2015  . 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 De Quervain's tenosynovitis, left 04/14/2015  . Neck pain on right side 01/13/2015  . Carpal tunnel syndrome 12/30/2014  . Smoker 07/03/2014  . Family history of stomach cancer 07/03/2014    Past Surgical History:  Procedure Laterality Date  . COLONOSCOPY    . KNEE ARTHROSCOPY Left ~ 2015  . POLYPECTOMY    . VAGINAL HYSTERECTOMY  2006   for fibroids and heavy menstrual bleeding     OB History    No data available       Home Medications    Prior to Admission medications   Medication Sig Start Date End Date Taking? Authorizing Provider  ibuprofen (ADVIL,MOTRIN) 200 MG tablet Take 400 mg by mouth every 6 (six) hours as needed for mild pain.   Yes Historical Provider, MD  ranitidine (ZANTAC) 150 MG tablet Take 1 tablet (150 mg total) by mouth 2 (two) times daily. 05/24/16  Yes Josalyn Funches, MD  saccharomyces boulardii (FLORASTOR) 250 MG capsule Take 1 capsule (250 mg total) by mouth 2 (two) times daily. 05/24/16  Yes Josalyn Funches, MD  Acetaminophen-Codeine (TYLENOL/CODEINE #3) 300-30 MG tablet Take 1 tablet by mouth every 12 (twelve) hours as needed for pain. Patient not taking: Reported on 06/25/2016 05/31/16   Arnoldo Morale, MD  benzonatate (TESSALON) 100 MG capsule Take 1 capsule (100 mg total)  by mouth every 8 (eight) hours. Patient not taking: Reported on 06/25/2016 05/24/16   Boykin Nearing, MD  ondansetron (ZOFRAN ODT) 4 MG disintegrating tablet Take 1 tablet (4 mg total) by mouth every 8 (eight) hours as needed for nausea or vomiting. 06/25/16   Ozella Almond Jordie Skalsky, PA-C  oxyCODONE (OXY IR/ROXICODONE) 5 MG immediate release tablet Take 1 tablet (5 mg total) by mouth every 8 (eight) hours as needed for severe pain. Patient not taking: Reported on 06/25/2016 05/21/16   Kelvin Cellar, MD  vancomycin (VANCOCIN) 50 mg/mL oral solution Take 5 mLs (250 mg  total) by mouth every 6 (six) hours. Patient not taking: Reported on 06/25/2016 05/21/16   Kelvin Cellar, MD    Family History Family History  Problem Relation Age of Onset  . Diabetes Mother   . Hypertension Mother   . Ovarian cancer Mother   . Stomach cancer Maternal Uncle   . Heart disease Sister   . Healthy Brother   . Asthma Son   . Asthma Daughter   . Colon cancer Maternal Uncle   . Colon polyps Neg Hx   . Esophageal cancer Neg Hx   . Rectal cancer Neg Hx     Social History Social History  Substance Use Topics  . Smoking status: Current Every Day Smoker    Packs/day: 1.00    Years: 29.00    Types: Cigarettes  . Smokeless tobacco: Never Used  . Alcohol use No     Allergies   Sulfa antibiotics   Review of Systems Review of Systems  Constitutional: Negative for chills and fever.  HENT: Negative for congestion.   Eyes: Negative for visual disturbance.  Respiratory: Negative for cough and shortness of breath.   Cardiovascular: Negative.   Gastrointestinal: Positive for abdominal pain, blood in stool, diarrhea and nausea. Negative for vomiting.  Genitourinary: Negative for dysuria and vaginal discharge.  Musculoskeletal: Negative for back pain and neck pain.  Skin: Negative for rash.  Neurological: Negative for headaches.     Physical Exam Updated Vital Signs BP 97/73 (BP Location: Right Arm)   Pulse 76   Temp 98.1 F (36.7 C) (Oral)   Resp 18   SpO2 99%   Physical Exam  Constitutional: She is oriented to person, place, and time. She appears well-developed and well-nourished. No distress.  Non-toxic appearing  HENT:  Head: Normocephalic and atraumatic.  Cardiovascular: Normal rate, regular rhythm and normal heart sounds.  Exam reveals no gallop and no friction rub.   No murmur heard. Pulmonary/Chest: Effort normal and breath sounds normal. No respiratory distress. She has no wheezes. She has no rales. She exhibits no tenderness.  Abdominal: Soft.  Bowel sounds are normal. She exhibits no distension. There is tenderness (Generalized).  Genitourinary: Rectal exam shows external hemorrhoid. Rectal exam shows no tenderness.  Genitourinary Comments: Hemoccult negative.   Neurological: She is alert and oriented to person, place, and time.  Skin: Skin is warm and dry.  Nursing note and vitals reviewed.    ED Treatments / Results  Labs (all labs ordered are listed, but only abnormal results are displayed) Labs Reviewed  COMPREHENSIVE METABOLIC PANEL - Abnormal; Notable for the following:       Result Value   Glucose, Bld 109 (*)    ALT 11 (*)    All other components within normal limits  URINALYSIS, ROUTINE W REFLEX MICROSCOPIC (NOT AT [facility]) - Abnormal; Notable for the following:    Hgb urine dipstick SMALL (*)  All other components within normal limits  URINE MICROSCOPIC-ADD ON - Abnormal; Notable for the following:    Squamous Epithelial / LPF 0-5 (*)    Bacteria, UA RARE (*)    All other components within normal limits  LIPASE, BLOOD  CBC  OCCULT BLOOD X 1 CARD TO LAB, STOOL  I-STAT BETA HCG BLOOD, ED ([facility], [facility], [facility] ONLY)  POC OCCULT BLOOD, ED    EKG  EKG Interpretation None       Radiology No results found.  Procedures Procedures (including critical care time)  Medications Ordered in ED Medications  sodium chloride 0.9 % bolus 1,000 mL (not administered)  sodium chloride 0.9 % bolus 1,000 mL (1,000 mLs Intravenous New Bag/Given 06/25/16 1259)  ondansetron (ZOFRAN) injection 4 mg (4 mg Intravenous Given 06/25/16 1259)  morphine 4 MG/ML injection 4 mg (4 mg Intravenous Given 06/25/16 1259)     Initial Impression / Assessment and Plan / ED Course  I have reviewed the triage vital signs and the nursing notes.  Pertinent labs & imaging results that were available during my care of the patient were reviewed by me and considered in my medical decision making (see chart for details).  Clinical Course   April Hudson is a 45 y.o. female who presents to ED for generalized abdominal pain. Similar sxs one month ago and chart reviewed from visit. On exam, patient afebrile and nontoxic appearing. Nonsurgical abdomen.   Hemoccult negative. Labs reviewed and reassuring.  Fluids, zofran and pain meds given.   Patient reevaluated and feels much improved. Able to tolerate PO. Repeat abdominal exam unchanged. PCP follow up strongly recommended and patient agrees to do so. PCP is working on getting patient to see GI. I have also provided GI referral and encouraged her to follow up with them. Reasons to return to ED discussed and all questions answered.   Patient seen by and discussed with Dr. Christy Gentles who agrees with treatment plan.   Final Clinical Impressions(s) / ED Diagnoses   Final diagnoses:  Generalized abdominal pain    New Prescriptions New Prescriptions   ONDANSETRON (ZOFRAN ODT) 4 MG DISINTEGRATING TABLET    Take 1 tablet (4 mg total) by mouth every 8 (eight) hours as needed for nausea or vomiting.     [facility] Murrel Freet, PA-C 06/25/16 1531    Ripley Fraise, MD 06/26/16 [phone]

## 2016-06-25 NOTE — Telephone Encounter (Signed)
Pt contacted the office and stated her stomach is starting to hurt and do the same thing when she was in the hospital. Pt states she is having diarrhea. Pt states her pain level is a 8. Pt states she took some otc ibuprofen. Pt states she was out of the hydrocodone she only received 30 pills. Pt states she got discharged from the hospital on the 8th of August. And she wanted to know what should she do. I saw Dr. Adrian Blackwater and I made her aware of the situation and asked her for recommendations. Per Dr. Adrian Blackwater if patient is feeling dizzy, lightheadedness and is unable to keep fluids she will need to go to the ED. Pt states she doesn't feel any of them symptoms but she will probably go to the ED or urgent care.

## 2016-06-25 NOTE — ED Provider Notes (Signed)
Patient signed out to me by [facility] PA-C. Patient is feeling better after meds. Awaiting PO challenge.  PO challenge tolerated well per nursing. Will d/c.    Recardo Evangelist, PA-C 06/25/16 1647    Ripley Fraise, MD 06/26/16 [phone]

## 2016-06-25 NOTE — Discharge Instructions (Addendum)
Please follow up with the GI clinic listed for discussion of today's visit.  Zofran as needed for nausea. Increase hydration with small sips of water.   Please seek immediate care if you develop any of the following symptoms: The pain does not go away.  You have a fever.  You keep throwing up (vomiting).  You pass bloody or black tarry stools.  There is bright red blood in the stool.  There is rectal pain.  You do not seem to be getting better.  You have any questions or concerns.

## 2016-06-29 ENCOUNTER — Telehealth: Payer: Self-pay | Admitting: Gastroenterology

## 2016-06-29 NOTE — Telephone Encounter (Signed)
Patient reports abdominal pain and nausea.  Hx of colitis with admission in August. She will come in and see Tye Savoy RNP on 07/08/16 1:30

## 2016-07-08 ENCOUNTER — Encounter: Payer: Self-pay | Admitting: Nurse Practitioner

## 2016-07-08 ENCOUNTER — Ambulatory Visit (INDEPENDENT_AMBULATORY_CARE_PROVIDER_SITE_OTHER): Payer: Self-pay | Admitting: Nurse Practitioner

## 2016-07-08 VITALS — BP 90/60 | HR 88 | Ht 60.25 in | Wt 156.5 lb

## 2016-07-08 DIAGNOSIS — R1030 Lower abdominal pain, unspecified: Secondary | ICD-10-CM

## 2016-07-08 MED ORDER — METHOCARBAMOL 500 MG PO TABS: ORAL_TABLET | ORAL | 0 refills | Status: DC

## 2016-07-08 NOTE — Progress Notes (Signed)
     HPI: This is a 45 year old female known to [place] for history of TVA colon polyps.  She was admitted mid August with c-diff without the usual risk factors for it. She completed 2 weeks of vancomycin. Her stools are now soft, 3 times a day so she is not far from baseline.  Patient presents with persistent lower abdominal pain, same as when she had c-diff.  episodes intermittent occurring several times a week, improves but doesn't resolve with motrin. Bending down and sitting aggravates the pain. No pain when lying completlt still. No urinary or vaginal symptoms. No known back problems.   Patient evaluated for the pain by ED 06/27/16. Chem profile ,CBC and u/ a unremarkable, heme negative.    Past Medical History:  Diagnosis Date  . Allergy   . Anemia    since at least 2013  . Childhood asthma   . Chronic bronchitis ([place])   . GERD (gastroesophageal reflux disease) Dx 2015  . History of frequent urinary tract infections   . History of gastric ulcer    clinical diagnosis, no prior EGD  . Joint pain   . Migraines Dx 2013   "not very often" (05/18/2016)  . Smoker     Patient's surgical history, family medical history, social history, and allergies were all reviewed in Epic    Physical Exam: BP 90/60 (BP Location: Left Arm, Patient Position: Sitting, Cuff Size: Normal)   Pulse 88   Ht 5' 0.25" (1.53 m)   Wt 156 lb 8 oz (71 kg)   BMI 30.31 kg/m   GENERAL: pleasant well developed black female in  NAD PSYCH: :Pleasant, cooperative, normal affect HEENT: Normocephalic, conjunctiva pink, mucous membranes moist, neck supple without masses CARDIAC:  RRR, no murmur heard, no peripheral edema PULM: Normal respiratory effort, lungs CTA bilaterally, no wheezing ABDOMEN:  soft, nondistended, no obvious masses, no hepatomegaly,  normal bowel sounds. Mild mid lower abdomien and RLQ tenderness (see MS exam) MUSCULOSKELETAL: : Mid lower abdomen and RLQ abdominal wall discomfort  reproducible with sit-up, and bending NEURO: Alert and oriented x 3, no focal neurologic deficits  ASSESSMENT: 45 year old female  With recent admission for c-diff colitis. CTscan showed diffuse bowel wall thickening / mesenteritis. She completed 2 weeks of vancomycin. Of course her abdominal discomfort could be from recent C-diff colitis but oddly enough there seems to be a musculoskeletal component based on history and today's exam. She looks okay, stools slowly returning to baseline.  PLAN:  -Trial of robaxin x one week.. May cause drowsiness. Continue motrin 400mg  BID for an additional week. If no improvement will try Bentyl. She will call if symptoms worsen or if recurrent diarrhea

## 2016-07-08 NOTE — Patient Instructions (Signed)
We sent a prescription for Robaxin 500 mg to [facility].  Do not drive or work while taking medication.  Take Motrin 400 mg twice daily for 7 days.   Call us with a condition update in 7-10-days.

## 2016-07-10 NOTE — Progress Notes (Signed)
I agree with the above note, plan 

## 2016-07-14 ENCOUNTER — Encounter: Payer: Self-pay | Admitting: Gastroenterology

## 2016-07-23 ENCOUNTER — Encounter: Payer: Self-pay | Admitting: Gastroenterology

## 2016-08-11 ENCOUNTER — Encounter (HOSPITAL_COMMUNITY): Payer: Self-pay

## 2016-08-11 ENCOUNTER — Emergency Department (HOSPITAL_COMMUNITY)
Admission: EM | Admit: 2016-08-11 | Discharge: 2016-08-11 | Disposition: A | Discharge status: Home / Self Care | Payer: Self-pay | Attending: Physician Assistant | Admitting: Physician Assistant

## 2016-08-11 DIAGNOSIS — F17210 Nicotine dependence, cigarettes, uncomplicated: Secondary | ICD-10-CM | POA: Insufficient documentation

## 2016-08-11 DIAGNOSIS — Z79899 Other long term (current) drug therapy: Secondary | ICD-10-CM | POA: Insufficient documentation

## 2016-08-11 DIAGNOSIS — J45909 Unspecified asthma, uncomplicated: Secondary | ICD-10-CM | POA: Insufficient documentation

## 2016-08-11 DIAGNOSIS — K529 Noninfective gastroenteritis and colitis, unspecified: Secondary | ICD-10-CM | POA: Insufficient documentation

## 2016-08-11 LAB — COMPREHENSIVE METABOLIC PANEL
ALK PHOS: 61 U/L (ref 38–126)
ALT: 11 U/L — ABNORMAL LOW (ref 14–54)
AST: 16 U/L (ref 15–41)
Albumin: 3.7 g/dL (ref 3.5–5.0)
Anion gap: 8 (ref 5–15)
BILIRUBIN TOTAL: 0.7 mg/dL (ref 0.3–1.2)
BUN: 11 mg/dL (ref 6–20)
CALCIUM: 9.2 mg/dL (ref 8.9–10.3)
CO2: 25 mmol/L (ref 22–32)
Chloride: 108 mmol/L (ref 101–111)
Creatinine, Ser: 0.84 mg/dL (ref 0.44–1.00)
GFR calc Af Amer: >60 mL/min (ref >60)
Glucose, Bld: 81 mg/dL (ref 65–99)
POTASSIUM: 3.8 mmol/L (ref 3.5–5.1)
Sodium: 141 mmol/L (ref 135–145)
TOTAL PROTEIN: 6.8 g/dL (ref 6.5–8.1)

## 2016-08-11 LAB — CBC
HEMATOCRIT: 41.8 % (ref 36.0–46.0)
Hemoglobin: 14.1 g/dL (ref 12.0–15.0)
MCH: 31.5 pg (ref 26.0–34.0)
MCHC: 33.7 g/dL (ref 30.0–36.0)
MCV: 93.5 fL (ref 78.0–100.0)
PLATELETS: 192 10*3/uL (ref 150–400)
RBC: 4.47 MIL/uL (ref 3.87–5.11)
RDW: 13.8 % (ref 11.5–15.5)
WBC: 5.7 10*3/uL (ref 4.0–10.5)

## 2016-08-11 LAB — URINALYSIS, ROUTINE W REFLEX MICROSCOPIC
BILIRUBIN URINE: NEGATIVE
Glucose, UA: NEGATIVE mg/dL
KETONES UR: NEGATIVE mg/dL
LEUKOCYTES UA: NEGATIVE
NITRITE: NEGATIVE
PH: 6 (ref 5.0–8.0)
PROTEIN: NEGATIVE mg/dL
Specific Gravity, Urine: 1.025 (ref 1.005–1.030)

## 2016-08-11 LAB — LIPASE, BLOOD: Lipase: 35 U/L (ref 11–51)

## 2016-08-11 LAB — URINE MICROSCOPIC-ADD ON

## 2016-08-11 MED ORDER — LOPERAMIDE HCL 2 MG PO CAPS: 2.0000 mg | ORAL_CAPSULE | Freq: Four times a day (QID) | ORAL | 0 refills | Status: DC | PRN

## 2016-08-11 MED ORDER — DICYCLOMINE HCL 10 MG PO CAPS
10.0000 mg | ORAL_CAPSULE | Freq: Once | ORAL | Status: AC
Start: 2016-08-11 — End: 2016-08-11
  Administered 2016-08-11: 10 mg via ORAL
  Filled 2016-08-11: qty 1

## 2016-08-11 MED ORDER — SODIUM CHLORIDE 0.9 % IV BOLUS (SEPSIS)
1000.0000 mL | Freq: Once | INTRAVENOUS | Status: AC
Start: 2016-08-11 — End: 2016-08-11
  Administered 2016-08-11: 1000 mL via INTRAVENOUS

## 2016-08-11 MED ORDER — ONDANSETRON HCL 4 MG/2ML IJ SOLN
4.0000 mg | Freq: Once | INTRAMUSCULAR | Status: AC
  Administered 2016-08-11: 4 mg via INTRAVENOUS
  Filled 2016-08-11: qty 2

## 2016-08-11 MED ORDER — ONDANSETRON 4 MG PO TBDP: 4.0000 mg | ORAL_TABLET | Freq: Three times a day (TID) | ORAL | 0 refills | Status: DC | PRN

## 2016-08-11 NOTE — Discharge Instructions (Signed)
Take your medications as prescribed as needed for nausea/vomiting or diarrhea. Continue drinking fluids at home to remain hydrated. I recommend eating a bland diet for the next few days and take her symptoms have improved. I recommend following up with your primary care provider in the next week if your symptoms have not improved. Please return to the Emergency Department if symptoms worsen or new onset of fever, chest pain, difficulty breathing, vomiting blood, unable to keep fluids down, new/worsening abdominal pain, increased episodes of diarrhea, blood in stool.

## 2016-08-11 NOTE — ED Triage Notes (Signed)
Patient complains of generalized abdominal pain with cramping since am. Vomiting and diarrhea with same. Alert and oriented, NAD

## 2016-08-11 NOTE — ED Provider Notes (Signed)
[facility] DEPT Provider Note   CSN: [id number] Arrival date & time: 08/11/16  1022     History   Chief Complaint Chief Complaint  Patient presents with  . Abdominal Pain    [name] is a 45 y.o. female.  HPI   Patient is a 45 year old female with history of gastric ulcer, anemia, GERD, C. difficile colitis who presents to the ED with complaint of abdominal pain. Patient reports she has had intermittent sharp lower abdominal pain for the past 5 days but notes this morning when she woke up her abdominal pain had worsened in has remained constant. She also reports having more than 3 episodes of nonbloody diarrhea daily since onset of symptoms. Endorses associated nausea with one episode of NBNB vomiting and chills. Patient reports having similar episodes a few months ago when she was diagnosed with colitis and admitted in the hospital for antibiotic treatment. Denies fever, shortness of breath, chest pain, hematemesis, urinary symptoms, blood in urine or stool, vaginal bleeding, vaginal discharge, flank pain. Endorses abdominal surgical history of total hysterectomy. Denies taking any medications at home for her symptoms. Denies any recent antibiotic use, other recent hospitalizations, recent travel onset of the [place]. Denies any known sick contacts.  Past Medical History:  Diagnosis Date  . Allergy   . Anemia    since at least 2013  . Childhood asthma   . Chronic bronchitis ([place])   . GERD (gastroesophageal reflux disease) Dx 2015  . History of frequent urinary tract infections   . History of gastric ulcer    clinical diagnosis, no prior EGD  . Joint pain   . Migraines Dx 2013   "not very often" (05/18/2016)  . Smoker     Patient Active Problem List   Diagnosis Date Noted  . Esophageal reflux 05/24/2016  . Cough 05/24/2016  . Clostridium difficile colitis 05/18/2016  . Anal fissure 10/23/2015  . Epidermoid cyst 09/02/2015  .  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 Tinea pedis 05/26/2015  . Pain of left thumb 05/26/2015  . 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 De Quervain's tenosynovitis, left 04/14/2015  . Neck pain on right side 01/13/2015  . Carpal tunnel syndrome 12/30/2014  . Smoker 07/03/2014  . Family history of stomach cancer 07/03/2014    Past Surgical History:  Procedure Laterality Date  . COLONOSCOPY    . KNEE ARTHROSCOPY Left ~ 2015  . POLYPECTOMY    . VAGINAL HYSTERECTOMY  2006   for fibroids and heavy menstrual bleeding     OB History    No data available       Home Medications    Prior to Admission medications   Medication Sig Start Date End Date Taking? Authorizing Provider  ibuprofen (ADVIL,MOTRIN) 200 MG tablet Take 400 mg by mouth every 6 (six) hours as needed for mild pain.    Historical Provider, MD  loperamide (IMODIUM) 2 MG capsule Take 1 capsule (2 mg total) by mouth 4 (four) times daily as needed for diarrhea or loose stools. 08/11/16   Nona Dell, PA-C  methocarbamol (ROBAXIN) 500 MG tablet Take 1 tab twice daily for 7 days. Patient not taking: Reported on 08/11/2016 07/08/16   Willia Craze, NP  ondansetron (ZOFRAN ODT) 4 MG disintegrating tablet Take 1 tablet (4 mg total) by mouth every 8 (eight) hours as needed for nausea or vomiting. 08/11/16   Nona Dell, PA-C  ranitidine (ZANTAC) 150 MG  tablet Take 1 tablet (150 mg total) by mouth 2 (two) times daily. Patient not taking: Reported on 08/11/2016 05/24/16   Boykin Nearing, MD    Family History Family History  Problem Relation Age of Onset  . Diabetes Mother   . Hypertension Mother   . Ovarian cancer Mother   . Stomach cancer Maternal Uncle   . Heart disease Sister   . Healthy Brother   . Asthma Son   . Asthma Daughter   . Colon cancer Maternal Uncle   . Colon polyps Neg Hx   . Esophageal cancer Neg Hx   . Rectal cancer Neg Hx     Social History Social History    Substance Use Topics  . Smoking status: Current Every Day Smoker    Packs/day: 1.00    Years: 29.00    Types: Cigarettes  . Smokeless tobacco: Never Used  . Alcohol use No     Allergies   Sulfa antibiotics   Review of Systems Review of Systems  Constitutional: Positive for chills.  Gastrointestinal: Positive for abdominal pain, diarrhea, nausea and vomiting.  All other systems reviewed and are negative.    Physical Exam Updated Vital Signs BP 93/65   Pulse 74   Temp 98 F (36.7 C) (Oral)   Resp 16   Ht 5' 1.5" (1.562 m)   Wt 75.8 kg   SpO2 100%   BMI 31.04 kg/m   Physical Exam  Constitutional: She is oriented to person, place, and time. She appears well-developed and well-nourished.  HENT:  Head: Normocephalic and atraumatic.  Mouth/Throat: Uvula is midline and oropharynx is clear and moist. Mucous membranes are dry. No oropharyngeal exudate, posterior oropharyngeal edema, posterior oropharyngeal erythema or tonsillar abscesses. No tonsillar exudate.  Eyes: Conjunctivae and EOM are normal. Right eye exhibits no discharge. Left eye exhibits no discharge. No scleral icterus.  Neck: Normal range of motion. Neck supple.  Cardiovascular: Normal rate, regular rhythm, normal heart sounds and intact distal pulses.   Pulmonary/Chest: Effort normal and breath sounds normal. No respiratory distress. She has no wheezes. She exhibits no tenderness.  Abdominal: Soft. Bowel sounds are normal. She exhibits no distension and no mass. There is tenderness. There is no rebound and no guarding. No hernia.  TTP over lower abdominal quadrants  Musculoskeletal: Normal range of motion. She exhibits no edema.  Neurological: She is alert and oriented to person, place, and time.  Skin: Skin is warm and dry. She is not diaphoretic.  Nursing note and vitals reviewed.    ED Treatments / Results  Labs (all labs ordered are listed, but only abnormal results are displayed) Labs Reviewed   COMPREHENSIVE METABOLIC PANEL - Abnormal; Notable for the following:       Result Value   ALT 11 (*)    All other components within normal limits  URINALYSIS, ROUTINE W REFLEX MICROSCOPIC (NOT AT [facility]) - Abnormal; Notable for the following:    Hgb urine dipstick MODERATE (*)    All other components within normal limits  URINE MICROSCOPIC-ADD ON - Abnormal; Notable for the following:    Squamous Epithelial / LPF 0-5 (*)    Bacteria, UA RARE (*)    All other components within normal limits  LIPASE, BLOOD  CBC    EKG  EKG Interpretation None       Radiology No results found.  Procedures Procedures (including critical care time)  Medications Ordered in ED Medications  sodium chloride 0.9 % bolus 1,000 mL (1,000 mLs  Intravenous New Bag/Given 08/11/16 1147)  ondansetron (ZOFRAN) injection 4 mg (4 mg Intravenous Given 08/11/16 1150)  dicyclomine (BENTYL) capsule 10 mg (10 mg Oral Given 08/11/16 1352)     Initial Impression / Assessment and Plan / ED Course  I have reviewed the triage vital signs and the nursing notes.  Pertinent labs & imaging results that were available during my care of the patient were reviewed by me and considered in my medical decision making (see chart for details).  Clinical Course     Patient presents with abdominal pain with associated vomiting and diarrhea. Denies fever. Denies any known sick contacts, recent use of antibiotics, recent hospitalizations or recent travel outside the [place]. She notes she was admitted for C. difficile colitis 3 months ago. VSS. Exam revealed mild tenderness over lower abdominal quadrants, no peritoneal signs. No CVA tenderness. Patient reports history of total hysterectomy. Patient given IV fluids and Zofran. Labs unremarkable. On reevaluation patient reports her abdominal pain has improved and denies any episodes of vomiting or diarrhea since arrival to the ED. Suspect patient's symptoms are likely due to viral  gastroenteritis. Patient able to tolerate by mouth. Discussed results and plan for discharge with patient. Plan to discharge patient home with symptomatic treatment and be follow-up. Discussed return precautions.  Final Clinical Impressions(s) / ED Diagnoses   Final diagnoses:  Gastroenteritis    New Prescriptions New Prescriptions   LOPERAMIDE (IMODIUM) 2 MG CAPSULE    Take 1 capsule (2 mg total) by mouth 4 (four) times daily as needed for diarrhea or loose stools.   ONDANSETRON (ZOFRAN ODT) 4 MG DISINTEGRATING TABLET    Take 1 tablet (4 mg total) by mouth every 8 (eight) hours as needed for nausea or vomiting.     Chesley Noon [place], [place] 08/11/16 [place], MD 08/12/16 [phone]

## 2016-09-16 ENCOUNTER — Ambulatory Visit: Payer: Self-pay | Attending: Family Medicine | Admitting: Family Medicine

## 2016-09-16 ENCOUNTER — Encounter: Payer: Self-pay | Admitting: Family Medicine

## 2016-09-16 VITALS — BP 93/61 | HR 86 | Temp 97.9°F | Ht 60.0 in | Wt 162.0 lb

## 2016-09-16 DIAGNOSIS — G5601 Carpal tunnel syndrome, right upper limb: Secondary | ICD-10-CM | POA: Insufficient documentation

## 2016-09-16 DIAGNOSIS — M654 Radial styloid tenosynovitis [de Quervain]: Secondary | ICD-10-CM | POA: Insufficient documentation

## 2016-09-16 DIAGNOSIS — F17210 Nicotine dependence, cigarettes, uncomplicated: Secondary | ICD-10-CM | POA: Insufficient documentation

## 2016-09-16 DIAGNOSIS — M67431 Ganglion, right wrist: Secondary | ICD-10-CM | POA: Insufficient documentation

## 2016-09-16 DIAGNOSIS — M25532 Pain in left wrist: Secondary | ICD-10-CM | POA: Insufficient documentation

## 2016-09-16 MED ORDER — TRAMADOL HCL 50 MG PO TABS: 50.0000 mg | ORAL_TABLET | Freq: Four times a day (QID) | ORAL | 0 refills | Status: DC | PRN

## 2016-09-16 MED ORDER — METHYLPREDNISOLONE 4 MG PO TBPK: ORAL_TABLET | ORAL | 0 refills | Status: DC

## 2016-09-16 NOTE — Progress Notes (Signed)
Subjective:  Patient ID: April Hudson, female    DOB: 08/08/1971  Age: 45 y.o. 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 R wrist pain: x 3 weeks. She stopped working at [employer] and started working at [employer] about 3 months ago. She works in different areas. She has pain with repetitive wrist flexion. She was swelling on her ventral wrist radially. Her wrist pain radiates up the radial forearm. She had similar pain in 02/2015 that resolved after she stopped working and was abel to rest.   Social History  Substance Use Topics  . Smoking status: Current Every Day Smoker    Packs/day: 1.00    Years: 29.00    Types: Cigarettes  . Smokeless tobacco: Never Used  . Alcohol use No    Outpatient Medications Prior to Visit  Medication Sig Dispense Refill  . ibuprofen (ADVIL,MOTRIN) 200 MG tablet Take 400 mg by mouth every 6 (six) hours as needed for mild pain.    Marland Kitchen loperamide (IMODIUM) 2 MG capsule Take 1 capsule (2 mg total) by mouth 4 (four) times daily as needed for diarrhea or loose stools. 12 capsule 0  . ondansetron (ZOFRAN ODT) 4 MG disintegrating tablet Take 1 tablet (4 mg total) by mouth every 8 (eight) hours as needed for nausea or vomiting. 10 tablet 0  . methocarbamol (ROBAXIN) 500 MG tablet Take 1 tab twice daily for 7 days. (Patient not taking: Reported on 09/16/2016) 14 tablet 0  . ranitidine (ZANTAC) 150 MG tablet Take 1 tablet (150 mg total) by mouth 2 (two) times daily. (Patient not taking: Reported on 09/16/2016) 60 tablet 1   No facility-administered medications prior to visit.     ROS Review of Systems  Constitutional: Negative for chills and fever.  Eyes: Negative for visual disturbance.  Respiratory: Negative for shortness of breath.   Cardiovascular: Negative for chest pain.  Gastrointestinal: Negative for abdominal pain and blood in stool.  Musculoskeletal:  Positive for arthralgias and joint swelling. Negative for back pain.  Skin: Negative for rash.  Allergic/Immunologic: Negative for immunocompromised state.  Hematological: Negative for adenopathy. Does not bruise/bleed easily.  Psychiatric/Behavioral: Negative for dysphoric mood and suicidal ideas.    Objective:  BP 93/61 (BP Location: Left Arm, Patient Position: Sitting, Cuff Size: Small)   Pulse 86   Temp 97.9 F (36.6 C) (Oral)   Ht 5' (1.524 m)   Wt 162 lb (73.5 kg)   SpO2 98%   BMI 31.64 kg/m   BP/Weight 09/16/2016 08/11/2016 123456  Systolic BP 93 123456 90  Diastolic BP 61 84 60  Wt. (Lbs) 162 167 156.5  BMI 31.64 31.04 30.31     Physical Exam  Constitutional: She is oriented to person, place, and time. She appears well-developed and well-nourished. No distress.  HENT:  Head: Normocephalic and atraumatic.  Cardiovascular: Normal rate, regular rhythm, normal heart sounds and intact distal pulses.   Pulmonary/Chest: Effort normal and breath sounds normal.  Musculoskeletal: She exhibits no edema.       Right wrist: She exhibits decreased range of motion, tenderness, bony tenderness, swelling and deformity. She exhibits no effusion, no crepitus and no laceration.       Arms: Neurological: She is alert and oriented to person, place, and time.  Skin: Skin is warm and dry. No rash noted.  Psychiatric: She has a normal mood and affect.  Assessment & Plan:  April Hudson was seen today for hand pain.  Diagnoses and all orders for this visit:  Carpal tunnel syndrome of right wrist -     Ambulatory referral to Neurology -     methylPREDNISolone (MEDROL DOSEPAK) 4 MG TBPK tablet; Taper per packet insert -     traMADol (ULTRAM) 50 MG tablet; Take 1 tablet (50 mg total) by mouth every 6 (six) hours as needed. -     ANA,IFA RA Diag Pnl w/rflx Tit/Patn; Future -     Sedimentation Rate; Future -     C-reactive protein; Future -     Uric acid; Future  Ganglion cyst of wrist,  right -     ANA,IFA RA Diag Pnl w/rflx Tit/Patn; Future -     Sedimentation Rate; Future -     C-reactive protein; Future -     Uric acid; Future   There are no diagnoses linked to this encounter.  No orders of the defined types were placed in this encounter.   Follow-up: Return in about 4 weeks (around 10/14/2016) for Right carpal tunnel .   Boykin Nearing MD

## 2016-09-16 NOTE — Progress Notes (Signed)
Pt is here today for right hand pain.  Pt declined flu shot today.

## 2016-09-16 NOTE — Patient Instructions (Addendum)
April Hudson was seen today for hand pain.  Diagnoses and all orders for this visit:  Carpal tunnel syndrome of right wrist -     Ambulatory referral to Neurology -     methylPREDNISolone (MEDROL DOSEPAK) 4 MG TBPK tablet; Taper per packet insert -     traMADol (ULTRAM) 50 MG tablet; Take 1 tablet (50 mg total) by mouth every 6 (six) hours as needed.  Ganglion cyst of wrist, right   Ice wrist for 20 minute twice daily Wear brace at night  Avoid repetitive wrist flexion (bending)   F/u in 4 weeks for carpal tunnel   Dr. Adrian Blackwater   Carpal Tunnel Syndrome Introduction Carpal tunnel syndrome is a condition that causes pain in your hand and arm. The carpal tunnel is a narrow area that is on the palm side of your wrist. Repeated wrist motion or certain diseases may cause swelling in the tunnel. This swelling can pinch the main nerve in the wrist (median nerve). Follow these instructions at home: If you have a splint:  Wear it as told by your doctor. Remove it only as told by your doctor.  Loosen the splint if your fingers:  Become numb and tingle.  Turn blue and cold.  Keep the splint clean and dry. General instructions  Take over-the-counter and prescription medicines only as told by your doctor.  Rest your wrist from any activity that may be causing your pain. If needed, talk to your employer about changes that can be made in your work, such as getting a wrist pad to use while typing.  If directed, apply ice to the painful area:  Put ice in a plastic bag.  Place a towel between your skin and the bag.  Leave the ice on for 20 minutes, 2-3 times per day.  Keep all follow-up visits as told by your doctor. This is important.  Do any exercises as told by your doctor, physical therapist, or occupational therapist. Contact a doctor if:  You have new symptoms.  Medicine does not help your pain.  Your symptoms get worse. This information is not intended to replace advice  given to you by your health care provider. Make sure you discuss any questions you have with your health care provider. Document Released: 09/09/2011 Document Revised: 02/26/2016 Document Reviewed: 02/05/2015  2017 Elsevier

## 2016-09-19 ENCOUNTER — Telehealth: Payer: Self-pay | Admitting: Family Medicine

## 2016-09-19 NOTE — Telephone Encounter (Signed)
Pleas call patient I would like her to return for f/u labs at earliest convenience to rule out inflammatory arthritis

## 2016-09-19 NOTE — Assessment & Plan Note (Signed)
R wrist carpal tunnel recurrent with ganglion cyst Plan: Steroid taper wrsit brace Tramadol for pain Checking inflammatory markers Referral to neurology for nerve conduction testing

## 2016-09-20 NOTE — Telephone Encounter (Signed)
Pt was called and informed of needing to get follow up labs done. Pt was informed to make an appointment.

## 2016-09-21 ENCOUNTER — Ambulatory Visit: Payer: Self-pay | Attending: Family Medicine

## 2016-09-21 DIAGNOSIS — G5601 Carpal tunnel syndrome, right upper limb: Secondary | ICD-10-CM | POA: Insufficient documentation

## 2016-09-21 DIAGNOSIS — M67431 Ganglion, right wrist: Secondary | ICD-10-CM | POA: Insufficient documentation

## 2016-09-21 DIAGNOSIS — R1032 Left lower quadrant pain: Secondary | ICD-10-CM | POA: Insufficient documentation

## 2016-09-21 LAB — CBC
HCT: 42.2 % (ref 35.0–45.0)
HEMOGLOBIN: 14.4 g/dL (ref 11.7–15.5)
MCH: 32.3 pg (ref 27.0–33.0)
MCHC: 34.1 g/dL (ref 32.0–36.0)
MCV: 94.6 fL (ref 80.0–100.0)
MPV: 9.2 fL (ref 7.5–12.5)
PLATELETS: 208 10*3/uL (ref 140–400)
RBC: 4.46 MIL/uL (ref 3.80–5.10)
RDW: 14.5 % (ref 11.0–15.0)
WBC: 8.4 10*3/uL (ref 3.8–10.8)

## 2016-09-21 NOTE — Progress Notes (Signed)
Patient here for lab visit only 

## 2016-09-22 ENCOUNTER — Ambulatory Visit (AMBULATORY_SURGERY_CENTER): Payer: Self-pay | Admitting: *Deleted

## 2016-09-22 ENCOUNTER — Telehealth: Payer: Self-pay | Admitting: Family Medicine

## 2016-09-22 VITALS — Ht 60.0 in | Wt 159.0 lb

## 2016-09-22 DIAGNOSIS — Z86010 Personal history of colonic polyps: Secondary | ICD-10-CM

## 2016-09-22 LAB — URIC ACID: Uric Acid, Serum: 5.2 mg/dL (ref 2.5–7.0)

## 2016-09-22 LAB — ANA,IFA RA DIAG PNL W/RFLX TIT/PATN
ANA: NEGATIVE
Cyclic Citrullin Peptide Ab: <16 Units

## 2016-09-22 LAB — C-REACTIVE PROTEIN: CRP: 0.6 mg/L (ref <8.0)

## 2016-09-22 LAB — SEDIMENTATION RATE: Sed Rate: 4 mm/hr (ref 0–20)

## 2016-09-22 NOTE — Progress Notes (Signed)
Patient denies any allergies to eggs or soy. Patient denies any problems with anesthesia/sedation. Patient denies any oxygen use at home and does not take any diet/weight loss medications. Patient declined EMMI education. No email per pt.

## 2016-09-22 NOTE — Telephone Encounter (Signed)
Will route to PCP 

## 2016-09-22 NOTE — Telephone Encounter (Signed)
Patient called and stated she needs letter for work specifying she is unable to perform her job duties. Patient sates she had conversation with pcp and pcp informed patient she would be able to write a letter for work.   Patient needs letter by 4:30pm today, she is being asked to go in to work tomorrow at [employer].

## 2016-09-23 NOTE — Telephone Encounter (Signed)
Please inform patient Letter for work written and ready for pick up

## 2016-09-24 NOTE — Telephone Encounter (Signed)
Pt was called and informed of letter being ready for pick up.

## 2016-09-29 ENCOUNTER — Emergency Department (HOSPITAL_COMMUNITY)
Admission: EM | Admit: 2016-09-29 | Discharge: 2016-09-29 | Disposition: A | Discharge status: Home / Self Care | Payer: Self-pay | Attending: Emergency Medicine | Admitting: Emergency Medicine

## 2016-09-29 ENCOUNTER — Emergency Department (HOSPITAL_COMMUNITY): Payer: Self-pay

## 2016-09-29 ENCOUNTER — Telehealth: Payer: Self-pay

## 2016-09-29 ENCOUNTER — Encounter (HOSPITAL_COMMUNITY): Payer: Self-pay | Admitting: *Deleted

## 2016-09-29 DIAGNOSIS — J45909 Unspecified asthma, uncomplicated: Secondary | ICD-10-CM | POA: Insufficient documentation

## 2016-09-29 DIAGNOSIS — Y929 Unspecified place or not applicable: Secondary | ICD-10-CM | POA: Insufficient documentation

## 2016-09-29 DIAGNOSIS — W109XXA Fall (on) (from) unspecified stairs and steps, initial encounter: Secondary | ICD-10-CM | POA: Insufficient documentation

## 2016-09-29 DIAGNOSIS — Y999 Unspecified external cause status: Secondary | ICD-10-CM | POA: Insufficient documentation

## 2016-09-29 DIAGNOSIS — F17210 Nicotine dependence, cigarettes, uncomplicated: Secondary | ICD-10-CM | POA: Insufficient documentation

## 2016-09-29 DIAGNOSIS — Y939 Activity, unspecified: Secondary | ICD-10-CM | POA: Insufficient documentation

## 2016-09-29 DIAGNOSIS — S39012A Strain of muscle, fascia and tendon of lower back, initial encounter: Secondary | ICD-10-CM | POA: Insufficient documentation

## 2016-09-29 DIAGNOSIS — Z79899 Other long term (current) drug therapy: Secondary | ICD-10-CM | POA: Insufficient documentation

## 2016-09-29 MED ORDER — ONDANSETRON HCL 4 MG/2ML IJ SOLN
4.0000 mg | Freq: Once | INTRAMUSCULAR | Status: AC
  Administered 2016-09-29: 4 mg via INTRAVENOUS
  Filled 2016-09-29: qty 2

## 2016-09-29 MED ORDER — CYCLOBENZAPRINE HCL 10 MG PO TABS: 10.0000 mg | ORAL_TABLET | Freq: Three times a day (TID) | ORAL | 0 refills | Status: DC | PRN

## 2016-09-29 MED ORDER — HYDROCODONE-ACETAMINOPHEN 5-325 MG PO TABS: 1.0000 | ORAL_TABLET | ORAL | 0 refills | Status: DC | PRN

## 2016-09-29 MED ORDER — HYDROMORPHONE HCL 2 MG/ML IJ SOLN
1.0000 mg | Freq: Once | INTRAMUSCULAR | Status: AC
Start: 2016-09-29 — End: 2016-09-29
  Administered 2016-09-29: 1 mg via INTRAVENOUS
  Filled 2016-09-29: qty 1

## 2016-09-29 NOTE — ED Triage Notes (Signed)
Patient states she fell down 5 steps on Christmas day but was out of town and waited to come here

## 2016-09-29 NOTE — Telephone Encounter (Signed)
Pt was called and informed of lab results. 

## 2016-09-29 NOTE — ED Provider Notes (Signed)
[facility] DEPT Provider Note   CSN: [id number] Arrival date & time: 09/29/16  0426     History   Chief Complaint Chief Complaint  Patient presents with  . Fall    [name] is a 45 y.o. female.  Patient presents to the ER for evaluation of back pain. Patient complaining of left lower back pain after a fall. Patient reports that the fall occurred more than one day ago, while she was out of town. She has been having sharp pains in the left lower back that worsens with movement ever since the fall. No head injury or loss of consciousness. Denies neck and upper back pain. No extremity injury. Pain is constant but significantly worsens with any movement.      Past Medical History:  Diagnosis Date  . Allergy   . Anemia    since at least 2013  . Childhood asthma   . Chronic bronchitis ([place])   . GERD (gastroesophageal reflux disease) Dx 2015  . History of frequent urinary tract infections   . History of gastric ulcer    clinical diagnosis, no prior EGD  . Joint pain   . Migraines Dx 2013   "not very often" (05/18/2016)  . Smoker     Patient Active Problem List   Diagnosis Date Noted  . Ganglion cyst of wrist, right 09/16/2016  . Esophageal reflux 05/24/2016  . Cough 05/24/2016  . Anal fissure 10/23/2015  . Epidermoid cyst 09/02/2015  . 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 Tinea pedis 05/26/2015  . Pain of left thumb 05/26/2015  . 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 De Quervain's tenosynovitis, left 04/14/2015  . Neck pain on right side 01/13/2015  . Carpal tunnel syndrome 12/30/2014  . Smoker 07/03/2014  . Family history of stomach cancer 07/03/2014    Past Surgical History:  Procedure Laterality Date  . COLONOSCOPY    . KNEE ARTHROSCOPY Left ~ 2015  . POLYPECTOMY    . VAGINAL HYSTERECTOMY  2006   for fibroids and heavy menstrual bleeding     OB History    No data available       Home Medications    Prior to  Admission medications   Medication Sig Start Date End Date Taking? Authorizing Provider  cyclobenzaprine (FLEXERIL) 10 MG tablet Take 1 tablet (10 mg total) by mouth 3 (three) times daily as needed for muscle spasms. 09/29/16   Orpah Greek, MD  HYDROcodone-acetaminophen (NORCO/VICODIN) 5-325 MG tablet Take 1 tablet by mouth every 4 (four) hours as needed for moderate pain. 09/29/16   Orpah Greek, MD  traMADol (ULTRAM) 50 MG tablet Take 1 tablet (50 mg total) by mouth every 6 (six) hours as needed. 09/16/16   Boykin Nearing, MD    Family History Family History  Problem Relation Age of Onset  . Diabetes Mother   . Hypertension Mother   . Ovarian cancer Mother   . Stomach cancer Maternal Uncle   . Heart disease Sister   . Healthy Brother   . Asthma Son   . Asthma Daughter   . Colon cancer Maternal Uncle 86  . Colon polyps Neg Hx   . Esophageal cancer Neg Hx   . Rectal cancer Neg Hx     Social History Social History  Substance Use Topics  . Smoking status: Current Every Day Smoker    Packs/day: 1.00    Years: 29.00    Types: Cigarettes  . Smokeless tobacco: Never Used  .  Alcohol use No     Allergies   Sulfa antibiotics   Review of Systems Review of Systems  Musculoskeletal: Positive for back pain.  All other systems reviewed and are negative.    Physical Exam Updated Vital Signs BP 101/72 (BP Location: Left Arm)   Pulse 113   Temp 98.3 F (36.8 C) (Oral)   Resp 18   Ht 5\' 1"  (1.549 m)   Wt 165 lb (74.8 kg)   SpO2 100%   BMI 31.18 kg/m   Physical Exam  Constitutional: She is oriented to person, place, and time. She appears well-developed and well-nourished. No distress.  HENT:  Head: Normocephalic and atraumatic.  Right Ear: Hearing normal.  Left Ear: Hearing normal.  Nose: Nose normal.  Mouth/Throat: Oropharynx is clear and moist and mucous membranes are normal.  Eyes: Conjunctivae and EOM are normal. Pupils are equal, round, and  reactive to light.  Neck: Normal range of motion. Neck supple.  Cardiovascular: Regular rhythm, S1 normal and S2 normal.  Exam reveals no gallop and no friction rub.   No murmur heard. Pulmonary/Chest: Effort normal and breath sounds normal. No respiratory distress. She exhibits no tenderness.  Abdominal: Soft. Normal appearance and bowel sounds are normal. There is no hepatosplenomegaly. There is no tenderness. There is no rebound, no guarding, no tenderness at McBurney's point and negative Murphy's sign. No hernia.  Musculoskeletal: Normal range of motion.       Lumbar back: She exhibits tenderness. She exhibits no bony tenderness.       Back:  Neurological: She is alert and oriented to person, place, and time. She has normal strength. No cranial nerve deficit or sensory deficit. Coordination normal. GCS eye subscore is 4. GCS verbal subscore is 5. GCS motor subscore is 6.  Skin: Skin is warm, dry and intact. No rash noted. No cyanosis.  Psychiatric: She has a normal mood and affect. Her speech is normal and behavior is normal. Thought content normal.  Nursing note and vitals reviewed.    ED Treatments / Results  Labs (all labs ordered are listed, but only abnormal results are displayed) Labs Reviewed - No data to display  EKG  EKG Interpretation None       Radiology No results found.  Procedures Procedures (including critical care time)  Medications Ordered in ED Medications  HYDROmorphone (DILAUDID) injection 1 mg (1 mg Intravenous Given 09/29/16 0513)  ondansetron (ZOFRAN) injection 4 mg (4 mg Intravenous Given 09/29/16 0513)     Initial Impression / Assessment and Plan / ED Course  I have reviewed the triage vital signs and the nursing notes.  Pertinent labs & imaging results that were available during my care of the patient were reviewed by me and considered in my medical decision making (see chart for details).  Clinical Course   Patient presents to the ER with  musculoskeletal back pain. Examination reveals back tenderness without any associated neurologic findings. Patient's strength, sensation and reflexes were normal. There is no evidence of saddle anesthesia. Patient does not have a foot drop. Patient has not experienced any change in bowel or bladder function. Lumbar x-ray does not show any evidence of fracture. Patient was treated with analgesia. She feels much improvement. We'll continue rest and analgesia.   Final Clinical Impressions(s) / ED Diagnoses   Final diagnoses:  Lumbar strain, initial encounter    New Prescriptions New Prescriptions   CYCLOBENZAPRINE (FLEXERIL) 10 MG TABLET    Take 1 tablet (10 mg total) by  mouth 3 (three) times daily as needed for muscle spasms.   HYDROCODONE-ACETAMINOPHEN (NORCO/VICODIN) 5-325 MG TABLET    Take 1 tablet by mouth every 4 (four) hours as needed for moderate pain.     Orpah Greek, MD 09/29/16 [phone]

## 2016-10-06 ENCOUNTER — Ambulatory Visit (AMBULATORY_SURGERY_CENTER): Payer: Self-pay | Admitting: Gastroenterology

## 2016-10-06 ENCOUNTER — Encounter: Payer: Self-pay | Admitting: Gastroenterology

## 2016-10-06 VITALS — BP 122/87 | HR 82 | Temp 95.7°F | Resp 13 | Ht 60.0 in | Wt 159.0 lb

## 2016-10-06 DIAGNOSIS — Z86010 Personal history of colonic polyps: Secondary | ICD-10-CM | POA: Diagnosis not present

## 2016-10-06 DIAGNOSIS — D128 Benign neoplasm of rectum: Secondary | ICD-10-CM

## 2016-10-06 DIAGNOSIS — K621 Rectal polyp: Secondary | ICD-10-CM

## 2016-10-06 MED ORDER — SODIUM CHLORIDE 0.9 % IV SOLN: 500.0000 mL | INTRAVENOUS | Status: DC

## 2016-10-06 NOTE — Progress Notes (Signed)
Called to room to assist during endoscopic procedure.  Patient ID and intended procedure confirmed with present staff. Received instructions for my participation in the procedure from the performing physician.  

## 2016-10-06 NOTE — Op Note (Signed)
[facility] Patient Name: April Hudson Procedure Date: 10/06/2016 8:42 AM MRN: [id number] Endoscopist: Milus Banister , MD Age: 46 Referring MD:  Date of Birth: [birth year] Gender: Female Account #: [id number] Procedure:                Flexible Sigmoidoscopy Indications:              High risk colon cancer surveillance: Personal                            history of colonic polyps; 06/2015 colonoscopy Dr.                            Ardis Hughs 4cm rectal TA with HGD (not near margin),                            flex sig 2017 with small residual/recurrent TA at                            the site Medicines:                Monitored Anesthesia Care Procedure:                Pre-Anesthesia Assessment:                           - Prior to the procedure, a History and Physical                            was performed, and patient medications and                            allergies were reviewed. The patient's tolerance of                            previous anesthesia was also reviewed. The risks                            and benefits of the procedure and the sedation                            options and risks were discussed with the patient.                            All questions were answered, and informed consent                            was obtained. Prior Anticoagulants: The patient has                            taken no previous anticoagulant or antiplatelet                            agents. ASA Grade Assessment: II - A patient with  mild systemic disease. After reviewing the risks                            and benefits, the patient was deemed in                            satisfactory condition to undergo the procedure.                           After obtaining informed consent, the scope was                            passed under direct vision. The Model PCF-H190L                            [id number]) scope was introduced through the  anus                            and advanced to the the splenic flexure. The                            flexible sigmoidoscopy was accomplished without                            difficulty. The patient tolerated the procedure                            well. The quality of the bowel preparation was                            excellent. Scope In: Scope Out: Findings:                 The site of previous polypectomies in mid rectum                            was easily located by scar and [place]. There was                            NO overtly adenomatous mucosa present. I took                            several mucosal biopsies of the site to be certain.                           The exam was otherwise without abnormality. Complications:            No immediate complications. Estimated blood loss:                            None. Estimated Blood Loss:     Estimated blood loss: none. Impression:               - The site of previous polypectomies in mid rectum  was easily located by scar and [place] Ink. There was                            NO overtly adenomatous mucosa present. I took                            several mucosal biopsies of the site to be certain.                           - The examination was otherwise normal. Recommendation:           - Patient has a contact number available for                            emergencies. The signs and symptoms of potential                            delayed complications were discussed with the                            patient. Return to normal activities tomorrow.                            Written discharge instructions were provided to the                            patient.                           - Resume previous diet.                           - Await final pathology. She will at least need a                            repeat full colonoscopy 06/2018 (3 years from her                            original  one) Milus Banister, MD 10/06/2016 9:00:49 AM This report has been signed electronically.

## 2016-10-06 NOTE — Patient Instructions (Signed)
YOU HAD AN ENDOSCOPIC PROCEDURE TODAY AT [facility]:   Refer to the procedure report that was given to you for any specific questions about what was found during the examination.  If the procedure report does not answer your questions, please call your gastroenterologist to clarify.  If you requested that your care partner not be given the details of your procedure findings, then the procedure report has been included in a sealed envelope for you to review at your convenience later.  YOU SHOULD EXPECT: Some feelings of bloating in the abdomen. Passage of more gas than usual.  Walking can help get rid of the air that was put into your GI tract during the procedure and reduce the bloating. If you had a lower endoscopy (such as a colonoscopy or flexible sigmoidoscopy) you may notice spotting of blood in your stool or on the toilet paper. If you underwent a bowel prep for your procedure, you may not have a normal bowel movement for a few days.  Please Note:  You might notice some irritation and congestion in your nose or some drainage.  This is from the oxygen used during your procedure.  There is no need for concern and it should clear up in a day or so.  SYMPTOMS TO REPORT IMMEDIATELY:   Following lower endoscopy (colonoscopy or flexible sigmoidoscopy):  Excessive amounts of blood in the stool  Significant tenderness or worsening of abdominal pains  Swelling of the abdomen that is new, acute  Fever of 100F or higher   For urgent or emergent issues, a gastroenterologist can be reached at any hour by calling [phone].   DIET:  We do recommend a small meal at first, but then you may proceed to your regular diet.  Drink plenty of fluids but you should avoid alcoholic beverages for 24 hours.  ACTIVITY:  You should plan to take it easy for the rest of today and you should NOT DRIVE or use heavy machinery until tomorrow (because of the sedation medicines used during the test).     FOLLOW UP: Our staff will call the number listed on your records the next business day following your procedure to check on you and address any questions or concerns that you may have regarding the information given to you following your procedure. If we do not reach you, we will leave a message.  However, if you are feeling well and you are not experiencing any problems, there is no need to return our call.  We will assume that you have returned to your regular daily activities without incident.  If any biopsies were taken you will be contacted by phone or by letter within the next 1-3 weeks.  Please call us at [phone] if you have not heard about the biopsies in 3 weeks.    SIGNATURES/CONFIDENTIALITY: You and/or your care partner have signed paperwork which will be entered into your electronic medical record.  These signatures attest to the fact that that the information above on your After Visit Summary has been reviewed and is understood.  Full responsibility of the confidentiality of this discharge information lies with you and/or your care-partner.68 Thank you for letting us take care of your healthcare needs today.

## 2016-10-06 NOTE — Progress Notes (Signed)
  [facility] Post-op Note  Patient: April Hudson  Procedure(s) Performed: flexible sigmoidoscopy  Patient Location: [facility] - Recovery Area  Anesthesia Type: Deep Sedation/Propofol  Level of Consciousness: awake, oriented and patient cooperative  Airway and Oxygen Therapy: Patient Spontanous Breathing  Post-op Pain: none  Post-op Assessment:  Post-op Vital signs reviewed, Patient's Cardiovascular Status Stable, Respiratory Function Stable, Patent Airway, No signs of Nausea or vomiting and Pain level controlled  Post-op Vital Signs: Reviewed and stable  Complications: No apparent anesthesia complications  Vincenza Dail E 9:01 AM

## 2016-10-07 ENCOUNTER — Telehealth: Payer: Self-pay

## 2016-10-07 NOTE — Telephone Encounter (Signed)
  Follow up Call-  Call back number 10/06/2016 03/09/2016 06/17/2015  Post procedure Call Back phone  # [phone]  Permission to leave phone message Yes Yes Yes     Patient questions:  Do you have a fever, pain , or abdominal swelling? No. Pain Score  0 *  Have you tolerated food without any problems? Yes.    Have you been able to return to your normal activities? Yes.    Do you have any questions about your discharge instructions: Diet   No. Medications  No. Follow up visit  No.  Do you have questions or concerns about your Care? No.  Actions: * If pain score is 4 or above: No action needed, pain <4.

## 2016-10-15 ENCOUNTER — Encounter: Payer: Self-pay | Admitting: Gastroenterology

## 2016-11-22 ENCOUNTER — Ambulatory Visit (INDEPENDENT_AMBULATORY_CARE_PROVIDER_SITE_OTHER): Payer: BLUE CROSS/BLUE SHIELD | Admitting: Neurology

## 2016-11-22 ENCOUNTER — Encounter: Payer: Self-pay | Admitting: Neurology

## 2016-11-22 VITALS — BP 108/68 | HR 84 | Ht 60.0 in | Wt 152.1 lb

## 2016-11-22 DIAGNOSIS — M67431 Ganglion, right wrist: Secondary | ICD-10-CM | POA: Diagnosis not present

## 2016-11-22 DIAGNOSIS — G5601 Carpal tunnel syndrome, right upper limb: Secondary | ICD-10-CM

## 2016-11-22 MED ORDER — GABAPENTIN 300 MG PO CAPS: 300.0000 mg | ORAL_CAPSULE | Freq: Three times a day (TID) | ORAL | 5 refills | Status: DC

## 2016-11-22 NOTE — Progress Notes (Signed)
Follow-up Visit   Date: 11/22/16    April Hudson MRN: [id number] DOB: 09/14/1971   Interim History: April Hudson is a 46 y.o. right-handed African American female with tobacco use returning to the clinic for follow-up of right carpal tunnel syndrome.  The patient was accompanied to the clinic by self.  History of present illness: Initial visit 03/12/2015 for bilateral hand paresthesias and neck pain:  Start around December 2015, she developed sharp pain involving bilateral thumbs and radiating into her wrists.  She has noticed mild swelling around the same area, which is worse with thumb movement or repetitive use. Pain is constant on the right hand.  She also reports having numbness over the tips of the right middle and ring finger.  Grip strength is weaker and she endorses dropping objects sometimes.  She wakes up at times with her arm asleep and tries to shake it off, which usually helps.  Because of concern of carpal tunnel syndrome, she started using a wrist splint and taking gabapenitn 300mg  twice daily, which seems to help her numbness, but does not change the wrist pain.  Aleve and ibuprofen helps her pain for a few hours but because of GI ulcers, she cannot take very much of this. She saw her PCP for these symptoms who ordered MRI cervical spine which returned normal and referred her for further evaluation.   UPDATE 11/22/2016:  Over the past two years, she has noticed tingling and numbness involving the right thumb, index finger, and middle finger.  Symptoms are constant.  Symptoms are better when she is not using her hands.  She has been using a wrist splint for the past 25-months, but has not noticed any improvement.  She has difficulty opening jars and cooking.  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  She also complains of throbbing pain at the base of her thumb  and had trial of steroid injection for DeQuervain's, but there was no improvement. Over the past several months, she has noticed a cyst come up at the right wrist, it is tender and exacerbates her paresthesias when compressed.   Medications:  Current Outpatient Prescriptions on File Prior to Visit  Medication Sig Dispense Refill  . cyclobenzaprine (FLEXERIL) 10 MG tablet Take 1 tablet (10 mg total) by mouth 3 (three) times daily as needed for muscle spasms. 20 tablet 0  . HYDROcodone-acetaminophen (NORCO/VICODIN) 5-325 MG tablet Take 1 tablet by mouth every 4 (four) hours as needed for moderate pain. 10 tablet 0  . traMADol (ULTRAM) 50 MG tablet Take 1 tablet (50 mg total) by mouth every 6 (six) hours as needed. 30 tablet 0   Current Facility-Administered Medications on File Prior to Visit  Medication Dose Route Frequency Provider Last Rate Last Dose  . 0.9 %  sodium chloride infusion  500 mL Intravenous Continuous Milus Banister, MD        Allergies:  Allergies  Allergen Reactions  . Sulfa Antibiotics Hives and Rash    Review of Systems:  CONSTITUTIONAL: No fevers, chills, night sweats, or weight loss.  EYES: No visual changes or eye pain ENT: No hearing changes.  No history of nose bleeds.   RESPIRATORY: No cough, wheezing and shortness of breath.   CARDIOVASCULAR: Negative for chest pain, and palpitations.   GI: Negative for abdominal discomfort, blood in stools or  black stools.  No recent change in bowel habits.   GU:  No history of incontinence.   MUSCLOSKELETAL: No history of joint pain or swelling.  No myalgias.   SKIN: Negative for lesions, rash, and itching.   ENDOCRINE: Negative for cold or heat intolerance, polydipsia or goiter.   PSYCH:  + depression or anxiety symptoms.   NEURO: As Above.   Vital Signs:  BP 108/68   Pulse 84   Ht 5' (1.524 m)   Wt 152 lb 1 oz (69 kg)   SpO2 100%   BMI 29.70 kg/m   Neurological Exam: MENTAL STATUS including orientation to  time, place, person, recent and remote memory, attention span and concentration, language, and fund of knowledge is normal.  Speech is not dysarthric.  CRANIAL NERVES: No visual field defects.  Pupils equal round and reactive to light.  Normal conjugate, extra-ocular eye movements in all directions of gaze.  No ptosis. Normal facial sensation.  Face is symmetric. Palate elevates symmetrically.  Tongue is midline.  MOTOR:  Motor strength is 5/5 in all extremities, except right finger abduction and ABP testing is limited due to pain.  There is no obvious atrophy of the right APB. No pronator drift.  Tone is normal.  She has achy pain with right thumb movement.  There is 1cm ganglion cyst at the right wrist.  MSRs:  Reflexes are 2+/4 throughout.  SENSORY:  Reduced temperature and pin prick over the right median distribution.  COORDINATION/GAIT:   Gait narrow based and stable.   Data: MRI cervical spine wo contrast 02/03/2015: No disc herniation or stenosis.  IMPRESSION/PLAN: 1.  Right carpal tunnel syndrome, no improvement with using a wrist splint   - Proceed with NCS/EMG to determine severity of CTS  - Start gabapentin 300mg  at bedtime and titrate to 300mg  TID (titration schedule provided)  - Continue to use wrist splint  - Referral to [facility] for management of ganglion cyst and CTS since she has failed conservative therapies 2.  Right wrist ganglion cyst  3.  Right DeQuervain's tenosynovitis causing achy thumb pain radiating into lateral forearm  The duration of this appointment visit was 30 minutes of face-to-face time with the patient.  Greater than 50% of this time was spent in counseling, explanation of diagnosis, planning of further management, and coordination of care.   Thank you for allowing me to participate in patient's care.  If I can answer any additional questions, I would be pleased to do so.    Sincerely,    Khloei Spiker K. Posey Pronto, DO

## 2016-11-22 NOTE — Patient Instructions (Addendum)
1.  Start gabapentin 300 mg tablets    Morning       Afternoon        Bedtime  Week 1                                  1 tab              Week 2 1 tab                   1 tab              Continue  1 tab          1 tab            1 tab         Call with update in 1 month, to determine further increase in medication.  If you develop increased sleepiness, stay at the lower dose.           2.  NCS/EMG of the right arm tomorrow - Tuesday, February 20th, 2018.  Please arrive 15 minutes prior to appointment.   3.  We will send a referral to [facility]

## 2016-11-23 ENCOUNTER — Encounter: Payer: Self-pay | Admitting: Neurology

## 2016-11-23 ENCOUNTER — Ambulatory Visit (INDEPENDENT_AMBULATORY_CARE_PROVIDER_SITE_OTHER): Payer: BLUE CROSS/BLUE SHIELD | Admitting: Neurology

## 2016-11-23 DIAGNOSIS — G5601 Carpal tunnel syndrome, right upper limb: Secondary | ICD-10-CM | POA: Diagnosis not present

## 2016-11-23 DIAGNOSIS — M67431 Ganglion, right wrist: Secondary | ICD-10-CM

## 2016-11-23 NOTE — Procedures (Signed)
[facility]  [address]  [place], [place] [address] Tel: [phone] Fax:  [phone] Test Date:  11/23/2016  Patient: April Hudson DOB: 1971-02-16 Physician: Narda Amber, DO  Sex: Female Height: 5\' 1"  Ref Phys: Narda Amber, DO  ID#: [id number] Temp: 36.1C Technician: Jerilynn Mages. Dean   Patient Complaints: This is a 46 year old female referred for evaluation of right hand numbness and tingling, worse the first 3 fingers.   NCV & EMG Findings: Extensive electrodiagnostic testing of the right upper extremity shows:  1. Right median sensory and motor responses show prolonged latency (3.9, 4.1 ms) with normal amplitude. Right ulnar sensory and motor responses are within normal limits.  2. There is no evidence of active or chronic motor axon loss changes affecting any of the tested muscles. Motor unit configuration and recruitment pattern is within normal limits.   Impression: Right median neuropathy at or distal to the wrist, consistent with the clinical diagnosis of carpal tunnel syndrome. Overall, these findings are moderate in degree electrically.   ___________________________ Narda Amber, DO    Nerve Conduction Studies Anti Sensory Summary Table   Stim Site NR Peak (ms) Norm Peak (ms) P-T Amp (V) Norm P-T Amp  Right Median Anti Sensory (2nd Digit)  36.1C  Wrist    3.9 <3.4 20.6 >20  Right Ulnar Anti Sensory (5th Digit)  36.1C  Wrist    2.7 <3.1 27.0 >12   Motor Summary Table   Stim Site NR Onset (ms) Norm Onset (ms) O-P Amp (mV) Norm O-P Amp Site1 Site2 Delta-0 (ms) Dist (cm) Vel (m/s) Norm Vel (m/s)  Right Median Motor (Abd Poll Brev)  36.1C  Wrist    4.1 <3.9 8.6 >6 Elbow Wrist 3.3 20.0 61 >50  Elbow    7.4  8.3         Right Ulnar Motor (Abd Dig Minimi)  36.1C  Wrist    2.6 <3.1 10.9 >7 B Elbow Wrist 2.8 17.0 61 >50  B Elbow    5.4  10.3  A Elbow B Elbow 1.5 10.0 67 >50  A Elbow    6.9  10.0          EMG   Side Muscle Ins Act Fibs  Psw Fasc Number Recrt Dur Dur. Amp Amp. Poly Poly. Comment  Right 1stDorInt Nml Nml Nml Nml Nml Nml Nml Nml Nml Nml Nml Nml N/A  Right Abd Poll Brev Nml Nml Nml Nml Nml Nml Nml Nml Nml Nml Nml Nml N/A  Right Ext Indicis Nml Nml Nml Nml Nml Nml Nml Nml Nml Nml Nml Nml N/A  Right PronatorTeres Nml Nml Nml Nml Nml Nml Nml Nml Nml Nml Nml Nml N/A  Right Biceps Nml Nml Nml Nml Nml Nml Nml Nml Nml Nml Nml Nml N/A  Right Triceps Nml Nml Nml Nml Nml Nml Nml Nml Nml Nml Nml Nml N/A  Right Deltoid Nml Nml Nml Nml Nml Nml Nml Nml Nml Nml Nml Nml N/A      Waveforms:

## 2016-12-15 ENCOUNTER — Ambulatory Visit (HOSPITAL_COMMUNITY)
Admission: EM | Admit: 2016-12-15 | Discharge: 2016-12-15 | Disposition: A | Discharge status: Home / Self Care | Payer: BLUE CROSS/BLUE SHIELD | Attending: Family Medicine | Admitting: Family Medicine

## 2016-12-15 ENCOUNTER — Encounter (HOSPITAL_COMMUNITY): Payer: Self-pay | Admitting: Family Medicine

## 2016-12-15 DIAGNOSIS — M25562 Pain in left knee: Secondary | ICD-10-CM | POA: Diagnosis not present

## 2016-12-15 DIAGNOSIS — M1712 Unilateral primary osteoarthritis, left knee: Secondary | ICD-10-CM

## 2016-12-15 MED ORDER — LIDOCAINE HCL 2 % IJ SOLN
INTRAMUSCULAR | Status: AC
  Filled 2016-12-15: qty 20

## 2016-12-15 MED ORDER — METHYLPREDNISOLONE ACETATE 40 MG/ML IJ SUSP
INTRAMUSCULAR | Status: AC
Start: 2016-12-15 — End: 2016-12-15
  Filled 2016-12-15: qty 1

## 2016-12-15 NOTE — ED Triage Notes (Signed)
Pt here for swelling to left knee. sts that she has surgery on that knee years ago and that she fell again on it in the  last snow.

## 2016-12-15 NOTE — ED Provider Notes (Signed)
[facility]    CSN: [id number] Arrival date & time: 12/15/16  1048     History   Chief Complaint Chief Complaint  Patient presents with  . Knee Pain    [name] is a 46 y.o. female with a history of osteoarthritis presenting for left knee swelling and pain.   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 Pain only modestly improved with aleve last night, still couldn't sleep due to the pain so she called out of work and presented for evaluation. Has had arthroscopy several years ago to that knee. Denies redness, warmth, fevers. No recent inury/trauma, though she relates this pain to slipping on ice and falling onto the left knee 2 months ago, though she had pain at that time which resolved.    HPI  Past Medical History:  Diagnosis Date  . Allergy   . Anemia    since at least 2013  . Childhood asthma   . Chronic bronchitis ([place])   . GERD (gastroesophageal reflux disease) Dx 2015  . History of frequent urinary tract infections   . History of gastric ulcer    clinical diagnosis, no prior EGD  . Joint pain   . Migraines Dx 2013   "not very often" (05/18/2016)  . Smoker     Patient Active Problem List   Diagnosis Date Noted  . Ganglion cyst of wrist, right 09/16/2016  . Esophageal reflux 05/24/2016  . Cough 05/24/2016  . Anal fissure 10/23/2015  . Epidermoid cyst 09/02/2015  . 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 Tinea pedis 05/26/2015  . Pain of left thumb 05/26/2015  . 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 De Quervain's tenosynovitis, left 04/14/2015  . Neck pain on right side 01/13/2015  . Carpal tunnel syndrome 12/30/2014  . Smoker 07/03/2014  . Family history of stomach cancer 07/03/2014    Past Surgical History:    Procedure Laterality Date  . COLONOSCOPY    . KNEE ARTHROSCOPY Left ~ 2015  . POLYPECTOMY    . VAGINAL HYSTERECTOMY  2006   for fibroids and heavy menstrual bleeding     OB History    No data available       Home Medications    Prior to Admission medications   Medication Sig Start Date End Date Taking? Authorizing Provider  gabapentin (NEURONTIN) 300 MG capsule Take 1 capsule (300 mg total) by mouth 3 (three) times daily. 11/22/16   Alda Berthold, DO    Family History Family History  Problem Relation Age of Onset  . Diabetes Mother   . Hypertension Mother   . Ovarian cancer Mother   . Stomach cancer Maternal Uncle   . Heart disease Sister   . Healthy Brother   . Asthma Son   . Asthma Daughter   . Colon cancer Maternal Uncle 1  . Colon polyps Neg Hx   . Esophageal cancer Neg Hx   . Rectal cancer Neg Hx     Social History Social History  Substance Use Topics  . Smoking status: Current Every Day Smoker    Packs/day: 1.00    Years: 29.00    Types: Cigarettes  . Smokeless tobacco: Never Used  . Alcohol use No     Allergies   Sulfa antibiotics  Review of Systems Review of Systems Per HPI.  Physical Exam Triage Vital Signs ED Triage Vitals  Enc Vitals Group     BP 12/15/16 1218 119/78     Pulse Rate 12/15/16 1218 77     Resp 12/15/16 1218 16     Temp 12/15/16 1218 98.1 F (36.7 C)     Temp Source 12/15/16 1218 Oral     SpO2 12/15/16 1218 99 %     Weight --      Height --      Head Circumference --      Peak Flow --      Pain Score 12/15/16 1223 8     Pain Loc --      Pain Edu? --      Excl. in [place]? --    No data found.   Updated Vital Signs BP 119/78 (BP Location: Left Arm)   Pulse 77   Temp 98.1 F (36.7 C) (Oral)   Resp 16   SpO2 99%   Visual Acuity Right Eye Distance:   Left Eye Distance:   Bilateral Distance:    Right Eye Near:   Left Eye Near:    Bilateral Near:     Physical Exam  Constitutional: She appears  well-developed and well-nourished. No distress.  Neck: Normal range of motion. Neck supple.  Cardiovascular: Normal rate and regular rhythm.   Pulmonary/Chest: Effort normal and breath sounds normal.  Musculoskeletal:  Knee: No erythem or obvious bony abnormalities. + effusion/synovitis. +medial joint line tenderness. No warmth, patellar tenderness, or condyle tenderness. ROM full in flexion and extension. Ligaments with solid consistent endpoints including ACL, PCL, LCL, MCL. Negative Mcmurray's. Non painful patellar compression. Patellar glide with crepitus. Patellar and quadriceps tendons unremarkable. Hamstring and quadriceps strength is normal.       UC Treatments / Results  Labs (all labs ordered are listed, but only abnormal results are displayed) Labs Reviewed - No data to display  EKG  EKG Interpretation None       Radiology No results found.  Procedures .Joint Aspiration/Arthrocentesis Date/Time: 12/15/2016 1:10 PM Performed by: Patrecia Pour Authorized by: Vance Gather B   Consent:    Consent obtained:  Verbal   Consent given by:  Patient   Risks discussed:  Bleeding, infection and pain   Alternatives discussed:  No treatment, alternative treatment, observation and referral Location:    Location:  Knee   Knee:  L knee Anesthesia (see MAR for exact dosages):    Anesthesia method:  None Procedure details:    Preparation: Patient was prepped and draped in usual sterile fashion     Needle gauge: 25g.   Ultrasound guidance: no     Approach:  Inferior   Aspirate amount:  None   Steroid injected: yes (40mg  solumedrol and 4ml 2% lidocaine via inferior lateral approach)     Specimen collected: no   Post-procedure details:    Dressing:  Adhesive bandage   Patient tolerance of procedure:  Tolerated well, no immediate complications   (including critical care time)  Medications Ordered in UC Medications - No data to display   Initial Impression / Assessment and  Plan / UC Course  I have reviewed the triage vital signs and the nursing notes.  Pertinent labs & imaging results that were available during my care of the patient were reviewed by me and considered in my medical decision making (see chart for details).    46 y.o. female with history of  OA and remote arthroscopy of left knee here with left knee swelling and pain in setting of prolonged standing on concrete floors. No signs or symptoms of septic joint. Not consistent with gout.  - Steroid injection today tolerated well.  - Continue NSAIDs prn - Will write out of work today and tomorrow for recovery, urged to continue activities otherwise.  - Return precautions given.  Final Clinical Impressions(s) / UC Diagnoses   Final diagnoses:  Osteoarthritis of left knee, unspecified osteoarthritis type  Acute pain of left knee    New Prescriptions New Prescriptions   No medications on file     Patrecia Pour, MD 12/15/16 1318

## 2016-12-15 NOTE — Discharge Instructions (Signed)
-   Continue aleve - The pain should get better over the next hours - to - days. After injection the pain may worsen briefly. If the joint gets red, hot, and much more painful or you notice fever, seek medical attention right away.  - Stay out of work today and tomorrow for recovery, but do not go on complete bed rest as this may worsen symptoms.

## 2016-12-30 NOTE — H&P (Signed)
April Hudson is an 46 y.o. female.   Chief Complaint: RIGHT HAND CARPAL TUNNEL SYNDROME RIGHT WRIST VOLAR GANGLION CYST  HPI: THE PATIENT HAS BEEN FOLLOWED IN THE OFFICE FOR RIGHT HAND PAIN AND NUMBNESS THAT HAS BEEN PRESENT FOR THE LAST SEVERAL YEARS WITHOUT ANY KNOWN INJURY.  THE PATIENT HAS UNDERGONE A NERVE CONDUCTION STUDY THAT REVEALED A MODERATE MEDIAN NERVE ENTRAPMENT AT THE WRIST.  SHE HAS FAILED CONSERVATIVE MEASURES.  DISCUSSED SURGICAL INTERVENTION. DISCUSSED THE SURGICAL PROCEDURE, INCLUDING THE RISKS VERSUS BENEFITS, AND THE POST-OPERATIVE RECOVERY PERIOD.  THE PATIENT IS HERE TODAY FOR SURGERY.  Past Medical History:  Diagnosis Date  . Allergy   . Anemia    since at least 2013  . Childhood asthma   . Chronic bronchitis ([place])   . GERD (gastroesophageal reflux disease) Dx 2015  . History of frequent urinary tract infections   . History of gastric ulcer    clinical diagnosis, no prior EGD  . Joint pain   . Migraines Dx 2013   "not very often" (05/18/2016)  . Smoker     Past Surgical History:  Procedure Laterality Date  . COLONOSCOPY    . KNEE ARTHROSCOPY Left ~ 2015  . POLYPECTOMY    . VAGINAL HYSTERECTOMY  2006   for fibroids and heavy menstrual bleeding     Family History  Problem Relation Age of Onset  . Diabetes Mother   . Hypertension Mother   . Ovarian cancer Mother   . Stomach cancer Maternal Uncle   . Heart disease Sister   . Healthy Brother   . Asthma Son   . Asthma Daughter   . Colon cancer Maternal Uncle 5  . Colon polyps Neg Hx   . Esophageal cancer Neg Hx   . Rectal cancer Neg Hx    Social History:  reports that she has been smoking Cigarettes.  She has a 29.00 pack-year smoking history. She has never used smokeless tobacco. She reports that she does not drink alcohol or use drugs.  Allergies:  Allergies  Allergen Reactions  . Sulfa Antibiotics Hives and Rash    No prescriptions prior to admission.    No results found for  this or any previous visit (from the past 48 hour(s)). No results found.  ROS NO RECENT ILLNESSES OR HOSPITALIZATIONS  There were no vitals taken for this visit. Physical Exam  General Appearance:  Alert, cooperative, no distress, appears stated age  Head:  Normocephalic, without obvious abnormality, atraumatic  Eyes:  Pupils equal, conjunctiva/corneas clear,         Throat: Lips, mucosa, and tongue normal; teeth and gums normal  Neck: No visible masses     Lungs:   respirations unlabored  Chest Wall:  No tenderness or deformity  Heart:  Regular rate and rhythm,  Abdomen:   Soft, non-tender,         Extremities: On examination the right upper extremity the patient does have the volar carpal ganglion is able to palmar abduct her thumb extend her thumb extend her digits are fingertips are warm well perfused good capillary refill positive carpal tunnel compression test   Pulses: 2+ and symmetric  Skin: Skin color, texture, turgor normal, no rashes or lesions     Neurologic: Normal    Assessment RIGHT HAND CARPAL TUNNEL SYNDROME RIGHT WRIST VOLAR GANGLION CYST  Plan RIGHT HAND CARPAL TUNNEL RELEASE RIGHT WRIST VOLAR GANGLION EXCISION  R/B/A DISCUSSED WITH PT IN OFFICE.  PT VOICED UNDERSTANDING OF PLAN CONSENT SIGNED  DAY OF SURGERY PT SEEN AND EXAMINED PRIOR TO OPERATIVE PROCEDURE/DAY OF SURGERY SITE MARKED. QUESTIONS ANSWERED WILL GO HOME FOLLOWING SURGERY  WE ARE PLANNING SURGERY FOR YOUR UPPER EXTREMITY. THE RISKS AND BENEFITS OF SURGERY INCLUDE BUT NOT LIMITED TO BLEEDING INFECTION, DAMAGE TO NEARBY NERVES ARTERIES TENDONS, FAILURE OF SURGERY TO ACCOMPLISH ITS INTENDED GOALS, PERSISTENT SYMPTOMS AND NEED FOR FURTHER SURGICAL INTERVENTION. WITH THIS IN MIND WE WILL PROCEED. I HAVE DISCUSSED WITH THE PATIENT THE PRE AND POSTOPERATIVE REGIMEN AND THE DOS AND DON'TS. PT VOICED UNDERSTANDING AND INFORMED CONSENT SIGNED.   Brynda Peon 12/30/2016, 8:13 AM

## 2017-01-04 ENCOUNTER — Other Ambulatory Visit: Payer: Self-pay | Admitting: Orthopedic Surgery

## 2017-01-05 ENCOUNTER — Encounter (HOSPITAL_BASED_OUTPATIENT_CLINIC_OR_DEPARTMENT_OTHER): Payer: Self-pay | Admitting: *Deleted

## 2017-01-05 NOTE — Progress Notes (Signed)
NPO AFTER MN W/ EXCEPTION CLEAR LIQUIDS UNTIL 0800 (NO CREAM / MILK PRODUCTS).  ARRIVE AT 1145.  NEEDS HG.  WILL TAKE GABAPENTIN AM DOS W/ SIPS OF WATER.

## 2017-01-13 ENCOUNTER — Encounter (HOSPITAL_BASED_OUTPATIENT_CLINIC_OR_DEPARTMENT_OTHER): Payer: Self-pay

## 2017-01-13 ENCOUNTER — Ambulatory Visit (HOSPITAL_BASED_OUTPATIENT_CLINIC_OR_DEPARTMENT_OTHER): Payer: BLUE CROSS/BLUE SHIELD | Admitting: Anesthesiology

## 2017-01-13 ENCOUNTER — Encounter (HOSPITAL_BASED_OUTPATIENT_CLINIC_OR_DEPARTMENT_OTHER)
Admission: RE | Disposition: A | Discharge status: Home / Self Care | Payer: Self-pay | Source: Ambulatory Visit | Attending: Orthopedic Surgery

## 2017-01-13 ENCOUNTER — Ambulatory Visit (HOSPITAL_BASED_OUTPATIENT_CLINIC_OR_DEPARTMENT_OTHER)
Admission: RE | Admit: 2017-01-13 | Discharge: 2017-01-13 | Disposition: A | Discharge status: Home / Self Care | Payer: BLUE CROSS/BLUE SHIELD | Source: Ambulatory Visit | Attending: Orthopedic Surgery | Admitting: Orthopedic Surgery

## 2017-01-13 DIAGNOSIS — G43909 Migraine, unspecified, not intractable, without status migrainosus: Secondary | ICD-10-CM | POA: Insufficient documentation

## 2017-01-13 DIAGNOSIS — Z833 Family history of diabetes mellitus: Secondary | ICD-10-CM | POA: Insufficient documentation

## 2017-01-13 DIAGNOSIS — G5601 Carpal tunnel syndrome, right upper limb: Secondary | ICD-10-CM | POA: Diagnosis present

## 2017-01-13 DIAGNOSIS — M67431 Ganglion, right wrist: Secondary | ICD-10-CM | POA: Diagnosis not present

## 2017-01-13 DIAGNOSIS — Z8719 Personal history of other diseases of the digestive system: Secondary | ICD-10-CM | POA: Diagnosis not present

## 2017-01-13 DIAGNOSIS — Z8249 Family history of ischemic heart disease and other diseases of the circulatory system: Secondary | ICD-10-CM | POA: Diagnosis not present

## 2017-01-13 DIAGNOSIS — Z8041 Family history of malignant neoplasm of ovary: Secondary | ICD-10-CM | POA: Insufficient documentation

## 2017-01-13 DIAGNOSIS — K219 Gastro-esophageal reflux disease without esophagitis: Secondary | ICD-10-CM | POA: Diagnosis not present

## 2017-01-13 DIAGNOSIS — F17210 Nicotine dependence, cigarettes, uncomplicated: Secondary | ICD-10-CM | POA: Insufficient documentation

## 2017-01-13 DIAGNOSIS — D649 Anemia, unspecified: Secondary | ICD-10-CM | POA: Insufficient documentation

## 2017-01-13 DIAGNOSIS — Z800 Family history of malignant neoplasm of digestive organs: Secondary | ICD-10-CM | POA: Insufficient documentation

## 2017-01-13 DIAGNOSIS — J40 Bronchitis, not specified as acute or chronic: Secondary | ICD-10-CM | POA: Insufficient documentation

## 2017-01-13 DIAGNOSIS — Z87440 Personal history of urinary (tract) infections: Secondary | ICD-10-CM | POA: Insufficient documentation

## 2017-01-13 DIAGNOSIS — Z825 Family history of asthma and other chronic lower respiratory diseases: Secondary | ICD-10-CM | POA: Insufficient documentation

## 2017-01-13 DIAGNOSIS — Z90710 Acquired absence of both cervix and uterus: Secondary | ICD-10-CM | POA: Insufficient documentation

## 2017-01-13 HISTORY — DX: Personal history of colonic polyps: Z86.010

## 2017-01-13 HISTORY — DX: Personal history of adenomatous and serrated colon polyps: Z86.0101

## 2017-01-13 HISTORY — DX: Ganglion, right wrist: M67.431

## 2017-01-13 HISTORY — PX: GANGLION CYST EXCISION: SHX1691

## 2017-01-13 HISTORY — DX: Personal history of other infectious and parasitic diseases: Z86.19

## 2017-01-13 HISTORY — PX: CARPAL TUNNEL RELEASE: SHX101

## 2017-01-13 HISTORY — DX: Carpal tunnel syndrome, right upper limb: G56.01

## 2017-01-13 HISTORY — DX: Personal history of other benign neoplasm: Z86.018

## 2017-01-13 HISTORY — DX: Unspecified osteoarthritis, unspecified site: M19.90

## 2017-01-13 LAB — POCT HEMOGLOBIN-HEMACUE: HEMOGLOBIN: 15.8 g/dL — AB (ref 12.0–15.0)

## 2017-01-13 SURGERY — CARPAL TUNNEL RELEASE
Anesthesia: General | Site: Hand | Laterality: Right

## 2017-01-13 MED ORDER — LIDOCAINE HCL (CARDIAC) 20 MG/ML IV SOLN
INTRAVENOUS | Status: DC | PRN
  Administered 2017-01-13: 80 mg via INTRAVENOUS

## 2017-01-13 MED ORDER — CEFAZOLIN SODIUM-DEXTROSE 2-4 GM/100ML-% IV SOLN
2.0000 g | INTRAVENOUS | Status: AC
  Administered 2017-01-13: 2 g via INTRAVENOUS
  Filled 2017-01-13: qty 100

## 2017-01-13 MED ORDER — CHLORHEXIDINE GLUCONATE 4 % EX LIQD
60.0000 mL | Freq: Once | CUTANEOUS | Status: DC
  Filled 2017-01-13: qty 118

## 2017-01-13 MED ORDER — CEFAZOLIN SODIUM-DEXTROSE 2-4 GM/100ML-% IV SOLN
INTRAVENOUS | Status: AC
  Filled 2017-01-13: qty 100

## 2017-01-13 MED ORDER — LIDOCAINE HCL 1 % IJ SOLN
INTRAMUSCULAR | Status: AC
  Filled 2017-01-13: qty 40

## 2017-01-13 MED ORDER — OXYCODONE-ACETAMINOPHEN 5-325 MG PO TABS
ORAL_TABLET | ORAL | Status: AC
  Filled 2017-01-13: qty 1

## 2017-01-13 MED ORDER — FENTANYL CITRATE (PF) 100 MCG/2ML IJ SOLN
INTRAMUSCULAR | Status: DC | PRN
  Administered 2017-01-13 (×2): 50 ug via INTRAVENOUS
  Administered 2017-01-13: 25 ug via INTRAVENOUS
  Administered 2017-01-13: 50 ug via INTRAVENOUS
  Administered 2017-01-13: 25 ug via INTRAVENOUS

## 2017-01-13 MED ORDER — EPHEDRINE 5 MG/ML INJ
INTRAVENOUS | Status: AC
  Filled 2017-01-13: qty 10

## 2017-01-13 MED ORDER — PROPOFOL 10 MG/ML IV BOLUS
INTRAVENOUS | Status: DC | PRN
Start: 2017-01-13 — End: 2017-01-13
  Administered 2017-01-13: 200 mg via INTRAVENOUS

## 2017-01-13 MED ORDER — LIDOCAINE 2% (20 MG/ML) 5 ML SYRINGE
INTRAMUSCULAR | Status: AC
  Filled 2017-01-13: qty 5

## 2017-01-13 MED ORDER — FENTANYL CITRATE (PF) 100 MCG/2ML IJ SOLN
INTRAMUSCULAR | Status: AC
  Filled 2017-01-13: qty 2

## 2017-01-13 MED ORDER — BUPIVACAINE HCL (PF) 0.5 % IJ SOLN
INTRAMUSCULAR | Status: AC
  Filled 2017-01-13: qty 30

## 2017-01-13 MED ORDER — LACTATED RINGERS IV SOLN
INTRAVENOUS | Status: DC
  Filled 2017-01-13: qty 1000

## 2017-01-13 MED ORDER — OXYCODONE-ACETAMINOPHEN 5-325 MG PO TABS
1.0000 | ORAL_TABLET | ORAL | Status: DC | PRN
  Administered 2017-01-13: 1 via ORAL
  Filled 2017-01-13: qty 1

## 2017-01-13 MED ORDER — ONDANSETRON HCL 4 MG/2ML IJ SOLN
INTRAMUSCULAR | Status: DC | PRN
  Administered 2017-01-13: 4 mg via INTRAVENOUS

## 2017-01-13 MED ORDER — MEPERIDINE HCL 25 MG/ML IJ SOLN
6.2500 mg | INTRAMUSCULAR | Status: DC | PRN
  Filled 2017-01-13: qty 1

## 2017-01-13 MED ORDER — FENTANYL CITRATE (PF) 100 MCG/2ML IJ SOLN
25.0000 ug | INTRAMUSCULAR | Status: DC | PRN
  Administered 2017-01-13 (×2): 25 ug via INTRAVENOUS
  Filled 2017-01-13: qty 1

## 2017-01-13 MED ORDER — ONDANSETRON HCL 4 MG/2ML IJ SOLN
INTRAMUSCULAR | Status: AC
  Filled 2017-01-13: qty 2

## 2017-01-13 MED ORDER — METOCLOPRAMIDE HCL 5 MG/ML IJ SOLN
10.0000 mg | Freq: Once | INTRAMUSCULAR | Status: DC | PRN
  Filled 2017-01-13: qty 2

## 2017-01-13 MED ORDER — MIDAZOLAM HCL 2 MG/2ML IJ SOLN
INTRAMUSCULAR | Status: AC
  Filled 2017-01-13: qty 2

## 2017-01-13 MED ORDER — DOCUSATE SODIUM 100 MG PO CAPS: 100.0000 mg | ORAL_CAPSULE | Freq: Two times a day (BID) | ORAL | 0 refills | Status: DC

## 2017-01-13 MED ORDER — MIDAZOLAM HCL 5 MG/5ML IJ SOLN
INTRAMUSCULAR | Status: DC | PRN
  Administered 2017-01-13: 2 mg via INTRAVENOUS

## 2017-01-13 MED ORDER — OXYCODONE-ACETAMINOPHEN 5-325 MG PO TABS: 1.0000 | ORAL_TABLET | Freq: Three times a day (TID) | ORAL | 0 refills | Status: AC

## 2017-01-13 MED ORDER — LACTATED RINGERS IV SOLN
INTRAVENOUS | Status: DC
  Administered 2017-01-13: 12:00:00 via INTRAVENOUS
  Filled 2017-01-13: qty 1000

## 2017-01-13 MED ORDER — EPHEDRINE SULFATE 50 MG/ML IJ SOLN
INTRAMUSCULAR | Status: DC | PRN
  Administered 2017-01-13: 10 mg via INTRAVENOUS

## 2017-01-13 MED ORDER — BUPIVACAINE HCL (PF) 0.25 % IJ SOLN
INTRAMUSCULAR | Status: AC
  Filled 2017-01-13: qty 30

## 2017-01-13 MED ORDER — KETOROLAC TROMETHAMINE 30 MG/ML IJ SOLN
INTRAMUSCULAR | Status: DC | PRN
  Administered 2017-01-13: 30 mg via INTRAVENOUS

## 2017-01-13 MED ORDER — PROPOFOL 10 MG/ML IV BOLUS
INTRAVENOUS | Status: AC
  Filled 2017-01-13: qty 40

## 2017-01-13 MED ORDER — DEXAMETHASONE SODIUM PHOSPHATE 4 MG/ML IJ SOLN
INTRAMUSCULAR | Status: DC | PRN
  Administered 2017-01-13: 10 mg via INTRAVENOUS

## 2017-01-13 MED ORDER — DEXAMETHASONE SODIUM PHOSPHATE 10 MG/ML IJ SOLN
INTRAMUSCULAR | Status: AC
  Filled 2017-01-13: qty 1

## 2017-01-13 MED ORDER — KETOROLAC TROMETHAMINE 30 MG/ML IJ SOLN
INTRAMUSCULAR | Status: AC
  Filled 2017-01-13: qty 1

## 2017-01-13 MED ORDER — BUPIVACAINE HCL (PF) 0.25 % IJ SOLN
INTRAMUSCULAR | Status: DC | PRN
  Administered 2017-01-13: 10 mL

## 2017-01-13 SURGICAL SUPPLY — 44 items
BANDAGE ACE 4X5 VEL STRL LF (GAUZE/BANDAGES/DRESSINGS) ×3 IMPLANT
BANDAGE ELASTIC 3 VELCRO ST LF (GAUZE/BANDAGES/DRESSINGS) ×3 IMPLANT
BLADE SURG 15 STRL LF DISP TIS (BLADE) ×1 IMPLANT
BLADE SURG 15 STRL SS (BLADE) ×2
BNDG CONFORM 3 STRL LF (GAUZE/BANDAGES/DRESSINGS) ×3 IMPLANT
BNDG ESMARK 4X9 LF (GAUZE/BANDAGES/DRESSINGS) ×3 IMPLANT
CORDS BIPOLAR (ELECTRODE) ×3 IMPLANT
COVER BACK TABLE 60X90IN (DRAPES) ×3 IMPLANT
CUFF TOURNIQUET SINGLE 18IN (TOURNIQUET CUFF) IMPLANT
DECANTER SPIKE VIAL GLASS SM (MISCELLANEOUS) ×3 IMPLANT
DRAPE EXTREMITY T 121X128X90 (DRAPE) ×3 IMPLANT
DRAPE LG THREE QUARTER DISP (DRAPES) ×3 IMPLANT
DRAPE SURG 17X23 STRL (DRAPES) ×3 IMPLANT
DRAPE U 60X70 (DRAPES) ×3 IMPLANT
DRSG EMULSION OIL 3X3 NADH (GAUZE/BANDAGES/DRESSINGS) IMPLANT
GAUZE XEROFORM 1X8 LF (GAUZE/BANDAGES/DRESSINGS) ×3 IMPLANT
GLOVE BIO SURGEON STRL SZ 6.5 (GLOVE) IMPLANT
GLOVE BIO SURGEON STRL SZ8 (GLOVE) ×3 IMPLANT
GLOVE BIO SURGEONS STRL SZ 6.5 (GLOVE)
GLOVE BIOGEL PI IND STRL 7.0 (GLOVE) IMPLANT
GLOVE BIOGEL PI IND STRL 8.5 (GLOVE) ×1 IMPLANT
GLOVE BIOGEL PI INDICATOR 7.0 (GLOVE)
GLOVE BIOGEL PI INDICATOR 8.5 (GLOVE) ×2
GOWN STRL REUS W/ TWL LRG LVL3 (GOWN DISPOSABLE) ×1 IMPLANT
GOWN STRL REUS W/TWL LRG LVL3 (GOWN DISPOSABLE) ×2
KIT RM TURNOVER CYSTO AR (KITS) ×3 IMPLANT
NDL SAFETY ECLIPSE 18X1.5 (NEEDLE) IMPLANT
NEEDLE HYPO 18GX1.5 SHARP (NEEDLE)
NEEDLE HYPO 25X1 1.5 SAFETY (NEEDLE) ×6 IMPLANT
NS IRRIG 500ML POUR BTL (IV SOLUTION) ×3 IMPLANT
PACK BASIN DAY SURGERY FS (CUSTOM PROCEDURE TRAY) ×3 IMPLANT
PAD ALCOHOL SWAB (MISCELLANEOUS) ×12 IMPLANT
PAD CAST 3X4 CTTN HI CHSV (CAST SUPPLIES) ×1 IMPLANT
PADDING CAST COTTON 3X4 STRL (CAST SUPPLIES) ×2
SPONGE GAUZE 4X4 12PLY STER LF (GAUZE/BANDAGES/DRESSINGS) ×3 IMPLANT
STOCKINETTE 4X48 STRL (DRAPES) ×3 IMPLANT
SUT PROLENE 4 0 PS 2 18 (SUTURE) ×6 IMPLANT
SYR BULB 3OZ (MISCELLANEOUS) IMPLANT
SYR CONTROL 10ML LL (SYRINGE) ×6 IMPLANT
TOWEL OR 17X24 6PK STRL BLUE (TOWEL DISPOSABLE) ×3 IMPLANT
TRAY DSU PREP LF (CUSTOM PROCEDURE TRAY) ×6 IMPLANT
TUBE CONNECTING 12'X1/4 (SUCTIONS)
TUBE CONNECTING 12X1/4 (SUCTIONS) IMPLANT
UNDERPAD 30X30 INCONTINENT (UNDERPADS AND DIAPERS) ×3 IMPLANT

## 2017-01-13 NOTE — Op Note (Signed)
PREOPERATIVE DIAGNOSIS: Right wrist carpal tunnel syndrome  Right wrist volar carpal ganglion  POSTOPERATIVE DIAGNOSIS: Same   ATTENDING SURGEON: Dr. Gavin Pound who was scrubbed and present for the entire procedure   ASSISTANT SURGEON: none  ANESTHESIA: Gen. via LMA  OPERATIVE PROCEDURE: #1. Right hand carpal tunnel release  #2. Right wrist volar carpal ganglion removal  IMPLANTS: none  SPECIMENS: none  RADIOGRAPHIC INTERPRETATION: not applicable  SURGICAL INDICATIONS:  April Hudson is a right-hand-dominant female who had signs assistance of a right hand volar carpal ganglion as well as median nerve compression at the level of the wrist. Patient elected undergo the above procedure. Risks benefits and alternatives discussed in detail with the patient in a signed informed consent was obtained. Risks include but not limited to bleeding infection damage to nearby nerves arteries or tendons loss of motion of wrists and digits incomplete relief of symptoms and need for further surgical intervention.  SURGICAL TECHNIQUE:  Patient was properly identified in the preoperative holding area marked with a permanent marker made on the right wrist indicated correct operative site. Patient and brought back to the operating room placed supine on anesthesia and table where general anesthesia was administered. A well-padded tourniquet was then placed on the right brachium and sealed with a 1000 drape. Preoperative antibiotics were given prior to any skin incision. The right upper extremity was then prepped and draped in normal sterile fashion. A timeout was called the correct site was identified and the procedure was then begun. Attention was then turned to the right wrist. Several centimeter incision made directly in the mid palm. The limited bit elevated tourniquet insufflated. Dissection carried down through the skin subtendinous tissue. The palmar fascia was incised longitudinally. Direct exposure the  transverse carpal ligament was then carried out and under direct visualization the distal one half of the transverse carpal ligament released with a 15 blade. Further exposure was then carried out proximally with the remaining portions the transverse carpal ligament as well as portion the antebrachial fascia was released. The contents of the carpal canal were then inspected and no other at amount his were noted. The patient tolerated this procedure well. The wound was irrigated and the skin was then closed using simple 4-0 Prolene sutures.  Through separate incision directly over the FCR sheath. A longitudinal incision made directly of the FCR sheath. Dissection was then carried down through the skin and subcutaneous subcutaneous tissue. Deep dissection carried down to release the FCR sheath with a volar carpal ganglion was arising from the flexor sheath. Volar carpal ganglion was then excised. The wound was then thoroughly irrigated. The  stalk was traced all the way down to the base and cauterized. The wound was irrigated. Skin was then closed using 4-0 Prolene sutures. Xeroform dressings sterile compressive bandage was then applied. The patient tolerated the procedure well was then placed in a well-padded volar splint accident taken recovery room in good condition.  POSTOPERATIVE PLAN: Patient is to be discharged to home seen back in the office in approximately 2 weeks for wound check suture removal prescription for short arm brace and gradual use and activity. No radiographs the first visit.

## 2017-01-13 NOTE — Anesthesia Procedure Notes (Signed)
Procedure Name: LMA Insertion Date/Time: 01/13/2017 1:13 PM Performed by: Montez Hageman Pre-anesthesia Checklist: Patient identified, Emergency Drugs available, Suction available and Patient being monitored Patient Re-evaluated:Patient Re-evaluated prior to inductionOxygen Delivery Method: Circle system utilized Preoxygenation: Pre-oxygenation with 100% oxygen Intubation Type: IV induction Ventilation: Mask ventilation without difficulty LMA: LMA inserted LMA Size: 4.0 Number of attempts: 1 Airway Equipment and Method: Bite block Placement Confirmation: positive ETCO2 Tube secured with: Tape Dental Injury: Teeth and Oropharynx as per pre-operative assessment

## 2017-01-13 NOTE — Transfer of Care (Signed)
  Last Vitals:  Vitals:   01/13/17 1119 01/13/17 1405  BP: 110/67 134/79  Pulse: 79 86  Resp: 16 10  Temp: 36.9 C (P) 36.6 C    Last Pain:  Vitals:   01/13/17 1135  TempSrc:   PainSc: 8       Patients Stated Pain Goal: 9 (01/13/17 1135)  Immediate Anesthesia Transfer of Care Note  Patient: April Hudson  Procedure(s) Performed: Procedure(s) (LRB): CARPAL TUNNEL RELEASE (Right) REMOVAL GANGLION OF WRIST (Right)  Patient Location: PACU  Anesthesia Type: General  Level of Consciousness: awake, alert  and oriented  Airway & Oxygen Therapy: Patient Spontanous Breathing and Patient connected to nasal cannula oxygen  Post-op Assessment: Report given to PACU RN and Post -op Vital signs reviewed and stable  Post vital signs: Reviewed and stable  Complications: No apparent anesthesia complications

## 2017-01-13 NOTE — Anesthesia Preprocedure Evaluation (Signed)
Anesthesia Evaluation  Patient identified by MRN, date of birth, ID band Patient awake    Reviewed: Allergy & Precautions, NPO status , Patient's Chart, lab work & pertinent test results  Airway Mallampati: II  TM Distance: >3 FB Neck ROM: Full    Dental no notable dental hx.    Pulmonary Current Smoker,    Pulmonary exam normal breath sounds clear to auscultation       Cardiovascular negative cardio ROS Normal cardiovascular exam Rhythm:Regular Rate:Normal     Neuro/Psych negative neurological ROS  negative psych ROS   GI/Hepatic negative GI ROS, Neg liver ROS,   Endo/Other  negative endocrine ROS  Renal/GU negative Renal ROS  negative genitourinary   Musculoskeletal negative musculoskeletal ROS (+)   Abdominal   Peds negative pediatric ROS (+)  Hematology negative hematology ROS (+)   Anesthesia Other Findings   Reproductive/Obstetrics negative OB ROS                            Anesthesia Physical Anesthesia Plan  ASA: II  Anesthesia Plan: General   Post-op Pain Management:    Induction: Intravenous  Airway Management Planned: LMA  Additional Equipment:   Intra-op Plan:   Post-operative Plan: Extubation in OR  Informed Consent: I have reviewed the patients History and Physical, chart, labs and discussed the procedure including the risks, benefits and alternatives for the proposed anesthesia with the patient or authorized representative who has indicated his/her understanding and acceptance.   Dental advisory given  Plan Discussed with: CRNA  Anesthesia Plan Comments:         Anesthesia Quick Evaluation

## 2017-01-13 NOTE — Discharge Instructions (Signed)
Call your surgeon if you experience:   1.  Fever over 101.0. 2.  Inability to urinate. 3.  Nausea and/or vomiting. 4.  Extreme swelling or bruising at the surgical site. 5.  Continued bleeding from the incision. 6.  Increased pain, redness or drainage from the incision. 7.  Problems related to your pain medication. 8.  Any problems and/or concerns   Post Anesthesia Home Care Instructions  Activity: Get plenty of rest for the remainder of the day. A responsible individual must stay with you for 24 hours following the procedure.  For the next 24 hours, DO NOT: -Drive a car -Paediatric nurse -Drink alcoholic beverages -Take any medication unless instructed by your physician -Make any legal decisions or sign important papers.  Meals: Start with liquid foods such as gelatin or soup. Progress to regular foods as tolerated. Avoid greasy, spicy, heavy foods. If nausea and/or vomiting occur, drink only clear liquids until the nausea and/or vomiting subsides. Call your physician if vomiting continues.  Special Instructions/Symptoms: Your throat may feel dry or sore from the anesthesia or the breathing tube placed in your throat during surgery. If this causes discomfort, gargle with warm salt water. The discomfort should disappear within 24 hours.  If you had a scopolamine patch placed behind your ear for the management of post- operative nausea and/or vomiting:  1. The medication in the patch is effective for 72 hours, after which it should be removed.  Wrap patch in a tissue and discard in the trash. Wash hands thoroughly with soap and water. 2. You may remove the patch earlier than 72 hours if you experience unpleasant side effects which may include dry mouth, dizziness or visual disturbances. 3. Avoid touching the patch. Wash your hands with soap and water after contact with the patch.    Post Anesthesia Home Care Instructions  Activity: Get plenty of rest for the remainder of the  day. A responsible individual must stay with you for 24 hours following the procedure.  For the next 24 hours, DO NOT: -Drive a car -Paediatric nurse -Drink alcoholic beverages -Take any medication unless instructed by your physician -Make any legal decisions or sign important papers.  Meals: Start with liquid foods such as gelatin or soup. Progress to regular foods as tolerated. Avoid greasy, spicy, heavy foods. If nausea and/or vomiting occur, drink only clear liquids until the nausea and/or vomiting subsides. Call your physician if vomiting continues.  Special Instructions/Symptoms: Your throat may feel dry or sore from the anesthesia or the breathing tube placed in your throat during surgery. If this causes discomfort, gargle with warm salt water. The discomfort should disappear within 24 hours.  If you had a scopolamine patch placed behind your ear for the management of post- operative nausea and/or vomiting:  1. The medication in the patch is effective for 72 hours, after which it should be removed.  Wrap patch in a tissue and discard in the trash. Wash hands thoroughly with soap and water. 2. You may remove the patch earlier than 72 hours if you experience unpleasant side effects which may include dry mouth, dizziness or visual disturbances. 3. Avoid touching the patch. Wash your hands with soap and water after contact with the patch.   KEEP BANDAGE CLEAN AND DRY CALL OFFICE FOR F/U APPT [phone] in 14 days KEEP HAND ELEVATED ABOVE HEART OK TO APPLY ICE TO OPERATIVE AREA CONTACT OFFICE IF ANY WORSENING PAIN OR CONCERNS.

## 2017-01-14 ENCOUNTER — Encounter (HOSPITAL_BASED_OUTPATIENT_CLINIC_OR_DEPARTMENT_OTHER): Payer: Self-pay | Admitting: Orthopedic Surgery

## 2017-01-14 NOTE — Anesthesia Postprocedure Evaluation (Signed)
Anesthesia Post Note  Patient: Cherene Dobbins  Procedure(s) Performed: Procedure(s) (LRB): CARPAL TUNNEL RELEASE (Right) REMOVAL GANGLION OF WRIST (Right)  Patient location during evaluation: PACU Anesthesia Type: General Level of consciousness: awake and alert Pain management: pain level controlled Vital Signs Assessment: post-procedure vital signs reviewed and stable Respiratory status: spontaneous breathing, nonlabored ventilation, respiratory function stable and patient connected to nasal cannula oxygen Cardiovascular status: blood pressure returned to baseline and stable Postop Assessment: no signs of nausea or vomiting Anesthetic complications: no        Last Vitals:  Vitals:   01/13/17 1430 01/13/17 1523  BP: 116/67 107/63  Pulse: 90 69  Resp: 14 14  Temp:  36.3 C    Last Pain:  Vitals:   01/14/17 1405  TempSrc:   PainSc: 7    Pain Goal: Patients Stated Pain Goal: 9 (01/13/17 1135)               Montez Hageman

## 2017-02-16 ENCOUNTER — Encounter: Payer: Self-pay | Admitting: Family Medicine

## 2017-02-26 ENCOUNTER — Encounter (HOSPITAL_COMMUNITY): Payer: Self-pay | Admitting: Emergency Medicine

## 2017-02-26 ENCOUNTER — Emergency Department (HOSPITAL_COMMUNITY)
Admission: EM | Admit: 2017-02-26 | Discharge: 2017-02-26 | Disposition: A | Discharge status: Home / Self Care | Payer: BLUE CROSS/BLUE SHIELD | Attending: Emergency Medicine | Admitting: Emergency Medicine

## 2017-02-26 DIAGNOSIS — R109 Unspecified abdominal pain: Secondary | ICD-10-CM | POA: Diagnosis present

## 2017-02-26 DIAGNOSIS — F17210 Nicotine dependence, cigarettes, uncomplicated: Secondary | ICD-10-CM | POA: Insufficient documentation

## 2017-02-26 DIAGNOSIS — N3001 Acute cystitis with hematuria: Secondary | ICD-10-CM | POA: Diagnosis not present

## 2017-02-26 LAB — CBC
HEMATOCRIT: 40.3 % (ref 36.0–46.0)
Hemoglobin: 13.7 g/dL (ref 12.0–15.0)
MCH: 31.6 pg (ref 26.0–34.0)
MCHC: 34 g/dL (ref 30.0–36.0)
MCV: 93.1 fL (ref 78.0–100.0)
PLATELETS: 176 10*3/uL (ref 150–400)
RBC: 4.33 MIL/uL (ref 3.87–5.11)
RDW: 13.3 % (ref 11.5–15.5)
WBC: 9.5 10*3/uL (ref 4.0–10.5)

## 2017-02-26 LAB — COMPREHENSIVE METABOLIC PANEL
ALT: 9 U/L — AB (ref 14–54)
AST: 17 U/L (ref 15–41)
Albumin: 3.7 g/dL (ref 3.5–5.0)
Alkaline Phosphatase: 56 U/L (ref 38–126)
Anion gap: 8 (ref 5–15)
BUN: 7 mg/dL (ref 6–20)
CHLORIDE: 104 mmol/L (ref 101–111)
CO2: 23 mmol/L (ref 22–32)
CREATININE: 0.79 mg/dL (ref 0.44–1.00)
Calcium: 8.8 mg/dL — ABNORMAL LOW (ref 8.9–10.3)
Glucose, Bld: 100 mg/dL — ABNORMAL HIGH (ref 65–99)
POTASSIUM: 3.4 mmol/L — AB (ref 3.5–5.1)
SODIUM: 135 mmol/L (ref 135–145)
Total Bilirubin: 0.5 mg/dL (ref 0.3–1.2)
Total Protein: 6.6 g/dL (ref 6.5–8.1)

## 2017-02-26 LAB — URINALYSIS, ROUTINE W REFLEX MICROSCOPIC
BILIRUBIN URINE: NEGATIVE
Glucose, UA: NEGATIVE mg/dL
KETONES UR: NEGATIVE mg/dL
Nitrite: NEGATIVE
PH: 5 (ref 5.0–8.0)
PROTEIN: NEGATIVE mg/dL
Specific Gravity, Urine: 1.015 (ref 1.005–1.030)
WBC UA: NONE SEEN WBC/hpf (ref 0–5)

## 2017-02-26 LAB — LIPASE, BLOOD: LIPASE: 42 U/L (ref 11–51)

## 2017-02-26 MED ORDER — PHENAZOPYRIDINE HCL 100 MG PO TABS
200.0000 mg | ORAL_TABLET | Freq: Once | ORAL | Status: AC
  Administered 2017-02-26: 200 mg via ORAL
  Filled 2017-02-26: qty 2

## 2017-02-26 MED ORDER — CEPHALEXIN 250 MG PO CAPS
500.0000 mg | ORAL_CAPSULE | Freq: Once | ORAL | Status: AC
  Administered 2017-02-26: 500 mg via ORAL
  Filled 2017-02-26: qty 2

## 2017-02-26 MED ORDER — PHENAZOPYRIDINE HCL 200 MG PO TABS: 200.0000 mg | ORAL_TABLET | Freq: Three times a day (TID) | ORAL | 0 refills | Status: DC

## 2017-02-26 MED ORDER — CEPHALEXIN 500 MG PO CAPS: 500.0000 mg | ORAL_CAPSULE | Freq: Four times a day (QID) | ORAL | 0 refills | Status: DC

## 2017-02-26 NOTE — ED Triage Notes (Signed)
Pt c/o lower abdominal pain and some vaginal bleeding onset 0300 today. Pt denies N/V. Pt reports initially seen pink on tissue when she wiped but has increased to bright red in color. Bleeding only noted when she wipes. Pt has hysterectomy.

## 2017-02-26 NOTE — Discharge Instructions (Signed)
Follow up with your doctor to make sure the infection clears, return for fever, vomiting, worsening symptoms

## 2017-02-26 NOTE — ED Provider Notes (Signed)
[facility] DEPT Provider Note   CSN: [id number] Arrival date & time: 02/26/17  1330     History   Chief Complaint Chief Complaint  Patient presents with  . Abdominal Pain  . Hematuria    [name] is a 46 y.o. female.  HPI Pt went to the bathroom this am and she noticed blood when wiping.  Every time she uses the bathroom to urinate the same thing happens.  She has not noticed any blood in the vaginal area.  She only sees the blood when wiping.  She has some dysuria.  SHe has been going frequently.  No vomiting or diarrhea.  No back pain.  No fevers. Past Medical History:  Diagnosis Date  . Carpal tunnel syndrome of right wrist   . Ganglion cyst of wrist, right   . History of adenomatous polyp of colon    tubular adenoma's 09/ 2016;  06/ 2017  . History of Clostridium difficile colitis 05/21/2016  . History of frequent urinary tract infections   . History of gastric ulcer    clinical diagnosis, no prior EGD  . History of uterine fibroid   . Migraines   . OA (osteoarthritis)     Patient Active Problem List   Diagnosis Date Noted  . Ganglion cyst of wrist, right 09/16/2016  . Esophageal reflux 05/24/2016  . Cough 05/24/2016  . Anal fissure 10/23/2015  . Epidermoid cyst 09/02/2015  . 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 Tinea pedis 05/26/2015  . Pain of left thumb 05/26/2015  . 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 De Quervain's tenosynovitis, left 04/14/2015  . Neck pain on right side 01/13/2015  . Carpal tunnel syndrome 12/30/2014  . Smoker 07/03/2014  . Family history of stomach cancer 07/03/2014    Past Surgical History:  Procedure Laterality Date  . CARPAL TUNNEL RELEASE Right 01/13/2017   Procedure: CARPAL TUNNEL RELEASE;  Surgeon: Iran Planas, MD;  Location: [facility];  Service: Orthopedics;  Laterality: Right;  . COLONOSCOPY  last one 01/ 03/ 2018   sigmoid scope  . GANGLION CYST EXCISION Right  01/13/2017   Procedure: REMOVAL GANGLION OF WRIST;  Surgeon: Iran Planas, MD;  Location: [facility];  Service: Orthopedics;  Laterality: Right;  . KNEE ARTHROSCOPY Left ~ 2015  . VAGINAL HYSTERECTOMY  2006    OB History    No data available       Home Medications    Prior to Admission medications   Medication Sig Start Date End Date Taking? Authorizing Provider  cephALEXin (KEFLEX) 500 MG capsule Take 1 capsule (500 mg total) by mouth 4 (four) times daily. 02/26/17   Dorie Rank, MD  docusate sodium (COLACE) 100 MG capsule Take 1 capsule (100 mg total) by mouth 2 (two) times daily. 01/13/17   Iran Planas, MD  gabapentin (NEURONTIN) 300 MG capsule Take 1 capsule (300 mg total) by mouth 3 (three) times daily. 11/22/16   Narda Amber K, DO  ibuprofen (ADVIL,MOTRIN) 200 MG tablet Take 200 mg by mouth every 6 (six) hours as needed.    [provider]  phenazopyridine (PYRIDIUM) 200 MG tablet Take 1 tablet (200 mg total) by mouth 3 (three) times daily. 02/26/17   Dorie Rank, MD    Family History Family History  Problem Relation Age of Onset  . Diabetes Mother   . Hypertension Mother   . Ovarian cancer Mother   . Stomach cancer Maternal Uncle   . Heart  disease Sister   . Healthy Brother   . Asthma Son   . Asthma Daughter   . Colon cancer Maternal Uncle 70  . Colon polyps Neg Hx   . Esophageal cancer Neg Hx   . Rectal cancer Neg Hx     Social History Social History  Substance Use Topics  . Smoking status: Current Every Day Smoker    Packs/day: 0.50    Years: 29.00    Types: Cigarettes  . Smokeless tobacco: Never Used  . Alcohol use No     Allergies   Sulfa antibiotics   Review of Systems Review of Systems  All other systems reviewed and are negative.    Physical Exam Updated Vital Signs BP 96/67   Pulse 90   Temp 98 F (36.7 C) (Oral)   Resp 16   Ht 1.549 m (5\' 1" )   Wt 66.7 kg (147 lb)   SpO2 100%   BMI 27.78 kg/m   Physical  Exam  Constitutional: She appears well-developed and well-nourished. No distress.  HENT:  Head: Normocephalic and atraumatic.  Right Ear: External ear normal.  Left Ear: External ear normal.  Eyes: Conjunctivae are normal. Right eye exhibits no discharge. Left eye exhibits no discharge. No scleral icterus.  Neck: Neck supple. No tracheal deviation present.  Cardiovascular: Normal rate, regular rhythm and intact distal pulses.   Pulmonary/Chest: Effort normal and breath sounds normal. No stridor. No respiratory distress. She has no wheezes. She has no rales.  Abdominal: Soft. Bowel sounds are normal. She exhibits no distension. There is tenderness. There is no rebound and no guarding.  Mild suprapubic area  Musculoskeletal: She exhibits no edema or tenderness.  Neurological: She is alert. She has normal strength. No cranial nerve deficit (no facial droop, extraocular movements intact, no slurred speech) or sensory deficit. She exhibits normal muscle tone. She displays no seizure activity. Coordination normal.  Skin: Skin is warm and dry. No rash noted.  Psychiatric: She has a normal mood and affect.  Nursing note and vitals reviewed.    ED Treatments / Results  Labs (all labs ordered are listed, but only abnormal results are displayed) Labs Reviewed  COMPREHENSIVE METABOLIC PANEL - Abnormal; Notable for the following:       Result Value   Potassium 3.4 (*)    Glucose, Bld 100 (*)    Calcium 8.8 (*)    ALT 9 (*)    All other components within normal limits  URINALYSIS, ROUTINE W REFLEX MICROSCOPIC - Abnormal; Notable for the following:    APPearance HAZY (*)    Hgb urine dipstick LARGE (*)    Leukocytes, UA LARGE (*)    Bacteria, UA MANY (*)    Squamous Epithelial / LPF 0-5 (*)    All other components within normal limits  URINE CULTURE  LIPASE, BLOOD  CBC    Procedures Procedures (including critical care time)   Medications Ordered in ED Medications  cephALEXin (KEFLEX)  capsule 500 mg (500 mg Oral Given 02/26/17 1822)  phenazopyridine (PYRIDIUM) tablet 200 mg (200 mg Oral Given 02/26/17 1822)     Initial Impression / Assessment and Plan / ED Course  I have reviewed the triage vital signs and the nursing notes.  Pertinent labs & imaging results that were available during my care of the patient were reviewed by me and considered in my medical decision making (see chart for details).  Patient presents to emergency room with complaints of hematuria and dysuria. She is  not having any flank pain. No fevers. I doubt pyelonephritis or kidney stones. Her symptoms are consistent with a hemorrhagic cystitis.  Patient's blood pressures are on the low end of normal but I doubt sepsis. She appears comfortable and I suspect that is her baseline (i reviewed prior vital signs and her Blood pressure are in the high 90s and low 100s generally). Plan on discharge home with prescription for oral antibiotics.  Final Clinical Impressions(s) / ED Diagnoses   Final diagnoses:  Acute cystitis with hematuria    New Prescriptions New Prescriptions   CEPHALEXIN (KEFLEX) 500 MG CAPSULE    Take 1 capsule (500 mg total) by mouth 4 (four) times daily.   PHENAZOPYRIDINE (PYRIDIUM) 200 MG TABLET    Take 1 tablet (200 mg total) by mouth 3 (three) times daily.     Dorie Rank, MD 02/26/17 [id number]

## 2017-02-28 LAB — URINE CULTURE: Culture: >=100000 — AB

## 2017-04-28 ENCOUNTER — Emergency Department (HOSPITAL_COMMUNITY)
Admission: EM | Admit: 2017-04-28 | Discharge: 2017-04-28 | Disposition: A | Discharge status: Home / Self Care | Payer: BLUE CROSS/BLUE SHIELD | Attending: Emergency Medicine | Admitting: Emergency Medicine

## 2017-04-28 ENCOUNTER — Emergency Department (HOSPITAL_COMMUNITY): Payer: BLUE CROSS/BLUE SHIELD

## 2017-04-28 ENCOUNTER — Encounter (HOSPITAL_COMMUNITY): Payer: Self-pay

## 2017-04-28 DIAGNOSIS — F17210 Nicotine dependence, cigarettes, uncomplicated: Secondary | ICD-10-CM | POA: Insufficient documentation

## 2017-04-28 DIAGNOSIS — M25539 Pain in unspecified wrist: Secondary | ICD-10-CM | POA: Diagnosis present

## 2017-04-28 DIAGNOSIS — Z79899 Other long term (current) drug therapy: Secondary | ICD-10-CM | POA: Diagnosis not present

## 2017-04-28 DIAGNOSIS — M25531 Pain in right wrist: Secondary | ICD-10-CM | POA: Insufficient documentation

## 2017-04-28 DIAGNOSIS — M25532 Pain in left wrist: Secondary | ICD-10-CM | POA: Diagnosis not present

## 2017-04-28 MED ORDER — IBUPROFEN 800 MG PO TABS
800.0000 mg | ORAL_TABLET | Freq: Once | ORAL | Status: AC
  Administered 2017-04-28: 800 mg via ORAL
  Filled 2017-04-28: qty 1

## 2017-04-28 NOTE — ED Triage Notes (Signed)
PT refused  Ice to Bil. Wrist . Pt reported ice did not help pain.

## 2017-04-28 NOTE — Discharge Instructions (Signed)
Please call Dr. Angus Palms office to schedule a follow-up appointment regarding your symptoms. You can take 800 mg of ibuprofen every 8 hours with food as needed for pain and inflammation control. Please wear your wrist braces as needed to help keep her wrist in a neutral position.   If you develop new or worsening symptoms, including a new fall or injury, numbness, or worsening weakness in the hands, please return to the emergency department for reevaluation. If symptoms persist, please follow-up with your primary care provider.

## 2017-04-28 NOTE — ED Notes (Signed)
Pt ambulated to room from waiting area. Pt ambulated with a steady gait and no complaints.

## 2017-04-28 NOTE — ED Notes (Signed)
Declined W/C at D/C and was escorted to lobby by RN. 

## 2017-04-28 NOTE — ED Triage Notes (Signed)
Per Pt, pt is coming from home with complaints of bilateral wrist pain due to hx of carpal tunnel. Reports some swelling.

## 2017-04-28 NOTE — ED Provider Notes (Signed)
[facility] DEPT Provider Note   CSN: [id number] Arrival date & time: 04/28/17  0820     History   Chief Complaint Chief Complaint  Patient presents with  . Carpal Tunnel    [name] is a 46 y.o. female who presents to the emergency department with worsening bilateral wrist pain that began 2 weeks ago after a fall on her bilateral outstretched hands. She denies hitting her head. She reports that she had a carpal tunnel release 3 months ago by Dr. Seward Carol on her right wrist, and states that she needs to have the procedure completed on her left wrist. She denies numbness, tingling, or weakness in the hands or fingers. She states that she works in [employer] at [employer], and had to call out of work today because the pain was more severe.  The history is provided by the patient. No language interpreter was used.    Past Medical History:  Diagnosis Date  . Carpal tunnel syndrome of right wrist   . Ganglion cyst of wrist, right   . History of adenomatous polyp of colon    tubular adenoma's 09/ 2016;  06/ 2017  . History of Clostridium difficile colitis 05/21/2016  . History of frequent urinary tract infections   . History of gastric ulcer    clinical diagnosis, no prior EGD  . History of uterine fibroid   . Migraines   . OA (osteoarthritis)     Patient Active Problem List   Diagnosis Date Noted  . Ganglion cyst of wrist, right 09/16/2016  . Esophageal reflux 05/24/2016  . Cough 05/24/2016  . Anal fissure 10/23/2015  . Epidermoid cyst 09/02/2015  . 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 Tinea pedis 05/26/2015  . Pain of left thumb 05/26/2015  . 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 De Quervain's tenosynovitis, left 04/14/2015  . Neck pain on right side 01/13/2015  . Carpal tunnel syndrome 12/30/2014  . Smoker 07/03/2014  . Family history of stomach cancer 07/03/2014    Past Surgical History:  Procedure Laterality Date  . CARPAL  TUNNEL RELEASE Right 01/13/2017   Procedure: CARPAL TUNNEL RELEASE;  Surgeon: Iran Planas, MD;  Location: [facility];  Service: Orthopedics;  Laterality: Right;  . COLONOSCOPY  last one 01/ 03/ 2018   sigmoid scope  . GANGLION CYST EXCISION Right 01/13/2017   Procedure: REMOVAL GANGLION OF WRIST;  Surgeon: Iran Planas, MD;  Location: [facility];  Service: Orthopedics;  Laterality: Right;  . KNEE ARTHROSCOPY Left ~ 2015  . VAGINAL HYSTERECTOMY  2006    OB History    No data available       Home Medications    Prior to Admission medications   Medication Sig Start Date End Date Taking? Authorizing Provider  cephALEXin (KEFLEX) 500 MG capsule Take 1 capsule (500 mg total) by mouth 4 (four) times daily. 02/26/17   Dorie Rank, MD  docusate sodium (COLACE) 100 MG capsule Take 1 capsule (100 mg total) by mouth 2 (two) times daily. 01/13/17   Iran Planas, MD  gabapentin (NEURONTIN) 300 MG capsule Take 1 capsule (300 mg total) by mouth 3 (three) times daily. 11/22/16   Narda Amber K, DO  ibuprofen (ADVIL,MOTRIN) 200 MG tablet Take 200 mg by mouth every 6 (six) hours as needed.    [provider]  phenazopyridine (PYRIDIUM) 200 MG tablet Take 1 tablet (200 mg total) by mouth 3 (three) times daily. 02/26/17   Dorie Rank,  MD    Family History Family History  Problem Relation Age of Onset  . Diabetes Mother   . Hypertension Mother   . Ovarian cancer Mother   . Stomach cancer Maternal Uncle   . Heart disease Sister   . Healthy Brother   . Asthma Son   . Asthma Daughter   . Colon cancer Maternal Uncle 35  . Colon polyps Neg Hx   . Esophageal cancer Neg Hx   . Rectal cancer Neg Hx     Social History Social History  Substance Use Topics  . Smoking status: Current Every Day Smoker    Packs/day: 0.50    Years: 29.00    Types: Cigarettes  . Smokeless tobacco: Never Used  . Alcohol use No     Allergies   Sulfa antibiotics   Review of  Systems Review of Systems  Musculoskeletal: Positive for arthralgias, joint swelling and myalgias.  Skin: Negative for wound.  Neurological: Negative for weakness and numbness.     Physical Exam Updated Vital Signs BP 112/78 (BP Location: Right Arm)   Pulse 82   Temp 98.4 F (36.9 C) (Oral)   Resp 16   Ht 5\' 1"  (1.549 m)   Wt 75.8 kg (167 lb)   SpO2 100%   BMI 31.55 kg/m   Physical Exam  Constitutional: No distress.  HENT:  Head: Normocephalic.  Eyes: Conjunctivae are normal.  Neck: Neck supple.  Cardiovascular: Normal rate and regular rhythm.  Exam reveals no gallop and no friction rub.   No murmur heard. Pulmonary/Chest: Effort normal. No respiratory distress.  Abdominal: Soft. She exhibits no distension.  Musculoskeletal:  Full passive and active range of motion of the bilateral shoulders, elbows, and wrists. Increased pain with flexion and extension of the bilateral wrists. Anatomic snuffbox tenderness bilaterally. Radial pulses 2+ bilaterally. Good strength of all 10 digits against resistance. 5 out of 5 grip strength of the bilateral upper extremities against resistance. Sensation is intact throughout.  Neurological: She is alert.  Skin: Skin is warm. No rash noted.  Psychiatric: Her behavior is normal.  Nursing note and vitals reviewed.    ED Treatments / Results  Labs (all labs ordered are listed, but only abnormal results are displayed) Labs Reviewed - No data to display  EKG  EKG Interpretation None       Radiology Dg Wrist Complete Left  Result Date: 04/28/2017 CLINICAL DATA:  [place] 2 weeks ago with bilateral wrist pain EXAM: LEFT WRIST - COMPLETE 3+ VIEW COMPARISON:  MR left wrist of 07/23/2015 and plain films of 04/29/2015 FINDINGS: There has been interval progression of the previously identified degenerative change involve the articulation of the navicula with the trapezium. Larger subchondral cysts are now present with sclerosis and some spurring.  No acute fracture is seen. Alignment is normal. The radiocarpal joint space appears normal, and the ulnar styloid is intact. IMPRESSION: Progression of degenerative change involving the articulation of the trapezium with the navicula. No acute fracture. Electronically Signed   By: Ivar Drape M.D.   On: 04/28/2017 11:04   Dg Wrist Complete Right  Result Date: 04/28/2017 CLINICAL DATA:  [place] 2 weeks ago with bilateral wrist pain EXAM: RIGHT WRIST - COMPLETE 3+ VIEW COMPARISON:  Right wrist films of 02/12/2015 FINDINGS: There has been interval progression of degenerative change involving the articulation of the navicula with the trapezium with loss of joint space, sclerosis, and subchondral cyst formation with spurring. No acute fracture is seen. The radiocarpal joint  space appears normal and the ulnar styloid is intact. IMPRESSION: No fracture. Progression of degenerative change involving the radial aspect of the carpal bones. Electronically Signed   By: Ivar Drape M.D.   On: 04/28/2017 11:02    Procedures Procedures (including critical care time)  Medications Ordered in ED Medications  ibuprofen (ADVIL,MOTRIN) tablet 800 mg (800 mg Oral Given 04/28/17 1023)     Initial Impression / Assessment and Plan / ED Course  I have reviewed the triage vital signs and the nursing notes.  Pertinent labs & imaging results that were available during my care of the patient were reviewed by me and considered in my medical decision making (see chart for details).     Patient X-Ray negative for obvious fracture or dislocation; chronic degenerative changes noted bilaterally. Pain managed in ED. Pt advised to follow up with Dr. Caralyn Guile if symptoms worsen or her PCP is symptoms persist. Patient given brace while in ED, conservative therapy recommended and discussed. Patient will be dc home & is agreeable with above plan.   Final Clinical Impressions(s) / ED Diagnoses   Final diagnoses:  Bilateral wrist pain      New Prescriptions New Prescriptions   No medications on file     Joanne Gavel, PA-C 04/28/17 1155    Mabe, Forbes Cellar, MD 04/28/17 1259

## 2017-05-04 IMAGING — CT CT ABD-PELV W/ CM
2 of 5 series · 16 of 46 positions shown, 18 images · IV contrast (Omni 300)
Comparison: 09/16/2015

CLINICAL DATA: Central abdominal pain with nausea, vomiting, and
diarrhea for 2 weeks. Recent treatment for colitis.

EXAM:
CT ABDOMEN AND PELVIS WITH CONTRAST
TECHNIQUE: Multidetector CT imaging of the abdomen and pelvis was performed
using the standard protocol following bolus administration of
intravenous contrast.
CONTRAST:  100 mL Esovue-NKK

[Series 2: a/p w/ 5mm · axial · 0.74mm/px · z∈[-485,-65]mm · 13 of 94 slices shown, 15 images]
[im 5/94  soft-tissue]
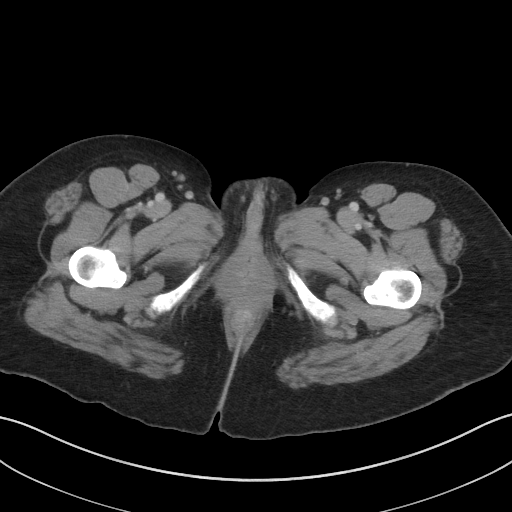
[im 5/94  bone]
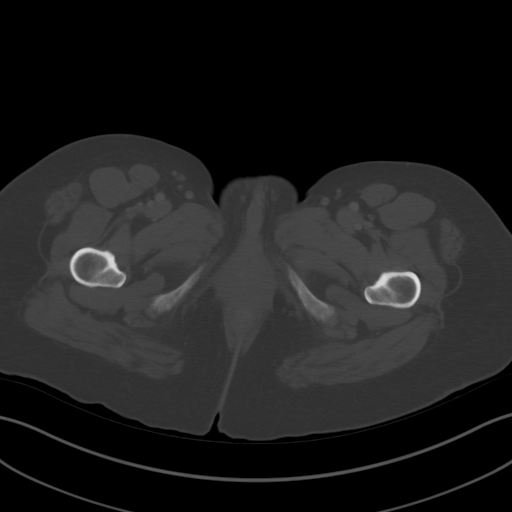
[im 15/94  soft-tissue]
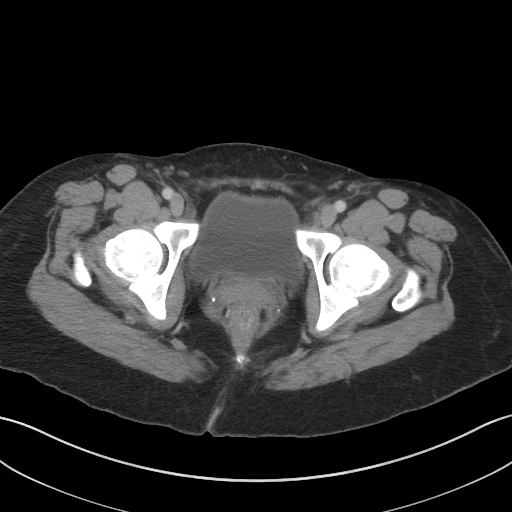
[im 20/94  soft-tissue]
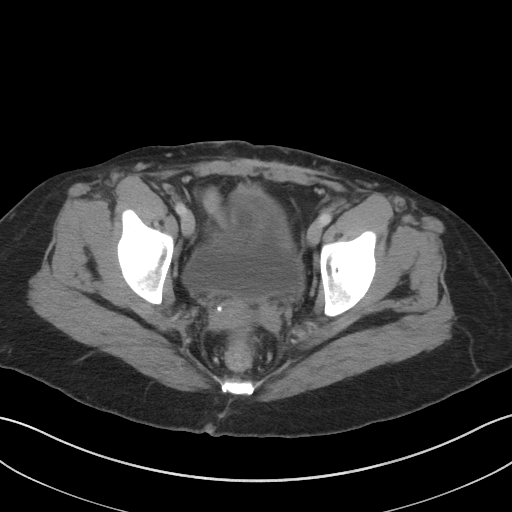
[im 25/94  soft-tissue]
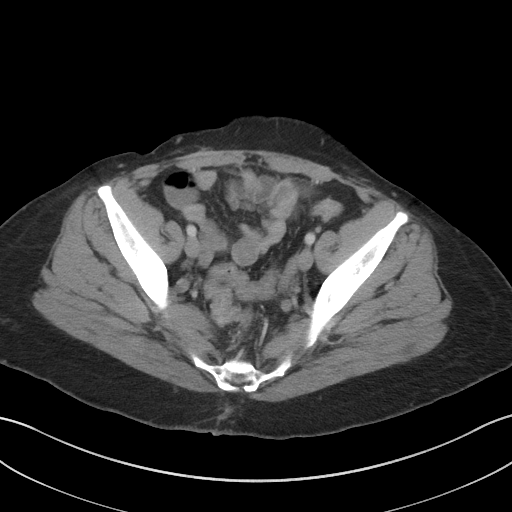
[im 35/94  soft-tissue]
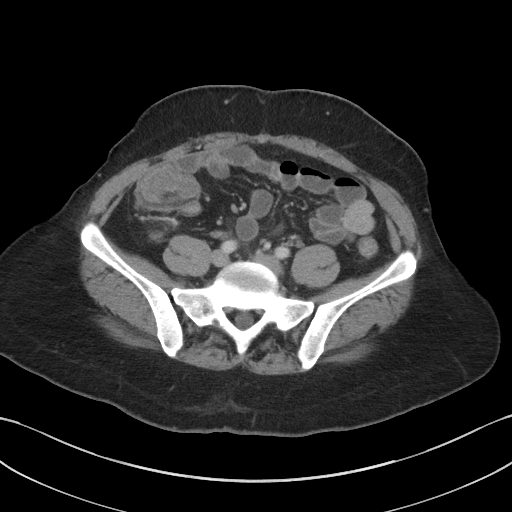
[im 40/94  soft-tissue]
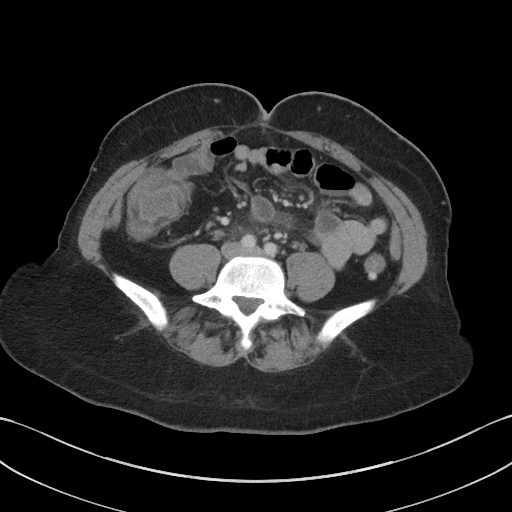
[im 49/94  soft-tissue]
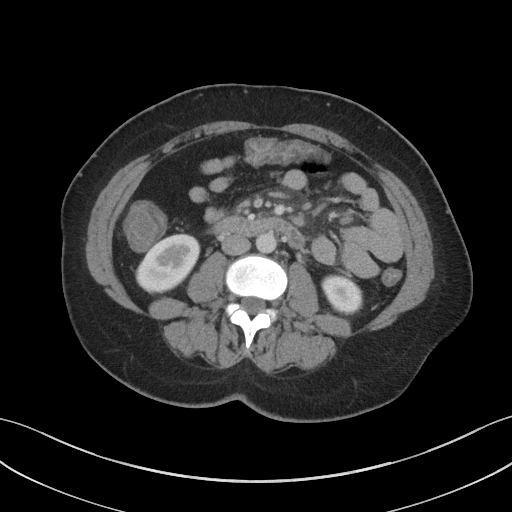
[im 54/94  soft-tissue]
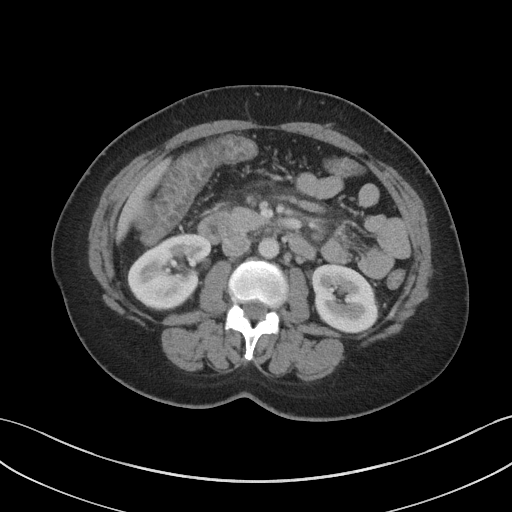
[im 59/94  soft-tissue]
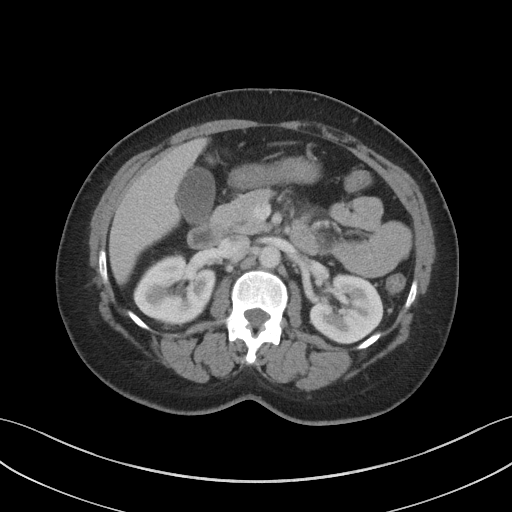
[im 59/94  bone]
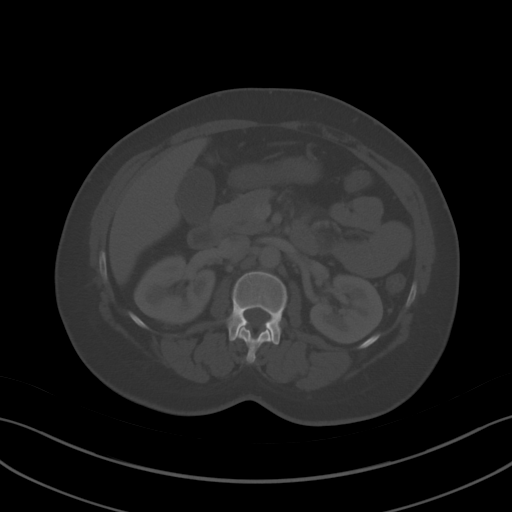
[im 69/94  soft-tissue]
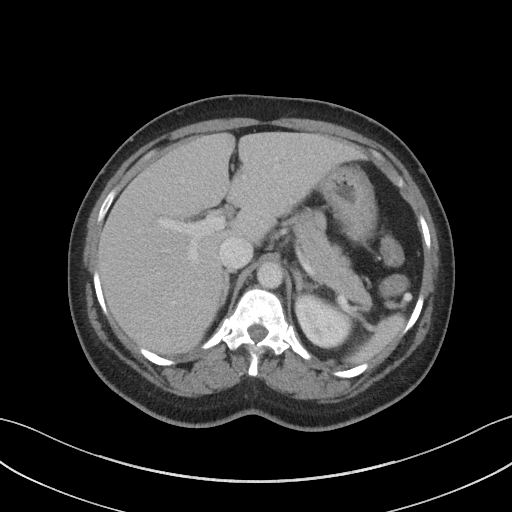
[im 74/94  soft-tissue]
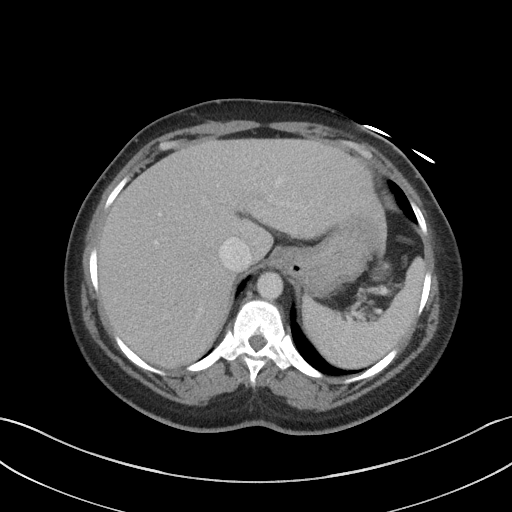
[im 79/94  soft-tissue]
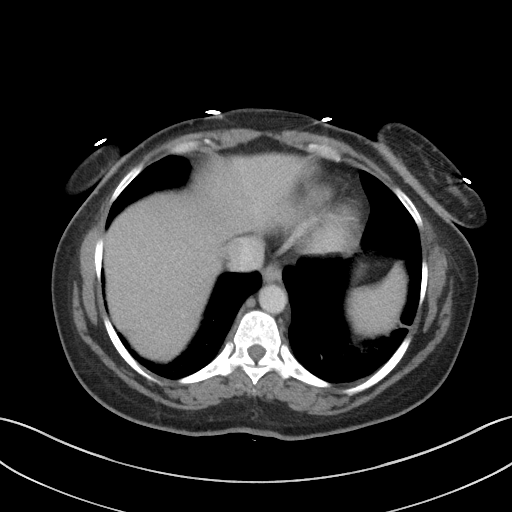
[im 89/94  soft-tissue]
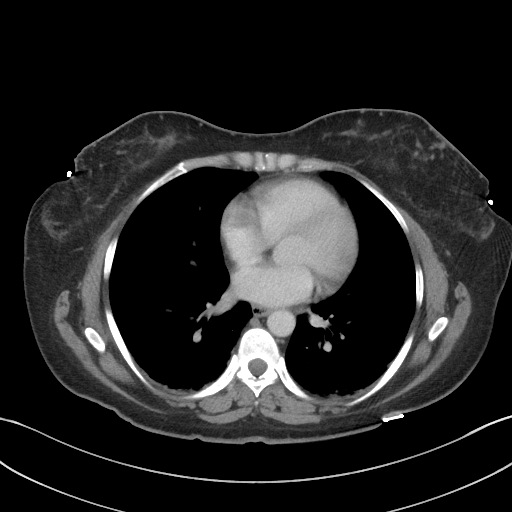

[Series 5: a/p w/ cor · coronal · 0.79mm/px · 3 of 128 slices shown]
[im 43/128  soft-tissue]
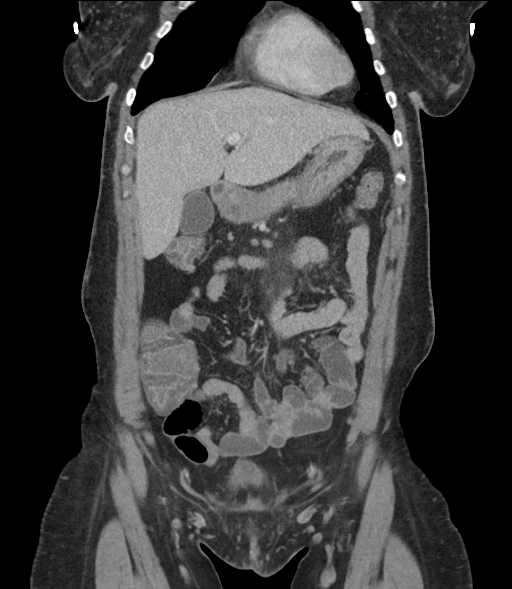
[im 57/128  soft-tissue]
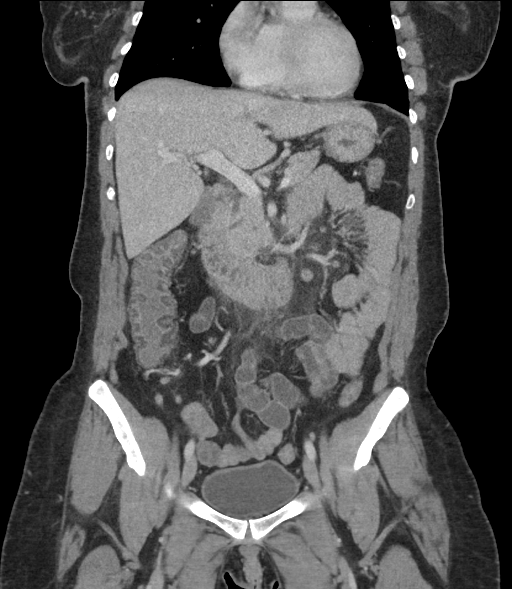
[im 71/128  soft-tissue]
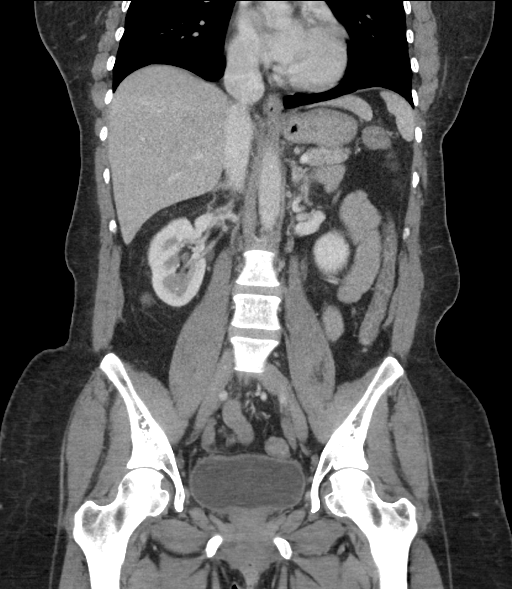

[16 of 46 positions shown; findings below may reference images not displayed]

FINDINGS: Atelectasis in the lung bases.

The liver, spleen, gallbladder, pancreas, adrenal glands, kidneys,
abdominal aorta, and retroperitoneal lymph nodes are unremarkable.
Mildly enlarged lymph nodes and hazy infiltration of the root of the
mesenteric likely representing mesenteritis. Stomach, small bowel,
and colon are not abnormally distended. Colon is decompressed but
there appears to be diffuse wall thickening and edema of the colon.
This is likely to indicate colitis, possibly infectious or
pseudomembranous. No free air or free fluid in the abdomen.

Pelvis: Bladder wall is not thickened. Uterus is surgically absent.
No pelvic mass or lymphadenopathy. No free or loculated pelvic fluid
collections. Appendix is normal. No destructive bone lesions.
IMPRESSION: Diffuse colonic wall thickening and edema likely representing
infectious or pseudomembranous colitis. Prominent lymph nodes and
infiltration in the root of the mesenteric likely inflammatory and
suggesting mesenteritis. No abscess or obstruction.

## 2017-10-23 ENCOUNTER — Encounter (HOSPITAL_COMMUNITY): Payer: Self-pay | Admitting: *Deleted

## 2017-10-23 ENCOUNTER — Other Ambulatory Visit: Payer: Self-pay

## 2017-10-23 ENCOUNTER — Emergency Department (HOSPITAL_COMMUNITY): Payer: BLUE CROSS/BLUE SHIELD

## 2017-10-23 ENCOUNTER — Observation Stay (HOSPITAL_COMMUNITY)
Admission: EM | Admit: 2017-10-23 | Discharge: 2017-10-24 | Disposition: A | Discharge status: Home / Self Care | Payer: BLUE CROSS/BLUE SHIELD | Attending: Internal Medicine | Admitting: Internal Medicine

## 2017-10-23 DIAGNOSIS — M1990 Unspecified osteoarthritis, unspecified site: Secondary | ICD-10-CM | POA: Insufficient documentation

## 2017-10-23 DIAGNOSIS — Z8742 Personal history of other diseases of the female genital tract: Secondary | ICD-10-CM | POA: Diagnosis not present

## 2017-10-23 DIAGNOSIS — Z8041 Family history of malignant neoplasm of ovary: Secondary | ICD-10-CM | POA: Insufficient documentation

## 2017-10-23 DIAGNOSIS — Z800 Family history of malignant neoplasm of digestive organs: Secondary | ICD-10-CM | POA: Insufficient documentation

## 2017-10-23 DIAGNOSIS — Z825 Family history of asthma and other chronic lower respiratory diseases: Secondary | ICD-10-CM | POA: Insufficient documentation

## 2017-10-23 DIAGNOSIS — R319 Hematuria, unspecified: Secondary | ICD-10-CM | POA: Diagnosis not present

## 2017-10-23 DIAGNOSIS — A419 Sepsis, unspecified organism: Secondary | ICD-10-CM | POA: Diagnosis not present

## 2017-10-23 DIAGNOSIS — Z87440 Personal history of urinary (tract) infections: Secondary | ICD-10-CM | POA: Insufficient documentation

## 2017-10-23 DIAGNOSIS — J09X2 Influenza due to identified novel influenza A virus with other respiratory manifestations: Principal | ICD-10-CM | POA: Insufficient documentation

## 2017-10-23 DIAGNOSIS — J111 Influenza due to unidentified influenza virus with other respiratory manifestations: Secondary | ICD-10-CM

## 2017-10-23 DIAGNOSIS — F17210 Nicotine dependence, cigarettes, uncomplicated: Secondary | ICD-10-CM | POA: Diagnosis not present

## 2017-10-23 DIAGNOSIS — N179 Acute kidney failure, unspecified: Secondary | ICD-10-CM | POA: Diagnosis not present

## 2017-10-23 DIAGNOSIS — Z833 Family history of diabetes mellitus: Secondary | ICD-10-CM | POA: Insufficient documentation

## 2017-10-23 DIAGNOSIS — Z882 Allergy status to sulfonamides status: Secondary | ICD-10-CM | POA: Insufficient documentation

## 2017-10-23 DIAGNOSIS — Z90710 Acquired absence of both cervix and uterus: Secondary | ICD-10-CM

## 2017-10-23 DIAGNOSIS — J101 Influenza due to other identified influenza virus with other respiratory manifestations: Secondary | ICD-10-CM | POA: Diagnosis present

## 2017-10-23 DIAGNOSIS — R509 Fever, unspecified: Secondary | ICD-10-CM | POA: Diagnosis present

## 2017-10-23 LAB — CBC WITH DIFFERENTIAL/PLATELET
Basophils Absolute: 0.1 10*3/uL (ref 0.0–0.1)
Basophils Relative: 1 %
EOS PCT: 1 %
Eosinophils Absolute: 0 10*3/uL (ref 0.0–0.7)
HEMATOCRIT: 40 % (ref 36.0–46.0)
Hemoglobin: 13.5 g/dL (ref 12.0–15.0)
Lymphocytes Relative: 17 %
Lymphs Abs: 0.8 10*3/uL (ref 0.7–4.0)
MCH: 31.8 pg (ref 26.0–34.0)
MCHC: 33.8 g/dL (ref 30.0–36.0)
MCV: 94.3 fL (ref 78.0–100.0)
Monocytes Absolute: 0.8 10*3/uL (ref 0.1–1.0)
Monocytes Relative: 15 %
NEUTROS PCT: 66 %
Neutro Abs: 3.4 10*3/uL (ref 1.7–7.7)
Platelets: 181 10*3/uL (ref 150–400)
RBC: 4.24 MIL/uL (ref 3.87–5.11)
RDW: 13.7 % (ref 11.5–15.5)
WBC: 5.1 10*3/uL (ref 4.0–10.5)

## 2017-10-23 LAB — COMPREHENSIVE METABOLIC PANEL
ALK PHOS: 56 U/L (ref 38–126)
ALT: 14 U/L (ref 14–54)
AST: 32 U/L (ref 15–41)
Albumin: 3.7 g/dL (ref 3.5–5.0)
Anion gap: 13 (ref 5–15)
BUN: 6 mg/dL (ref 6–20)
CALCIUM: 9.1 mg/dL (ref 8.9–10.3)
CO2: 19 mmol/L — ABNORMAL LOW (ref 22–32)
CREATININE: 1.02 mg/dL — AB (ref 0.44–1.00)
Chloride: 105 mmol/L (ref 101–111)
Glucose, Bld: 133 mg/dL — ABNORMAL HIGH (ref 65–99)
Potassium: 3.3 mmol/L — ABNORMAL LOW (ref 3.5–5.1)
Sodium: 137 mmol/L (ref 135–145)
Total Bilirubin: 0.4 mg/dL (ref 0.3–1.2)
Total Protein: 6.8 g/dL (ref 6.5–8.1)

## 2017-10-23 LAB — URINALYSIS, ROUTINE W REFLEX MICROSCOPIC
Bilirubin Urine: NEGATIVE
Glucose, UA: NEGATIVE mg/dL
KETONES UR: NEGATIVE mg/dL
LEUKOCYTES UA: NEGATIVE
Nitrite: NEGATIVE
Protein, ur: NEGATIVE mg/dL
SPECIFIC GRAVITY, URINE: 1.023 (ref 1.005–1.030)
pH: 5 (ref 5.0–8.0)

## 2017-10-23 LAB — STREP PNEUMONIAE URINARY ANTIGEN: Strep Pneumo Urinary Antigen: NEGATIVE

## 2017-10-23 LAB — INFLUENZA PANEL BY PCR (TYPE A & B)
Influenza A By PCR: POSITIVE — AB
Influenza B By PCR: NEGATIVE

## 2017-10-23 LAB — I-STAT CG4 LACTIC ACID, ED
LACTIC ACID, VENOUS: 0.39 mmol/L — AB (ref 0.5–1.9)
Lactic Acid, Venous: 2.97 mmol/L (ref 0.5–1.9)

## 2017-10-23 MED ORDER — SODIUM CHLORIDE 0.9 % IV BOLUS (SEPSIS)
2000.0000 mL | Freq: Once | INTRAVENOUS | Status: AC
  Administered 2017-10-23: 2000 mL via INTRAVENOUS

## 2017-10-23 MED ORDER — ENOXAPARIN SODIUM 40 MG/0.4ML ~~LOC~~ SOLN
40.0000 mg | SUBCUTANEOUS | Status: DC
  Administered 2017-10-23: 40 mg via SUBCUTANEOUS
  Filled 2017-10-23: qty 0.4

## 2017-10-23 MED ORDER — PNEUMOCOCCAL VAC POLYVALENT 25 MCG/0.5ML IJ INJ
0.5000 mL | INJECTION | INTRAMUSCULAR | Status: DC
  Filled 2017-10-23: qty 0.5

## 2017-10-23 MED ORDER — BENZONATATE 100 MG PO CAPS
200.0000 mg | ORAL_CAPSULE | Freq: Three times a day (TID) | ORAL | Status: DC | PRN
  Filled 2017-10-23: qty 2

## 2017-10-23 MED ORDER — GUAIFENESIN ER 600 MG PO TB12
600.0000 mg | ORAL_TABLET | Freq: Two times a day (BID) | ORAL | Status: DC
  Administered 2017-10-23 – 2017-10-24 (×2): 600 mg via ORAL
  Filled 2017-10-23 (×3): qty 1

## 2017-10-23 MED ORDER — PIPERACILLIN-TAZOBACTAM 3.375 G IVPB 30 MIN
3.3750 g | Freq: Once | INTRAVENOUS | Status: AC
  Administered 2017-10-23: 3.375 g via INTRAVENOUS
  Filled 2017-10-23: qty 50

## 2017-10-23 MED ORDER — SODIUM CHLORIDE 0.9 % IV SOLN
INTRAVENOUS | Status: DC
  Administered 2017-10-23: via INTRAVENOUS

## 2017-10-23 MED ORDER — POTASSIUM CHLORIDE IN NACL 40-0.9 MEQ/L-% IV SOLN
INTRAVENOUS | Status: DC
Start: 2017-10-23 — End: 2017-10-24
  Administered 2017-10-23: 100 mL/h via INTRAVENOUS
  Filled 2017-10-23 (×2): qty 1000

## 2017-10-23 MED ORDER — ONDANSETRON HCL 4 MG/2ML IJ SOLN: 4.0000 mg | Freq: Four times a day (QID) | INTRAMUSCULAR | Status: DC | PRN

## 2017-10-23 MED ORDER — INFLUENZA VAC SPLIT QUAD 0.5 ML IM SUSY
0.5000 mL | PREFILLED_SYRINGE | INTRAMUSCULAR | Status: DC
  Filled 2017-10-23: qty 0.5

## 2017-10-23 MED ORDER — ACETAMINOPHEN 325 MG PO TABS
650.0000 mg | ORAL_TABLET | Freq: Four times a day (QID) | ORAL | Status: DC | PRN
  Administered 2017-10-23 – 2017-10-24 (×3): 650 mg via ORAL
  Filled 2017-10-23 (×3): qty 2

## 2017-10-23 MED ORDER — ACETAMINOPHEN 325 MG PO TABS
650.0000 mg | ORAL_TABLET | Freq: Once | ORAL | Status: AC | PRN
  Administered 2017-10-23: 650 mg via ORAL
  Filled 2017-10-23: qty 2

## 2017-10-23 MED ORDER — VANCOMYCIN HCL IN DEXTROSE 1-5 GM/200ML-% IV SOLN
1000.0000 mg | Freq: Once | INTRAVENOUS | Status: AC
  Administered 2017-10-23: 1000 mg via INTRAVENOUS
  Filled 2017-10-23: qty 200

## 2017-10-23 MED ORDER — OSELTAMIVIR PHOSPHATE 75 MG PO CAPS
75.0000 mg | ORAL_CAPSULE | Freq: Once | ORAL | Status: AC
  Administered 2017-10-23: 75 mg via ORAL
  Filled 2017-10-23: qty 1

## 2017-10-23 MED ORDER — OSELTAMIVIR PHOSPHATE 75 MG PO CAPS
75.0000 mg | ORAL_CAPSULE | Freq: Two times a day (BID) | ORAL | Status: DC
  Administered 2017-10-23 – 2017-10-24 (×2): 75 mg via ORAL
  Filled 2017-10-23 (×2): qty 1

## 2017-10-23 NOTE — ED Notes (Addendum)
Requested Secretary to page admitting MD so may inquire if pt bed status may be changed back to M-S d/t 5W has no more tele monitors per PJ, RN - 5W.

## 2017-10-23 NOTE — Research (Signed)
S: met with April Hudson 01/10/1971 to discuss the below  study. We discussed above research concepts as well that are enumerated.  She feels symptomatic from flu A. She is definitely interested in participating especically only if she is unimproved 10/24/17 and primary MD thinks she will be hospitalized longer. At this point, primary MD feels patient might be discharged 10/24/17. Copy of consent given for her to read at 16.00h. Will follow. She did want me to update her husbang but went to voice mail. No message left    Dr. Brand Males, M.D., [facility].C.P Pulmonary and Critical Care Medicine Staff Physician, [place] Director - Interstitial Lung Disease  Program  Pulmonary [place] at [place], [place], [address]  Pager: [phone], If no answer or between  15:00h - 7:00h: call [phone] Telephone: [phone]    .......................... Title: A Randomized, Double-Blind, Placebo-Controlled Dose Ranging Study Evaluating the Safety Pharmacokinetics and Clinical Benefit of FLU-IGIV in Hospitalized Patients with Serious Influenza A infection. IA-001 (ClinicalTrials.gov Identifier: [id number], Protocol No: IA-001, [facility] Protocol #[id number])  RESEARCH SUBJECT. This research study is sponsored by [employer]   Protocol: amendment 4 -> 24 Feb 2017  The investigational product is called NP-025 aka FLU-IGIV or anti-influenza immune globulin intravenous. It is produced from source plasma collected from [place] Transport planner (FDA) licensed plasma collection establishments from healthy donors who have recovered from influenza (convalescent) and/or were vaccinated against seasonal influenza strains. The plasma contains a relatively high concentration of polyclonal antibodies directed against seasonal influenza strains, specifically influenza A strains H1N1 ([place]) and H3N2  ([place]). It is a glycoprotein of 150-160 kilodaltons against Hemagluttinin (HA) and Neuraminidase (NA) surface proteins.  ...................................................................................................  1. Scientific Purpose  Clinical research is designed to produce generalizable knowledge and to answer questions about the safety and efficacy of intervention(s) under study in order to determine whether or not they may be useful for the care of future patients.  2. Study Procedures  Participation in a trial may involve procedures or tests, in addition to the intervention(s) under study, that are intended only or primarily to generate scientific knowledge and that are otherwise not necessary for patient care.   3. Uncertainty  For intervention(s) under study in clinical research, there often is less knowledge and more uncertainty about the risks and benefits to a population of trial participants than there is when a doctor offers a patient standard interventions.   4. Adherence to Protocol  Administration of the intervention(s) under study is typically based on a strict protocol with defined dose, scheduling, and use or avoidance of concurrent medications, compared to administration of standard interventions.  5. Clinician as Investigator  Clinicians who are in health care settings provide treatment; in a clinical trial setting, they are also investigating safety and efficacy of an intervention. In otherwise your doctor or nurse practitioner can be wearing 2 hats - one as care giver another as Company secretary  6. Patient as Visual merchandiser Subject  Patients participating in research trials are research subjects or volunteers. In other words participating in research is 100% voluntary and at one's own free weill. The decision to participate or not participate will NOT affect patient care and the doctor-patient relationship in any  way  ......................................Marland Kitchen

## 2017-10-23 NOTE — ED Notes (Signed)
Lab Results reported to Nurse Darci Current.

## 2017-10-23 NOTE — ED Provider Notes (Signed)
[facility] EMERGENCY DEPARTMENT Provider Note   CSN: [id number] Arrival date & time: 10/23/17  0754     History   Chief Complaint Chief Complaint  Patient presents with  . Influenza    [name] is a 47 y.o. female.  HPI Patient states she felt well yesterday evening.  She woke with frontal headache, nasal congestion, sore throat, cough productive of white sputum, right flank pain, nausea, diffuse body aches and fever and chills.  Denies any urinary symptoms including hematuria, urgency, dysuria or frequency.  Denies vomiting or diarrhea.  Past Medical History:  Diagnosis Date  . Carpal tunnel syndrome of right wrist   . Ganglion cyst of wrist, right   . History of adenomatous polyp of colon    tubular adenoma's 09/ 2016;  06/ 2017  . History of Clostridium difficile colitis 05/21/2016  . History of frequent urinary tract infections   . History of gastric ulcer    clinical diagnosis, no prior EGD  . History of uterine fibroid   . Migraines   . OA (osteoarthritis)     Patient Active Problem List   Diagnosis Date Noted  . Ganglion cyst of wrist, right 09/16/2016  . Esophageal reflux 05/24/2016  . Cough 05/24/2016  . Anal fissure 10/23/2015  . Epidermoid cyst 09/02/2015  . 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 Tinea pedis 05/26/2015  . Pain of left thumb 05/26/2015  . 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 De Quervain's tenosynovitis, left 04/14/2015  . Neck pain on right side 01/13/2015  . Carpal tunnel syndrome 12/30/2014  . Smoker 07/03/2014  . Family history of stomach cancer 07/03/2014    Past Surgical History:  Procedure Laterality Date  . CARPAL TUNNEL RELEASE Right 01/13/2017   Procedure: CARPAL TUNNEL RELEASE;  Surgeon: Iran Planas, MD;  Location: [facility];  Service: Orthopedics;  Laterality: Right;  . COLONOSCOPY  last one 01/ 03/ 2018   sigmoid scope  . GANGLION CYST EXCISION Right  01/13/2017   Procedure: REMOVAL GANGLION OF WRIST;  Surgeon: Iran Planas, MD;  Location: [facility];  Service: Orthopedics;  Laterality: Right;  . KNEE ARTHROSCOPY Left ~ 2015  . VAGINAL HYSTERECTOMY  2006    OB History    No data available       Home Medications    Prior to Admission medications   Medication Sig Start Date End Date Taking? Authorizing Provider  cephALEXin (KEFLEX) 500 MG capsule Take 1 capsule (500 mg total) by mouth 4 (four) times daily. 02/26/17   Dorie Rank, MD  docusate sodium (COLACE) 100 MG capsule Take 1 capsule (100 mg total) by mouth 2 (two) times daily. 01/13/17   Iran Planas, MD  gabapentin (NEURONTIN) 300 MG capsule Take 1 capsule (300 mg total) by mouth 3 (three) times daily. 11/22/16   Narda Amber K, DO  ibuprofen (ADVIL,MOTRIN) 200 MG tablet Take 200 mg by mouth every 6 (six) hours as needed.    [provider]  phenazopyridine (PYRIDIUM) 200 MG tablet Take 1 tablet (200 mg total) by mouth 3 (three) times daily. 02/26/17   Dorie Rank, MD    Family History Family History  Problem Relation Age of Onset  . Diabetes Mother   . Hypertension Mother   . Ovarian cancer Mother   . Stomach cancer Maternal Uncle   . Heart disease Sister   . Healthy Brother   . Asthma Son   . Asthma Daughter   .  Colon cancer Maternal Uncle 41  . Colon polyps Neg Hx   . Esophageal cancer Neg Hx   . Rectal cancer Neg Hx     Social History Social History   Tobacco Use  . Smoking status: Current Every Day Smoker    Packs/day: 0.50    Years: 29.00    Pack years: 14.50    Types: Cigarettes  . Smokeless tobacco: Never Used  Substance Use Topics  . Alcohol use: No    Alcohol/week: 0.0 oz  . Drug use: No     Allergies   Sulfa antibiotics   Review of Systems Review of Systems  Constitutional: Positive for activity change, chills, diaphoresis, fatigue and fever.  HENT: Positive for congestion, sinus pressure and sore throat. Negative  for trouble swallowing and voice change.   Eyes: Negative for visual disturbance.  Respiratory: Positive for cough. Negative for shortness of breath.   Cardiovascular: Negative for chest pain, palpitations and leg swelling.  Gastrointestinal: Positive for nausea. Negative for abdominal pain, blood in stool, constipation, diarrhea and vomiting.  Genitourinary: Positive for flank pain. Negative for dysuria, frequency and hematuria.  Musculoskeletal: Positive for back pain and myalgias. Negative for neck pain and neck stiffness.  Skin: Negative for rash and wound.  Neurological: Positive for headaches. Negative for dizziness, weakness, light-headedness and numbness.  All other systems reviewed and are negative.    Physical Exam Updated Vital Signs BP 110/72   Pulse (!) 103   Temp 99.3 F (37.4 C) (Oral)   Resp (!) 24   SpO2 97%   Physical Exam  Constitutional: She is oriented to person, place, and time. She appears well-developed and well-nourished.  HENT:  Head: Normocephalic and atraumatic.  Mouth/Throat: Oropharynx is clear and moist.  Bilateral nasal mucosal edema.  Patient has bilateral frontal sinus tenderness to percussion.  Oropharynx is mildly erythematous with no tonsillar exudates.  Uvula is midline.  Eyes: EOM are normal. Pupils are equal, round, and reactive to light.  Neck: Normal range of motion. Neck supple.  Anterior cervical lymphadenopathy.  No meningismus.  Cardiovascular: Regular rhythm.  Tachycardia.  Pulmonary/Chest: Effort normal and breath sounds normal. No stridor. No respiratory distress. She has no wheezes. She has no rales. She exhibits no tenderness.  Abdominal: Soft. Bowel sounds are normal. There is no tenderness. There is no rebound and no guarding.  Musculoskeletal: Normal range of motion. She exhibits tenderness. She exhibits no edema.  Patient with right-sided CVA tenderness to percussion.  No midline thoracic or lumbar tenderness.    Lymphadenopathy:    She has cervical adenopathy.  Neurological: She is alert and oriented to person, place, and time.  Moves all extremities without focal deficit.  Sensation fully intact.  Skin: Skin is warm. Capillary refill takes less than 2 seconds. No rash noted. She is diaphoretic. No erythema.  Psychiatric: She has a normal mood and affect. Her behavior is normal.  Nursing note and vitals reviewed.    ED Treatments / Results  Labs (all labs ordered are listed, but only abnormal results are displayed) Labs Reviewed  COMPREHENSIVE METABOLIC PANEL - Abnormal; Notable for the following components:      Result Value   Potassium 3.3 (*)    CO2 19 (*)    Glucose, Bld 133 (*)    Creatinine, Ser 1.02 (*)    All other components within normal limits  URINALYSIS, ROUTINE W REFLEX MICROSCOPIC - Abnormal; Notable for the following components:   Hgb urine dipstick LARGE (*)  Bacteria, UA RARE (*)    Squamous Epithelial / LPF 0-5 (*)    All other components within normal limits  INFLUENZA PANEL BY PCR (TYPE A & B) - Abnormal; Notable for the following components:   Influenza A By PCR POSITIVE (*)    All other components within normal limits  I-STAT CG4 LACTIC ACID, ED - Abnormal; Notable for the following components:   Lactic Acid, Venous 2.97 (*)    All other components within normal limits  CULTURE, BLOOD (ROUTINE X 2)  CULTURE, BLOOD (ROUTINE X 2)  CBC WITH DIFFERENTIAL/PLATELET  I-STAT CG4 LACTIC ACID, ED  I-STAT CG4 LACTIC ACID, ED  I-STAT CG4 LACTIC ACID, ED    EKG  EKG Interpretation None       Radiology Dg Chest 2 View  Result Date: 10/23/2017 CLINICAL DATA:  47 year old female with history of body aches, fever, headache and sore throat. EXAM: CHEST  2 VIEW COMPARISON:  Chest x-ray 05/21/2015. FINDINGS: Lung volumes are normal. No consolidative airspace disease. No pleural effusions. No pneumothorax. No pulmonary nodule or mass noted. Pulmonary vasculature and  the cardiomediastinal silhouette are within normal limits. IMPRESSION: No radiographic evidence of acute cardiopulmonary disease. Electronically Signed   By: Vinnie Langton M.D.   On: 10/23/2017 08:42   Ct Renal Stone Study  Result Date: 10/23/2017 CLINICAL DATA:  Hematuria EXAM: CT ABDOMEN AND PELVIS WITHOUT CONTRAST TECHNIQUE: Multidetector CT imaging of the abdomen and pelvis was performed following the standard protocol without oral or IV contrast. COMPARISON:  May 18, 2016 FINDINGS: Lower chest: Lung bases are clear. Hepatobiliary: No focal liver lesions are appreciable on this noncontrast enhanced study. Gallbladder wall does not show appreciable thickening. There is no biliary duct dilatation. Pancreas: No pancreatic mass or inflammatory focus. Spleen: No splenic lesions are evident. Adrenals/Urinary Tract: Adrenals appear normal bilaterally. Kidneys bilaterally show no evident mass or hydronephrosis on either side. There is no appreciable renal or ureteral calculus on either side. Urinary bladder is midline with wall thickness within normal limits. Stomach/Bowel: There are scattered diverticula, most notably in the sigmoid colon. No evident diverticulitis. There is no appreciable bowel wall or mesenteric thickening. There is no appreciable bowel obstruction. No free air or portal venous air. Vascular/Lymphatic: There is no abdominal aortic aneurysm. No vascular lesions are evident on this noncontrast enhanced study. There is no appreciable adenopathy in the abdomen or pelvis. Reproductive: Uterus is absent.  There is no pelvic mass. Other: Appendix appears normal. There is no abscess or ascites in the abdomen or pelvis. Musculoskeletal: There are no blastic or lytic bone lesions. There is no intramuscular or abdominal wall lesion. IMPRESSION: 1. A cause for hematuria has not been established with this study. No renal or ureteral calculus. No hydronephrosis. Urinary bladder wall does not appear  appreciably thickened. 2. Scattered colonic diverticula without diverticulitis. No bowel obstruction. No abscess. Appendix appears normal. 3.  Uterus absent. Electronically Signed   By: Lowella Grip III M.D.   On: 10/23/2017 11:22    Procedures Procedures (including critical care time)  Medications Ordered in ED Medications  acetaminophen (TYLENOL) tablet 650 mg (650 mg Oral Given 10/23/17 0812)  sodium chloride 0.9 % bolus 2,000 mL (0 mLs Intravenous Stopped 10/23/17 1158)  piperacillin-tazobactam (ZOSYN) IVPB 3.375 g (0 g Intravenous Stopped 10/23/17 1047)  vancomycin (VANCOCIN) IVPB 1000 mg/200 mL premix (0 mg Intravenous Stopped 10/23/17 1158)  oseltamivir (TAMIFLU) capsule 75 mg (75 mg Oral Given 10/23/17 1010)   CRITICAL CARE Performed by:  Julianne Rice Total critical care time: 30 minutes Critical care time was exclusive of separately billable procedures and treating other patients. Critical care was necessary to treat or prevent imminent or life-threatening deterioration. Critical care was time spent personally by me on the following activities: development of treatment plan with patient and/or surrogate as well as nursing, discussions with consultants, evaluation of patient's response to treatment, examination of patient, obtaining history from patient or surrogate, ordering and performing treatments and interventions, ordering and review of laboratory studies, ordering and review of radiographic studies, pulse oximetry and re-evaluation of patient's condition.  Initial Impression / Assessment and Plan / ED Course  I have reviewed the triage vital signs and the nursing notes.  Pertinent labs & imaging results that were available during my care of the patient were reviewed by me and considered in my medical decision making (see chart for details).     Patient meets sepsis criteria with elevated lactic acid.  Will start on IV fluids and broad-spectrum antibiotics though concern  for influenza.  Also patient does have some blood in the urine and right flank pain.  Will get renal stone study. CT without acute findings.  Hypertension has improved with IV fluids.  Patient is resting comfortably.  Discussed with hospitalist who will see patient and admit. Final Clinical Impressions(s) / ED Diagnoses   Final diagnoses:  Influenza    ED Discharge Orders    None       Julianne Rice, MD 10/23/17 1214

## 2017-10-23 NOTE — ED Notes (Signed)
Left message w/secretary for RN to call when ready for report.

## 2017-10-23 NOTE — ED Notes (Signed)
[facility] Ordered for [employer]

## 2017-10-23 NOTE — ED Notes (Signed)
Pt aware waiting on bed assignment.

## 2017-10-23 NOTE — ED Notes (Signed)
Lunch ordered for pt as requested.

## 2017-10-23 NOTE — ED Notes (Signed)
Monitor applied to pt. Pt noted to be lying on stretcher - aware continuing to wait for bed assignment.

## 2017-10-23 NOTE — ED Notes (Signed)
Pt's spouse and significant other visited briefly.

## 2017-10-23 NOTE — ED Notes (Signed)
Pt being escorted to 5W via stretcher.

## 2017-10-23 NOTE — ED Notes (Signed)
Requested for Secretary to page MD d/t pt c/o headache and back pain continues.

## 2017-10-23 NOTE — ED Triage Notes (Signed)
Pt reports onset last night of bodyaches, fever, headache, sore throat. Fever noted at triage and pt given mask.

## 2017-10-23 NOTE — H&P (Signed)
Date: 10/23/2017               Patient Name:  April Hudson MRN: [id number]  DOB: 01-21-1971 Age / Sex: 47 y.o., female   PCP: Boykin Nearing, MD         Medical Service: Internal Medicine Teaching Service         Attending Physician: Dr. Aldine Contes, MD    First Contact: Dr. Thomasene Ripple Pager: [phone]  Second Contact: Dr. Ledell Noss Pager: [phone]       After Hours (After 5p/  First Contact Pager: [phone]  weekends / holidays): Second Contact Pager: [phone]   Chief Complaint: Cough, nausea, chills, malaise  History of Present Illness:  April Hudson is a 47 yo with PMH of frequent UTI's and uterine fibroids s/p hysterectomy who presents for evaluation of acute onset of headache, congestion, sore throat, cough, and nausea that started the evening prior to presentation. She states this constellation of symptoms started all of the sudden and she has felt overall unwell ever since they began. Her cough is productive of a clear-yellow sputum. No hemoptysis. She is mostly bothered by frequent chills, as they have caused her to get up multiple times throughout the night to add on clothing and grab extra blankets. She states that her headache is localized to her forehead and associated with nasal congestion. Not associated with changes in vision. She endorses nausea and one episode of emesis, which she states occurred as a result of a long coughing spell. She is able to keep down fluids in spite of continued nausea. She endorsed some R sided flank pain, which may have been associated with coughing or emesis but has improved this AM. She denies polyuria, dysuria, and diarrhea.   Upon arrival to the ED the patient was febrile to 38.6C, tachycardic to 100s (109 max), hypotensive to 90s/60s, with >96% O2 saturation on room air. Her WBC was 5.1. Her BMP was significant for Cr of 1.02 (previous baseline 0.79), potassium of 3.3, and bicarb of 19 with an anion gap of 13. The patient's  initial iSTAT lactic acid was elevated to 2.97. She had a urinalysis that was negative for nitrates and LE, but positive for Hgb (6-30 RBC per HPF). She had a CT renal study which did not reveal renal stones and a chest x ray that was negative for acute infiltrate. Her influenza A/B screen was positive for Influenza A. Given AKI and +influenza A, the patient met sepsis criteria on admission and received 2L NS along with blood cultures x2. Her repeat lactic acid after 2L NS was 0.39. IMTS was called for admission.   Meds:  Current Meds  Medication Sig  . ibuprofen (ADVIL,MOTRIN) 200 MG tablet Take 200 mg by mouth every 6 (six) hours as needed.   Allergies: Allergies as of 10/23/2017 - Review Complete 10/23/2017  Allergen Reaction Noted  . Sulfa antibiotics Hives and Rash 07/03/2014   Past Medical History: Past Medical History:  Diagnosis Date  . Carpal tunnel syndrome of right wrist   . Ganglion cyst of wrist, right   . History of adenomatous polyp of colon    tubular adenoma's 09/ 2016;  06/ 2017  . History of Clostridium difficile colitis 05/21/2016  . History of frequent urinary tract infections   . History of gastric ulcer    clinical diagnosis, no prior EGD  . History of uterine fibroid   . Migraines   . OA (osteoarthritis)    Past  Surgical History: Past Surgical History:  Procedure Laterality Date  . CARPAL TUNNEL RELEASE Right 01/13/2017   Procedure: CARPAL TUNNEL RELEASE;  Surgeon: Iran Planas, MD;  Location: [place];  Service: Orthopedics;  Laterality: Right;  . COLONOSCOPY  last one 01/ 03/ 2018   sigmoid scope  . GANGLION CYST EXCISION Right 01/13/2017   Procedure: REMOVAL GANGLION OF WRIST;  Surgeon: Iran Planas, MD;  Location: [place];  Service: Orthopedics;  Laterality: Right;  . KNEE ARTHROSCOPY Left ~ 2015  . VAGINAL HYSTERECTOMY  2006   Family History:  Family History  Problem Relation Age of Onset  . Diabetes Mother   .  Hypertension Mother   . Ovarian cancer Mother   . Stomach cancer Maternal Uncle   . Heart disease Sister   . Healthy Brother   . Asthma Son   . Asthma Daughter   . Colon cancer Maternal Uncle 36  . Colon polyps Neg Hx   . Esophageal cancer Neg Hx   . Rectal cancer Neg Hx    Social History:  Patient lives with husband, son and daughter-in-law, and 4 grandchildren, two of whom were recently sick with a 24 hour virus that consisted of nausea, vomiting, and cough. No flu shot this year. Patient smokes cigarrettes daily (1/2 PPD about 30 years). Denies daily alcohol and other illicit drug use.  Review of Systems: A complete ROS was negative except as per HPI.   Physical Exam: Blood pressure 110/72, pulse (!) 103, temperature 99.3 F (37.4 C), temperature source Oral, resp. rate (!) 24, SpO2 97 %.  Physical Exam  Constitutional:  Diaphoretic, laying uncomfortably in bed but in no acute distress.  HENT:  Dry oropharynx. Erythema of posterior oropharynx without exudates noted. No frontal sinus or maxillary tenderness noted.  Eyes: Conjunctivae and EOM are normal. Pupils are equal, round, and reactive to light.  Cardiovascular:  Tachycardic. No murmurs appreciated. 2+ radial and posterior tibial pulses bilaterally.   Respiratory:  No accessory muscle use or nasal flaring. No wheezing or crackles appreciated.  GI: Soft. Bowel sounds are normal. She exhibits no distension. There is no tenderness.  Musculoskeletal: She exhibits no edema (of bilateral lower extremities) or tenderness (of bilateral lower extremities).  Lymphadenopathy:    She has no cervical adenopathy.  Skin: Skin is warm and dry. No rash noted. No erythema.   CXR: personally reviewed my interpretation is no signs of pulmonary infiltration or vascular congestion. No pleural effusions noted. Cardiac silhouette within normal limits.   Assessment & Plan by Problem:  Active Problems:   Influenza A  April Hudson is a 47  yo with PMH of frequent UTI's and uterine fibroids s/p hysterectomy who presents for evaluation of acute onset of fever, cough, and nausea that started the evening prior to presentation. Upon arrival to the ED she met sepsis criteria and was given broad spectrum antibiotics. She was found to be influenza A positive without signs or symptoms of other bacterial infection. The patient was admitted to the internal medicine teaching service for management. The specific problems addressed during admission were as follows.  Influenza A: Patient received broad spectrum antibiotics in ED but does not have signs of infection on urinalysis or chest imaging. Will discontinue antibiotics in absence of clear bacterial infection and in light of positive influenza A result. Patient initially hypotensive with possible AKI on arrival and elevated lactate. She was given 2L NS bolus with repeat lactatic acid within normal limits. Patient  received Tamiflu in ED and will need to continue this treatment, 75 mg BID for total of 5 days. Will continue to provide supportive care for nausea, vomiting while inpatient and encourage PO intake, provide maintenance fluids. Will also repeat labs in AM.  -Tamiflu 75 mg BID 5 days -Zofran 4 mg q6 hours PRN -Tylenol 650 mg PRN for fever -Encourage PO intake -Repeat BMP in AM  FEN/GI: -Regular diet, advance as tolerated -NS with 40 mEq K+ @ 100 ml/hr   VTE Prophylaxis: Lovenox daily Code Status: Full  Dispo: Admit patient to Observation with expected length of stay less than 2 midnights.  SignedThomasene Ripple, MD 10/23/2017, 1:19 PM  Pager: [phone]

## 2017-10-23 NOTE — ED Notes (Signed)
PJ, RN 5W, aware admitting MD to change bed status back to M-S.

## 2017-10-24 DIAGNOSIS — Z90710 Acquired absence of both cervix and uterus: Secondary | ICD-10-CM | POA: Diagnosis not present

## 2017-10-24 DIAGNOSIS — Z8742 Personal history of other diseases of the female genital tract: Secondary | ICD-10-CM | POA: Diagnosis not present

## 2017-10-24 DIAGNOSIS — Z87440 Personal history of urinary (tract) infections: Secondary | ICD-10-CM | POA: Diagnosis not present

## 2017-10-24 DIAGNOSIS — J09X2 Influenza due to identified novel influenza A virus with other respiratory manifestations: Secondary | ICD-10-CM | POA: Diagnosis not present

## 2017-10-24 LAB — BASIC METABOLIC PANEL
ANION GAP: 9 (ref 5–15)
BUN: <5 mg/dL — ABNORMAL LOW (ref 6–20)
CO2: 21 mmol/L — AB (ref 22–32)
CREATININE: 0.92 mg/dL (ref 0.44–1.00)
Calcium: 8.3 mg/dL — ABNORMAL LOW (ref 8.9–10.3)
Chloride: 109 mmol/L (ref 101–111)
GFR calc non Af Amer: >60 mL/min (ref >60)
GLUCOSE: 85 mg/dL (ref 65–99)
Potassium: 3.9 mmol/L (ref 3.5–5.1)
Sodium: 139 mmol/L (ref 135–145)

## 2017-10-24 LAB — PROCALCITONIN: Procalcitonin: <0.1 ng/mL

## 2017-10-24 LAB — HIV ANTIBODY (ROUTINE TESTING W REFLEX): HIV Screen 4th Generation wRfx: NONREACTIVE

## 2017-10-24 MED ORDER — ONDANSETRON 4 MG PO TBDP: 4.0000 mg | ORAL_TABLET | Freq: Three times a day (TID) | ORAL | 0 refills | Status: DC | PRN

## 2017-10-24 MED ORDER — OSELTAMIVIR PHOSPHATE 75 MG PO CAPS: 75.0000 mg | ORAL_CAPSULE | Freq: Two times a day (BID) | ORAL | 0 refills | Status: DC

## 2017-10-24 MED ORDER — ONDANSETRON 4 MG PO TBDP: 4.0000 mg | ORAL_TABLET | Freq: Three times a day (TID) | ORAL | Status: DC | PRN

## 2017-10-24 NOTE — Research (Signed)
Title: A Randomized, Double-Blind, Placebo-Controlled Dose Ranging Study Evaluating the Safety Pharmacokinetics and Clinical Benefit of FLU-IGIV in Hospitalized Patients with Serious Influenza A infection. IA-001 (ClinicalTrials.gov Identifier: [id number], Protocol No: IA-001, [facility] Protocol #[id number])  RESEARCH SUBJECT. This research study is sponsored by [employer]   Protocol: amendment 4 -> 24 Feb 2017  The investigational product is called NP-025 aka FLU-IGIV or anti-influenza immune globulin intravenous. It is produced from source plasma collected from [place] Transport planner (FDA) licensed plasma collection establishments from healthy donors who have recovered from influenza (convalescent) and/or were vaccinated against seasonal influenza strains. The plasma contains a relatively high concentration of polyclonal antibodies directed against seasonal influenza strains, specifically influenza A strains H1N1 ([place]) and H3N2 ([place]). It is a glycoprotein of 150-160 kilodaltons against Hemagluttinin (HA) and Neuraminidase (NA) surface proteins.  ..................................................................................................Marland Kitchen  Met with April Hudson [birth year] to follow-up on her discussion with Dr. Chase Caller yesterday regarding her participation in the above mentioned study. The subject was ambulating around the room upon my arrival. She stated interested in participating, however she has not had a chance to read the consent form because she is waiting on her spouse to bring her glasses. I then spoke with the patients nurse, April Hudson, and she was just advised by the resident that the subject will be discharged this afternoon. Due to the pending discharge this subject cannot be enrolled into the above mentioned study. PI, Dr. Chase Caller is aware.   April Hudson, [place], [place], [place]

## 2017-10-24 NOTE — Progress Notes (Addendum)
   Subjective:  Patient seen laying comfortably in bed in no acute distress. Patient states she still feels bad this AM, with her headache being the main thing that bothered her overnight. She states that she vomited in the ED yesterday but was able to eat dinner (meatloaf) last night and drink water throughout the night without difficulty.  Objective:  Vital signs in last 24 hours: Vitals:   10/23/17 2028 10/23/17 2117 10/24/17 0509 10/24/17 0646  BP: 90/70  96/63   Pulse: 98  (!) 104   Resp:   18   Temp: 100.1 F (37.8 C) 99 F (37.2 C) (!) 100.5 F (38.1 C) (!) 100.4 F (38 C)  TempSrc: Oral Oral Oral Oral  SpO2: 100%  99%    Physical Exam  Constitutional:  Patient laying comfortably in bed this AM in no acute distress. No diaphoresis.  HENT:  Mouth/Throat: Oropharynx is clear and moist.  Cardiovascular: Normal rate, regular rhythm and intact distal pulses. Exam reveals no friction rub.  No murmur heard. Respiratory:  No accessory muscle use or nasal flaring. No crackles or wheezing appreciated.  GI: Soft. Bowel sounds are normal. She exhibits no distension. There is no tenderness. There is no rebound.  Musculoskeletal: She exhibits no edema (of bilateral lower extremities) or tenderness (of bilateral lower extremities).  Lymphadenopathy:    She has no cervical adenopathy.  Skin: Skin is warm and dry. No erythema.   Assessment/Plan:  Active Problems:   Influenza A  April Hudson is a 47 yo with PMH of frequent UTI's and uterine fibroids s/p hysterectomy who presented for evaluation of acute onset of fever, cough, and nausea that started the evening prior to presentation. She was found to be influenza A positive and started on Tamiflu on admission. She was admitted to the internal medicine teaching service for management. The specific problems addressed during admission were as follows.  Influenza A: Patient continued to have low grade fevers overnight, T max 38.1  since arrival to the floor from ED (febrile in the ED but responsive to Tylenol). Patient BP continues to be around 90-100s/60-70s, even with >2L fluid. Suspect soft BP at baseline. Repeat BMP this AM with Cr trending towards baseline <1.0 with IVF overnight. Patient tolerating PO without difficulty, therefore will discontinue IVF this AM. Patient will plan to have breakfast and lunch today. If tolerating this without difficulty, then will be safe for discharge to home/self-care with return precautions for work. -Tamiflu 75 mg BID for total of 5 days of therapy -Oral Zofran 4 mg q8 hours PRN -Tylenol 650 mg PRN for fever -Encourage PO intake  FEN/GI: -Regular diet, advance as tolerated -No IVF, encourage PO intake  Dispo: Anticipated discharge in approximately today pending reevaluation in afternoon after eating breakfast.  Thomasene Ripple, MD 10/24/2017, 7:57 AM Pager: [phone]

## 2017-10-24 NOTE — Care Management Note (Signed)
Case Management Note  Patient Details  Name: April Hudson MRN: [id number] Date of Birth: [birth year]  Subjective/Objective:                    Action/Plan: Pt discharging home with self care. Pt has PCP, insurance and transportation home. No further needs per CM.  Expected Discharge Date:  10/24/17               Expected Discharge Plan:  Home/Self Care  In-House Referral:     Discharge planning Services     Post Acute Care Choice:    Choice offered to:     DME Arranged:    DME Agency:     HH Arranged:    HH Agency:     Status of Service:  Completed, signed off  If discussed at [employer] of Stay Meetings, dates discussed:    Additional Comments:  Pollie Friar, RN 10/24/2017, 2:16 PM

## 2017-10-24 NOTE — Progress Notes (Signed)
Patient ride has arrived for transport home. Discharge instructions reviewed and patient verbalized understanding via teach back method. Prescrioption faxed to pharmacy. Patient provided a note for work and mask for protection in the community. Patient has cell phone, clothing, and blanket with her. Room check for personal belongings.

## 2017-10-24 NOTE — Discharge Summary (Signed)
Name: April Hudson MRN: [id number] DOB: [birth year] 47 y.o. PCP: Boykin Nearing, MD  Date of Admission: 10/23/2017  8:56 AM Date of Discharge: 10/24/2017 Attending Physician: Aldine Contes, MD  Discharge Diagnosis: Active Problems:   Influenza A  Discharge Medications: Allergies as of 10/24/2017      Reactions   Sulfa Antibiotics Hives, Rash      Medication List    STOP taking these medications   phenazopyridine 200 MG tablet Commonly known as:  PYRIDIUM     TAKE these medications   gabapentin 300 MG capsule Commonly known as:  NEURONTIN Take 1 capsule (300 mg total) by mouth 3 (three) times daily.   ibuprofen 200 MG tablet Commonly known as:  ADVIL,MOTRIN Take 200 mg by mouth every 6 (six) hours as needed.   ondansetron 4 MG disintegrating tablet Commonly known as:  ZOFRAN-ODT Take 1 tablet (4 mg total) by mouth every 8 (eight) hours as needed for nausea or vomiting.   oseltamivir 75 MG capsule Commonly known as:  TAMIFLU Take 1 capsule (75 mg total) by mouth 2 (two) times daily.       Disposition and follow-up:   Ms.April Hudson was discharged from [facility] in Stable condition.  At the hospital follow up visit please address:  1.  Patient admitted with acute onset fever, cough, congestion, nausea/vomiting, and malaise. She was found to be influenza A positive and supportive care was provided during admission. She was tolerating an oral diet without difficulty at time of discharge and given prescription for Tamiflu to take BID for total of 5 days. Please assess for symptom improvement.   2.  Labs / imaging needed at time of follow-up: None  3.  Pending labs/ test needing follow-up: Blood cultures x2, HIV, Legionella Pneumonie Antigen  Follow-up Appointments: Follow-up Information    Boykin Nearing, MD. Schedule an appointment as soon as possible for a visit in 1 week(s).   Specialty:  Family Medicine Why:  Please follow up  with your PCP within 1 week to make sure that your symptoms are gradually improving. Please call this doctor if you have any problems or further concerns regarding your medications or hospitalization. Contact information: [place] [address] [address] [place] Hospital Course by problem list: Active Problems:   Influenza A   April Hudson is a 47 yo with PMH of frequent UTI's and uterine fibroids s/p hysterectomy who presents for evaluation of acute onset of fever, cough, and nausea that started the evening prior to presentation. Upon arrival to the ED she met sepsis criteria given fever, tachycardia, hypotension, and elevated lactic acid on admission labs. She was given broad spectrum antibiotics and admitted to the internal medicine teaching service for management. The specific problems addressed during admission were as follows.  Influenza A: Since the patient met sepsis criteria on admission, she received broad spectrum antibiotics in ED. Upon evaluation of her workup, she did not have signs of baceterial infection on urinalysis or chest imaging. She did, however, have influenza testing that returned positive for Influenza A virus by PCR. Given no clear evidence of bacterial infection and + viral testing, antibiotics were discontinued on admission and patient was started on Tamiflu 75 mg BID. She had an elevated lactate in ED and was given 2L NS bolus. Her repeat lactic acid was within normal limits after this bolus. Overnight the patient was given IV maintenance fluids and her diet  was advanced as tolerated. By the next day (day of discharge) she was tolerating an oral diet without difficulty. Patient discharged with prescription for Tamiflu 75 mg BID for total of 5 days. Patient also told to stay out of contact with children and elderly until afebrile and to wear a mask to protect these patient populations in the meantime. Patient also given short course of Zofran  to assist with intermittent nausea. Please assess resolution of symptoms at follow up.  Discharge Vitals:   BP 96/63 (BP Location: Right Arm)   Pulse (!) 104   Temp 98.5 F (36.9 C)   Resp 18   SpO2 99%   Pertinent Labs, Studies, and Procedures:  BMP Latest Ref Rng & Units 10/24/2017 10/23/2017 02/26/2017  Glucose 65 - 99 mg/dL 85 133(H) 100(H)  BUN 6 - 20 mg/dL <5(L) 6 7  Creatinine 0.44 - 1.00 mg/dL 0.92 1.02(H) 0.79  Sodium 135 - 145 mmol/L 139 137 135  Potassium 3.5 - 5.1 mmol/L 3.9 3.3(L) 3.4(L)  Chloride 101 - 111 mmol/L 109 105 104  CO2 22 - 32 mmol/L 21(L) 19(L) 23  Calcium 8.9 - 10.3 mg/dL 8.3(L) 9.1 8.8(L)   CBC Latest Ref Rng & Units 10/23/2017 02/26/2017 01/13/2017  WBC 4.0 - 10.5 K/uL 5.1 9.5 -  Hemoglobin 12.0 - 15.0 g/dL 13.5 13.7 15.8(H)  Hematocrit 36.0 - 46.0 % 40.0 40.3 -  Platelets 150 - 400 K/uL 181 176 -   Influenza panel by PCR (type A &B) = Influenza A positive Lactic acid = 2.97 on admission, 0.39 after 2L NS bolus Strep Pneumoniae Urinary Antigen = negative  Urinalysis    Component Value Date/Time   COLORURINE YELLOW 10/23/2017 0640   APPEARANCEUR CLEAR 10/23/2017 0640   LABSPEC 1.023 10/23/2017 0640   PHURINE 5.0 10/23/2017 0640   GLUCOSEU NEGATIVE 10/23/2017 0640   HGBUR LARGE (A) 10/23/2017 0640   BILIRUBINUR NEGATIVE 10/23/2017 0640   BILIRUBINUR negative 04/14/2015 1205   KETONESUR NEGATIVE 10/23/2017 0640   PROTEINUR NEGATIVE 10/23/2017 0640   UROBILINOGEN 0.2 04/14/2015 1205   NITRITE NEGATIVE 10/23/2017 0640   LEUKOCYTESUR NEGATIVE 10/23/2017 0640   Chest X Ray, 2 view: FINDINGS: Lung volumes are normal. No consolidative airspace disease. No pleural effusions. No pneumothorax. No pulmonary nodule or mass noted. Pulmonary vasculature and the cardiomediastinal silhouette are within normal limits.  IMPRESSION: No radiographic evidence of acute cardiopulmonary disease.   Discharge Instructions: Discharge Instructions    Call MD  for:  difficulty breathing, headache or visual disturbances   Complete by:  As directed    Call MD for:  persistant nausea and vomiting   Complete by:  As directed    Call MD for:  temperature >100.4   Complete by:  As directed    Diet - low sodium heart healthy   Complete by:  As directed    Discharge instructions   Complete by:  As directed    You were evaluated for acute onset cough, congestion, headache, and fever. You were found to have the flu virus. You will need to take Tamiflu, 75 mg twice a day for the next 4 days. This medication will help shorten the duration of your infection. To also help with your symptoms of nausea, please take Zofran as prescribed. I also recommend taking Tylenol (650 mg, every 6 hours) or Ibuprofen (600 mg every 6 hours) to help with your headache, fever, and congestion. Please follow up with your primary care physician within 1 week of discharge to  assess resolution of your symptoms.   We have written you a work note to explain to your employer that you are to stay out of work through this week. Please remain out of work until you are at least fever free for 24 hours to help limit the spread of the flu virus. Please wear a mask whenever you are around young children and elderly, especially if you have a fever. This will also help limit the spread of the flu virus to loved once within your household.   Increase activity slowly   Complete by:  As directed       Signed: Thomasene Ripple, MD 10/24/2017, 1:20 PM   Pager: [phone]

## 2017-10-25 LAB — LEGIONELLA PNEUMOPHILA SEROGP 1 UR AG: L. PNEUMOPHILA SEROGP 1 UR AG: NEGATIVE

## 2017-10-28 LAB — CULTURE, BLOOD (ROUTINE X 2)
CULTURE: NO GROWTH
Culture: NO GROWTH
SPECIAL REQUESTS: ADEQUATE
Special Requests: ADEQUATE

## 2017-10-31 ENCOUNTER — Encounter: Payer: Self-pay | Admitting: Physician Assistant

## 2017-10-31 ENCOUNTER — Ambulatory Visit: Payer: BLUE CROSS/BLUE SHIELD | Attending: Internal Medicine | Admitting: Physician Assistant

## 2017-10-31 VITALS — BP 98/64 | HR 76 | Temp 97.9°F | Ht 61.5 in | Wt 147.2 lb

## 2017-10-31 DIAGNOSIS — R5381 Other malaise: Secondary | ICD-10-CM | POA: Insufficient documentation

## 2017-10-31 DIAGNOSIS — J101 Influenza due to other identified influenza virus with other respiratory manifestations: Secondary | ICD-10-CM | POA: Insufficient documentation

## 2017-10-31 DIAGNOSIS — Z87440 Personal history of urinary (tract) infections: Secondary | ICD-10-CM | POA: Diagnosis not present

## 2017-10-31 DIAGNOSIS — J069 Acute upper respiratory infection, unspecified: Secondary | ICD-10-CM | POA: Insufficient documentation

## 2017-10-31 DIAGNOSIS — Z79899 Other long term (current) drug therapy: Secondary | ICD-10-CM | POA: Insufficient documentation

## 2017-10-31 DIAGNOSIS — G5600 Carpal tunnel syndrome, unspecified upper limb: Secondary | ICD-10-CM | POA: Diagnosis not present

## 2017-10-31 DIAGNOSIS — A0472 Enterocolitis due to Clostridium difficile, not specified as recurrent: Secondary | ICD-10-CM | POA: Diagnosis not present

## 2017-10-31 DIAGNOSIS — R05 Cough: Secondary | ICD-10-CM | POA: Insufficient documentation

## 2017-10-31 DIAGNOSIS — D259 Leiomyoma of uterus, unspecified: Secondary | ICD-10-CM | POA: Insufficient documentation

## 2017-10-31 DIAGNOSIS — R509 Fever, unspecified: Secondary | ICD-10-CM | POA: Diagnosis not present

## 2017-10-31 MED ORDER — AZITHROMYCIN 250 MG PO TABS: ORAL_TABLET | ORAL | 0 refills | Status: DC

## 2017-10-31 NOTE — Progress Notes (Signed)
Chief Complaint: "hospital follow up"  Subjective: A 47 year old female with a history of chronic UTIs, fibroids, C. difficile colitis, carpal tunnel syndrome who he has not seen since December 2017. She was hospitalized January 20- 21, 2019 with fever, cough, nausea, vomiting and general malaise. She has to positive for influenza virus type VIII. She also met sepsis criteria and was admitted to the hospital for 24 hours. She received supportive care. She was discharged on Tamiflu 75 mg twice daily for 5 days. She has completed this. At discharge she had an HIV test, Legionella test, and blood cultures that were pending. These have all turned up negative.  Today she states that she's feeling a lot better but still having cough. Because of the cough she sore her rib cage and her chest. She has green sputum production that has developed in the last 24 hours. She has a lot of nasal drainage. She is unable to work today. She continues to smoke. Compliant with her medications.   ROS:  GEN: + fever or chills, denies change in weight Skin: denies lesions or rashes HEENT: denies headache, earache, epistaxis, sore throat, or neck pain LUNGS: + SHOB, dyspnea, PND, orthopnea CV: denies CP or palpitations ABD: denies abd pain, N or V EXT: denies muscle spasms or swelling; no pain in lower ext, no weakness NEURO: denies numbness or tingling, denies sz, stroke or TIA   Objective:  Vitals:   10/31/17 0830  BP: 98/64  Pulse: 76  Temp: 97.9 F (36.6 C)  TempSrc: Oral  SpO2: 97%  Weight: 147 lb 3.2 oz (66.8 kg)  Height: 5' 1.5" (1.562 m)  PF: 76 L/min    Physical Exam:  General: in no acute distress. HEENT: no pallor, no icterus, moist oral mucosa, no JVD, no lymphadenopathy; cerumen left ear and slightly injected Heart: Normal  s1 &s2  Regular rate and rhythm, without murmurs, rubs, gallops. Lungs: Clear to auscultation bilaterally. Neuro: Alert, awake, oriented x3, nonfocal.  Pertinent Lab  Results:reviewed hospital labs   Medications: Prior to Admission medications   Medication Sig Start Date End Date Taking? Authorizing Provider  gabapentin (NEURONTIN) 300 MG capsule Take 1 capsule (300 mg total) by mouth 3 (three) times daily. 11/22/16  Yes Patel, Donika K, DO  ibuprofen (ADVIL,MOTRIN) 200 MG tablet Take 200 mg by mouth every 6 (six) hours as needed.   Yes [provider]  ondansetron (ZOFRAN-ODT) 4 MG disintegrating tablet Take 1 tablet (4 mg total) by mouth every 8 (eight) hours as needed for nausea or vomiting. 10/24/17  Yes Nedrud, Larena Glassman, MD  oseltamivir (TAMIFLU) 75 MG capsule Take 1 capsule (75 mg total) by mouth 2 (two) times daily. 10/24/17  Yes Thomasene Ripple, MD  azithromycin (ZITHROMAX) 250 MG tablet 2 tabs today then 1 tablet daily therafter 10/31/17   Brayton Caves, PA-C    Assessment: 1. URI 2. Influenza A 3. Smoker   Plan: Has completed Tamiflu Add Zpack Smoking cessation discussed Hydrate, supportive cate  Follow up:4 weeks for routine health maintenance Work note provided  The patient was given clear instructions to go to ER or return to medical center if symptoms don't improve, worsen or new problems develop. The patient verbalized understanding. The patient was told to call to get lab results if they haven't heard anything in the next week.   This note has been created with Surveyor, quantity. Any transcriptional errors are unintentional.   Zettie Pho, PA-C 10/31/2017, 8:50 AM

## 2017-10-31 NOTE — Progress Notes (Signed)
Chest feels heavy and patients states she is miseable

## 2017-10-31 NOTE — Patient Instructions (Signed)
Work on cutting back even more the cigarettes Get some rest Stay hydrated

## 2017-11-30 ENCOUNTER — Encounter: Payer: Self-pay | Admitting: Nurse Practitioner

## 2017-11-30 ENCOUNTER — Ambulatory Visit: Payer: Self-pay | Attending: Nurse Practitioner | Admitting: Nurse Practitioner

## 2017-11-30 VITALS — BP 110/78 | HR 85 | Temp 98.4°F | Ht 61.0 in | Wt 153.4 lb

## 2017-11-30 DIAGNOSIS — Z87440 Personal history of urinary (tract) infections: Secondary | ICD-10-CM | POA: Insufficient documentation

## 2017-11-30 DIAGNOSIS — Z8249 Family history of ischemic heart disease and other diseases of the circulatory system: Secondary | ICD-10-CM | POA: Insufficient documentation

## 2017-11-30 DIAGNOSIS — K5909 Other constipation: Secondary | ICD-10-CM | POA: Insufficient documentation

## 2017-11-30 DIAGNOSIS — H538 Other visual disturbances: Secondary | ICD-10-CM | POA: Insufficient documentation

## 2017-11-30 DIAGNOSIS — R1030 Lower abdominal pain, unspecified: Secondary | ICD-10-CM

## 2017-11-30 DIAGNOSIS — Z8711 Personal history of peptic ulcer disease: Secondary | ICD-10-CM | POA: Insufficient documentation

## 2017-11-30 DIAGNOSIS — A419 Sepsis, unspecified organism: Secondary | ICD-10-CM

## 2017-11-30 DIAGNOSIS — M1990 Unspecified osteoarthritis, unspecified site: Secondary | ICD-10-CM | POA: Insufficient documentation

## 2017-11-30 DIAGNOSIS — Z825 Family history of asthma and other chronic lower respiratory diseases: Secondary | ICD-10-CM | POA: Insufficient documentation

## 2017-11-30 DIAGNOSIS — Z86010 Personal history of colonic polyps: Secondary | ICD-10-CM | POA: Insufficient documentation

## 2017-11-30 DIAGNOSIS — Z882 Allergy status to sulfonamides status: Secondary | ICD-10-CM | POA: Insufficient documentation

## 2017-11-30 DIAGNOSIS — Z8041 Family history of malignant neoplasm of ovary: Secondary | ICD-10-CM | POA: Insufficient documentation

## 2017-11-30 DIAGNOSIS — Z79899 Other long term (current) drug therapy: Secondary | ICD-10-CM | POA: Insufficient documentation

## 2017-11-30 DIAGNOSIS — Z833 Family history of diabetes mellitus: Secondary | ICD-10-CM | POA: Insufficient documentation

## 2017-11-30 DIAGNOSIS — G44229 Chronic tension-type headache, not intractable: Secondary | ICD-10-CM | POA: Insufficient documentation

## 2017-11-30 DIAGNOSIS — Z90710 Acquired absence of both cervix and uterus: Secondary | ICD-10-CM | POA: Insufficient documentation

## 2017-11-30 MED ORDER — SUMATRIPTAN SUCCINATE 50 MG PO TABS: 50.0000 mg | ORAL_TABLET | Freq: Once | ORAL | 0 refills | Status: DC

## 2017-11-30 MED ORDER — DOCUSATE SODIUM 100 MG PO CAPS: 100.0000 mg | ORAL_CAPSULE | Freq: Two times a day (BID) | ORAL | 1 refills | Status: AC

## 2017-11-30 MED ORDER — DICYCLOMINE HCL 10 MG PO CAPS
10.0000 mg | ORAL_CAPSULE | Freq: Three times a day (TID) | ORAL | 0 refills | Status: DC
Start: 2017-11-30 — End: 2018-02-03

## 2017-11-30 NOTE — Progress Notes (Signed)
Assessment & Plan:  April Hudson was seen today for establish care and constipation.  Diagnoses and all orders for this visit:  Chronic constipation -     docusate sodium (COLACE) 100 MG capsule; Take 1 capsule (100 mg total) by mouth 2 (two) times daily. -     dicyclomine (BENTYL) 10 MG capsule; Take 1 capsule (10 mg total) by mouth 4 (four) times daily -  before meals and at bedtime. Increase water intake   Chronic tension-type headache, not intractable -     SUMAtriptan (IMITREX) 50 MG tablet; Take 1 tablet (50 mg total) by mouth once for 1 dose. May repeat in 2 hours if headache persists or recurs. STOP SMOKING   Lower abdominal pain -     dicyclomine (BENTYL) 10 MG capsule; Take 1 capsule (10 mg total) by mouth 4 (four) times daily -  before meals and at bedtime.  Sepsis, due to unspecified organism [facility]) RESOLVED   Patient has been counseled on age-appropriate routine health concerns for screening and prevention. These are reviewed and up-to-date. Referrals have been placed accordingly. Immunizations are up-to-date or declined.    Subjective:   Chief Complaint  Patient presents with  . Establish Care    Patient is here to establish care.   . Constipation    Patient stated she have trouble having bowl movements for a while. Have not been gassy. Tried OTC stool softner, no help.  Lower stomach hurts.    [name] 47 y.o. female presents to office today to establish care as new patient.   Constipation She has a history of Cdiff colitis and TVA colon polyps.  Patient complains of chronic constipation.  Stool pattern has been 1 firm and pellet like stool(s) every few days. Onset was chronic with worsening over the past few days . ago Defecation has been difficult and incomplete. Symptoms have been gradually worsening. Current Health Habits: Eating fiber? no change Exercise?no Water intake? yes, amt 32oz per day. Current OTC/RX therapy has been OTC laxative (she can  not recall the name) which has been not very effective. She drinks 32oz of water. She has tried miralax in the past with little relief. She endorses lower abdominal pain 6-7/10. Describes the pain as crampy. She does state that she took 8 laxatives yesterday. Reports she was given a bottle of laxatives by her daughter in law and took 8 of those but she can not recall the name on the bottle.  Denies melena/hematochezia.  Headaches She has a history of right temple abscess/cyst which was surgically removed 02-2016. Today she endorses headaches in the same area. States "Sometimes my head just pounds over here". Taking 400mg  ibuprofen sparingly due to history of gastric ulcers (nonbleeding).  Also reports intermittent blurred vision in her right eye which occurs with the headaches. Visual deficits only last for a few seconds. Headaches can last 2-3 days. She denies nausea or vomiting.   Review of Systems  Constitutional: Negative for fever, malaise/fatigue and weight loss.  HENT: Negative.  Negative for nosebleeds.   Eyes: Positive for blurred vision. Negative for double vision and photophobia.  Respiratory: Negative.  Negative for cough and shortness of breath.   Cardiovascular: Negative.  Negative for chest pain, palpitations and leg swelling.  Gastrointestinal: Positive for abdominal pain and constipation. Negative for diarrhea, heartburn, nausea and vomiting.  Genitourinary: Negative.  Negative for dysuria, flank pain, frequency, hematuria and urgency.  Musculoskeletal: Positive for joint pain (left wrist/hx of carpel tunnel and  right ganglion cyst removal). Negative for myalgias.  Neurological: Positive for headaches. Negative for dizziness, focal weakness and seizures.  Endo/Heme/Allergies: Negative for environmental allergies.  Psychiatric/Behavioral: Negative.  Negative for suicidal ideas.    Past Medical History:  Diagnosis Date  . Carpal tunnel syndrome of right wrist   . Ganglion cyst of  wrist, right   . History of adenomatous polyp of colon    tubular adenoma's 09/ 2016;  06/ 2017  . History of Clostridium difficile colitis 05/21/2016  . History of frequent urinary tract infections   . History of gastric ulcer    clinical diagnosis, no prior EGD  . History of uterine fibroid   . Migraines   . OA (osteoarthritis)     Past Surgical History:  Procedure Laterality Date  . CARPAL TUNNEL RELEASE Right 01/13/2017   Procedure: CARPAL TUNNEL RELEASE;  Surgeon: Iran Planas, MD;  Location: [place];  Service: Orthopedics;  Laterality: Right;  . COLONOSCOPY  last one 01/ 03/ 2018   sigmoid scope  . GANGLION CYST EXCISION Right 01/13/2017   Procedure: REMOVAL GANGLION OF WRIST;  Surgeon: Iran Planas, MD;  Location: [place];  Service: Orthopedics;  Laterality: Right;  . KNEE ARTHROSCOPY Left ~ 2015  . VAGINAL HYSTERECTOMY  2006    Family History  Problem Relation Age of Onset  . Diabetes Mother   . Hypertension Mother   . Ovarian cancer Mother   . Stomach cancer Maternal Uncle   . Heart disease Sister   . Healthy Brother   . Asthma Son   . Asthma Daughter   . Colon cancer Maternal Uncle 12  . Colon polyps Neg Hx   . Esophageal cancer Neg Hx   . Rectal cancer Neg Hx     Social History Reviewed with no changes to be made today.   Outpatient Medications Prior to Visit  Medication Sig Dispense Refill  . azithromycin (ZITHROMAX) 250 MG tablet 2 tabs today then 1 tablet daily therafter (Patient not taking: Reported on 11/30/2017) 6 tablet 0  . gabapentin (NEURONTIN) 300 MG capsule Take 1 capsule (300 mg total) by mouth 3 (three) times daily. (Patient not taking: Reported on 11/30/2017) 90 capsule 5  . ibuprofen (ADVIL,MOTRIN) 200 MG tablet Take 200 mg by mouth every 6 (six) hours as needed.    . ondansetron (ZOFRAN-ODT) 4 MG disintegrating tablet Take 1 tablet (4 mg total) by mouth every 8 (eight) hours as needed for nausea or vomiting.  (Patient not taking: Reported on 11/30/2017) 20 tablet 0  . oseltamivir (TAMIFLU) 75 MG capsule Take 1 capsule (75 mg total) by mouth 2 (two) times daily. (Patient not taking: Reported on 11/30/2017) 8 capsule 0   No facility-administered medications prior to visit.     Allergies  Allergen Reactions  . Sulfa Antibiotics Hives and Rash       Objective:    BP 110/78 (BP Location: Left Arm, Patient Position: Sitting, Cuff Size: Normal)   Pulse 85   Temp 98.4 F (36.9 C) (Oral)   Ht 5\' 1"  (1.549 m)   Wt 153 lb 6.4 oz (69.6 kg)   SpO2 97%   BMI 28.98 kg/m  Wt Readings from Last 3 Encounters:  11/30/17 153 lb 6.4 oz (69.6 kg)  10/31/17 147 lb 3.2 oz (66.8 kg)  04/28/17 167 lb (75.8 kg)    Physical Exam  Constitutional: She is oriented to person, place, and time. She appears well-developed and well-nourished. She is cooperative.  HENT:  Head: Normocephalic and atraumatic.  Eyes: EOM are normal.  Neck: Normal range of motion.  Cardiovascular: Normal rate, regular rhythm and normal heart sounds. Exam reveals no gallop and no friction rub.  No murmur heard. Pulmonary/Chest: Effort normal and breath sounds normal. No tachypnea. She has no decreased breath sounds. She has no rhonchi. She exhibits no tenderness.  Abdominal: Soft. Bowel sounds are normal. She exhibits distension. She exhibits no mass. There is tenderness (with palpation of lower abdomen. States "If feels like I need to have a bowel movement".). There is no rebound and no guarding.  Musculoskeletal: Normal range of motion. She exhibits no edema.  Neurological: She is alert and oriented to person, place, and time. Coordination normal.  Skin: Skin is warm and dry.  Psychiatric: She has a normal mood and affect. Her behavior is normal. Judgment and thought content normal.  Nursing note and vitals reviewed.      Patient has been counseled extensively about nutrition and exercise as well as the importance of adherence with  medications and regular follow-up. The patient was given clear instructions to go to ER or return to medical center if symptoms don't improve, worsen or new problems develop. The patient verbalized understanding.   Follow-up: Return in about 4 weeks (around 12/28/2017) for Needs appointment with financial representative.[name], FNP-BC [facility] and [facility], [place]   11/30/2017, 1:21 PM

## 2017-11-30 NOTE — Patient Instructions (Addendum)

## 2017-12-12 ENCOUNTER — Ambulatory Visit: Payer: Self-pay | Attending: Nurse Practitioner

## 2017-12-26 ENCOUNTER — Ambulatory Visit: Payer: Self-pay | Admitting: Nurse Practitioner

## 2018-02-03 ENCOUNTER — Ambulatory Visit: Payer: Self-pay | Attending: Nurse Practitioner | Admitting: Nurse Practitioner

## 2018-02-03 ENCOUNTER — Encounter: Payer: Self-pay | Admitting: Nurse Practitioner

## 2018-02-03 DIAGNOSIS — K5909 Other constipation: Secondary | ICD-10-CM | POA: Insufficient documentation

## 2018-02-03 DIAGNOSIS — G44229 Chronic tension-type headache, not intractable: Secondary | ICD-10-CM | POA: Insufficient documentation

## 2018-02-03 DIAGNOSIS — R1030 Lower abdominal pain, unspecified: Secondary | ICD-10-CM | POA: Insufficient documentation

## 2018-02-03 DIAGNOSIS — Z882 Allergy status to sulfonamides status: Secondary | ICD-10-CM | POA: Insufficient documentation

## 2018-02-03 MED ORDER — DICYCLOMINE HCL 10 MG PO CAPS: 10.0000 mg | ORAL_CAPSULE | Freq: Three times a day (TID) | ORAL | 2 refills | Status: DC

## 2018-02-03 MED ORDER — SUMATRIPTAN SUCCINATE 50 MG PO TABS: 50.0000 mg | ORAL_TABLET | Freq: Once | ORAL | 0 refills | Status: DC

## 2018-02-03 MED FILL — SUMATRIPTAN SUCC 50 MG TAB: 50 | 30 days supply | Qty: 9 | Fill #0

## 2018-02-03 MED FILL — DICYCLOMINE 10 MG CAPSULE: 10 | 15 days supply | Qty: 60 | Fill #0

## 2018-02-03 NOTE — Progress Notes (Signed)
Assessment & Plan:  April Hudson was seen today for follow-up and cyst.  Diagnoses and all orders for this visit:  Chronic tension-type headache, not intractable -     SUMAtriptan (IMITREX) 50 MG tablet; Take 1 tablet (50 mg total) by mouth once for 1 dose. May repeat in 2 hours if headache persists or recurs. Avoid migraine triggers: STOP SMOKING; caffeine, wine, alcohol, cheese, MSG   Chronic constipation -     dicyclomine (BENTYL) 10 MG capsule; Take 1 capsule (10 mg total) by mouth 4 (four) times daily -  before meals and at bedtime. Drink at least 48oz of water per day.   Lower abdominal pain -     dicyclomine (BENTYL) 10 MG capsule; Take 1 capsule (10 mg total) by mouth 4 (four) times daily -  before meals and at bedtime.    Patient has been counseled on age-appropriate routine health concerns for screening and prevention. These are reviewed and up-to-date. Referrals have been placed accordingly. Immunizations are up-to-date or declined.    Subjective:   Chief Complaint  Patient presents with  . Follow-up    Pt. is here for a follow-up on constipation. Pt. stated it is better, still a little pain on her abdobmen.   . Cyst    Pt. stated she is concern with the knot on the side of her right face.    [name] 47 y.o. female presents to office today for follow up to abdominal pain and constipation. She was started on bentyl at her previous office visit and reports significant improvement in abdominal pain as well as constipation. Will continue on bentyl at this time.   Headaches Chronic. Persistent. I ordered imitrex at her last office appointment however she reports the pharmacy she tried to have imitrex refilled was too expensive so she was not able to pick it up. She is still concerned about the area on her right temple where she previously had an cyst removed a few years ago. There is still a small tender papular nodule in the area. She is waiting on the financial  assistance program in order to be seen by general surgery. Other symptoms with headaches include: blurred vision. Pain is described as sharp, aching, stabbing. Duration: minutes-hours-days. She has been taking ibuprofen although she is aware of the risk of GIB.   Review of Systems  Constitutional: Negative for fever, malaise/fatigue and weight loss.  HENT: Negative.  Negative for nosebleeds.   Eyes: Positive for blurred vision. Negative for double vision and photophobia.  Respiratory: Negative.  Negative for cough and shortness of breath.   Cardiovascular: Negative.  Negative for chest pain, palpitations and leg swelling.  Gastrointestinal: Negative.  Negative for heartburn, nausea and vomiting.  Musculoskeletal: Negative.  Negative for myalgias.  Neurological: Positive for headaches. Negative for dizziness, focal weakness and seizures.  Psychiatric/Behavioral: Negative.  Negative for suicidal ideas.    Past Medical History:  Diagnosis Date  . Carpal tunnel syndrome of right wrist   . Ganglion cyst of wrist, right   . History of adenomatous polyp of colon    tubular adenoma's 09/ 2016;  06/ 2017  . History of Clostridium difficile colitis 05/21/2016  . History of frequent urinary tract infections   . History of gastric ulcer    clinical diagnosis, no prior EGD  . History of uterine fibroid   . Migraines   . OA (osteoarthritis)     Past Surgical History:  Procedure Laterality Date  . CARPAL TUNNEL  RELEASE Right 01/13/2017   Procedure: CARPAL TUNNEL RELEASE;  Surgeon: Iran Planas, MD;  Location: [facility];  Service: Orthopedics;  Laterality: Right;  . COLONOSCOPY  last one 01/ 03/ 2018   sigmoid scope  . GANGLION CYST EXCISION Right 01/13/2017   Procedure: REMOVAL GANGLION OF WRIST;  Surgeon: Iran Planas, MD;  Location: [place];  Service: Orthopedics;  Laterality: Right;  . KNEE ARTHROSCOPY Left ~ 2015  . VAGINAL HYSTERECTOMY  2006    Family  History  Problem Relation Age of Onset  . Diabetes Mother   . Hypertension Mother   . Ovarian cancer Mother   . Stomach cancer Maternal Uncle   . Heart disease Sister   . Healthy Brother   . Asthma Son   . Asthma Daughter   . Colon cancer Maternal Uncle 25  . Colon polyps Neg Hx   . Esophageal cancer Neg Hx   . Rectal cancer Neg Hx     Social History Reviewed with no changes to be made today.   Outpatient Medications Prior to Visit  Medication Sig Dispense Refill  . dicyclomine (BENTYL) 10 MG capsule Take 1 capsule (10 mg total) by mouth 4 (four) times daily -  before meals and at bedtime. 60 capsule 0  . SUMAtriptan (IMITREX) 50 MG tablet Take 1 tablet (50 mg total) by mouth once for 1 dose. May repeat in 2 hours if headache persists or recurs. 10 tablet 0   No facility-administered medications prior to visit.     Allergies  Allergen Reactions  . Sulfa Antibiotics Hives and Rash       Objective:    BP 103/72 (BP Location: Left Arm, Patient Position: Sitting, Cuff Size: Normal)   Pulse 88   Temp 99.3 F (37.4 C) (Oral)   Ht 5\' 1"  (1.549 m)   Wt 142 lb (64.4 kg)   SpO2 99%   BMI 26.83 kg/m  Wt Readings from Last 3 Encounters:  02/03/18 142 lb (64.4 kg)  11/30/17 153 lb 6.4 oz (69.6 kg)  10/31/17 147 lb 3.2 oz (66.8 kg)    Physical Exam  Constitutional: She is oriented to person, place, and time. She appears well-developed and well-nourished. She is cooperative.  HENT:  Head: Normocephalic and atraumatic.    Eyes: EOM are normal.  Neck: Normal range of motion.  Cardiovascular: Normal rate, regular rhythm and normal heart sounds. Exam reveals no gallop and no friction rub.  No murmur heard. Pulmonary/Chest: Effort normal and breath sounds normal. No tachypnea. No respiratory distress. She has no decreased breath sounds. She has no wheezes. She has no rhonchi. She has no rales. She exhibits no tenderness.  Abdominal: Soft. Bowel sounds are normal.    Musculoskeletal: Normal range of motion. She exhibits no edema.  Neurological: She is alert and oriented to person, place, and time. Coordination normal.  Skin: Skin is warm and dry.  Psychiatric: She has a normal mood and affect. Her behavior is normal. Judgment and thought content normal.  Nursing note and vitals reviewed.     Patient has been counseled extensively about nutrition and exercise as well as the importance of adherence with medications and regular follow-up. The patient was given clear instructions to go to ER or return to medical center if symptoms don't improve, worsen or new problems develop. The patient verbalized understanding.   Follow-up: Return in about 6 weeks (around 03/17/2018) for please let patient know about scheduling with finanical counselor.  [name], FNP-BC [facility] and [facility], [place]   02/03/2018, 6:19 PM

## 2018-02-03 NOTE — Patient Instructions (Signed)
 Coping with Quitting Smoking Quitting smoking is a physical and mental challenge. You will face cravings, withdrawal symptoms, and temptation. Before quitting, work with your health care provider to make a plan that can help you cope. Preparation can help you quit and keep you from giving in. How can I cope with cravings? Cravings usually last for 5-10 minutes. If you get through it, the craving will pass. Consider taking the following actions to help you cope with cravings:  Keep your mouth busy: ? Chew sugar-free gum. ? Suck on hard candies or a straw. ? Brush your teeth.  Keep your hands and body busy: ? Immediately change to a different activity when you feel a craving. ? Squeeze or play with a ball. ? Do an activity or a hobby, like making bead jewelry, practicing needlepoint, or working with wood. ? Mix up your normal routine. ? Take a short exercise break. Go for a quick walk or run up and down stairs. ? Spend time in public places where smoking is not allowed.  Focus on doing something kind or helpful for someone else.  Call a friend or family member to talk during a craving.  Join a support group.  Call a quit line, such as 1-800-QUIT-NOW.  Talk with your health care provider about medicines that might help you cope with cravings and make quitting easier for you.  How can I deal with withdrawal symptoms? Your body may experience negative effects as it tries to get used to not having nicotine in the system. These effects are called withdrawal symptoms. They may include:  Feeling hungrier than normal.  Trouble concentrating.  Irritability.  Trouble sleeping.  Feeling depressed.  Restlessness and agitation.  Craving a cigarette.  To manage withdrawal symptoms:  Avoid places, people, and activities that trigger your cravings.  Remember why you want to quit.  Get plenty of sleep.  Avoid coffee and other caffeinated drinks. These may worsen some of your  symptoms.  How can I handle social situations? Social situations can be difficult when you are quitting smoking, especially in the first few weeks. To manage this, you can:  Avoid parties, bars, and other social situations where people might be smoking.  Avoid alcohol.  Leave right away if you have the urge to smoke.  Explain to your family and friends that you are quitting smoking. Ask for understanding and support.  Plan activities with friends or family where smoking is not an option.  What are some ways I can cope with stress? Wanting to smoke may cause stress, and stress can make you want to smoke. Find ways to manage your stress. Relaxation techniques can help. For example:  Breathe slowly and deeply, in through your nose and out through your mouth.  Listen to soothing, relaxing music.  Talk with a family member or friend about your stress.  Light a candle.  Soak in a bath or take a shower.  Think about a peaceful place.  What are some ways I can prevent weight gain? Be aware that many people gain weight after they quit smoking. However, not everyone does. To keep from gaining weight, have a plan in place before you quit and stick to the plan after you quit. Your plan should include:  Having healthy snacks. When you have a craving, it may help to: ? Eat plain popcorn, crunchy carrots, celery, or other cut vegetables. ? Chew sugar-free gum.  Changing how you eat: ? Eat small portion sizes at meals. ?   Eat 4-6 small meals throughout the day instead of 1-2 large meals a day. ? Be mindful when you eat. Do not watch television or do other things that might distract you as you eat.  Exercising regularly: ? Make time to exercise each day. If you do not have time for a long workout, do short bouts of exercise for 5-10 minutes several times a day. ? Do some form of strengthening exercise, like weight lifting, and some form of aerobic exercise, like running or  swimming.  Drinking plenty of water or other low-calorie or no-calorie drinks. Drink 6-8 glasses of water daily, or as much as instructed by your health care provider.  Summary  Quitting smoking is a physical and mental challenge. You will face cravings, withdrawal symptoms, and temptation to smoke again. Preparation can help you as you go through these challenges.  You can cope with cravings by keeping your mouth busy (such as by chewing gum), keeping your body and hands busy, and making calls to family, friends, or a helpline for people who want to quit smoking.  You can cope with withdrawal symptoms by avoiding places where people smoke, avoiding drinks with caffeine, and getting plenty of rest.  Ask your health care provider about the different ways to prevent weight gain, avoid stress, and handle social situations. This information is not intended to replace advice given to you by your health care provider. Make sure you discuss any questions you have with your health care provider. Document Released: 09/17/2016 Document Revised: 09/17/2016 Document Reviewed: 09/17/2016 Elsevier Interactive Patient Education  2018 Elsevier Inc.    Health Risks of Smoking Smoking cigarettes is very bad for your health. Tobacco smoke has over 200 known poisons in it. It contains the poisonous gases nitrogen oxide and carbon monoxide. There are over 60 chemicals in tobacco smoke that cause cancer. Smoking is difficult to quit because a chemical in tobacco, called nicotine, causes addiction or dependence. When you smoke and inhale, nicotine is absorbed rapidly into the bloodstream through your lungs. Both inhaled and non-inhaled nicotine may be addictive. What are the risks of cigarette smoke? Cigarette smokers have an increased risk of many serious medical problems, including:  Lung cancer.  Lung disease, such as pneumonia, bronchitis, and emphysema.  Chest pain (angina) and heart attack because the  heart is not getting enough oxygen.  Heart disease and peripheral blood vessel disease.  High blood pressure (hypertension).  Stroke.  Oral cancer, including cancer of the lip, mouth, or voice box.  Bladder cancer.  Pancreatic cancer.  Cervical cancer.  Pregnancy complications, including premature birth.  Stillbirths and smaller newborn babies, birth defects, and genetic damage to sperm.  Early menopause.  Lower estrogen level for women.  Infertility.  Facial wrinkles.  Blindness.  Increased risk of broken bones (fractures).  Senile dementia.  Stomach ulcers and internal bleeding.  Delayed wound healing and increased risk of complications during surgery.  Even smoking lightly shortens your life expectancy by several years.  Because of secondhand smoke exposure, children of smokers have an increased risk of the following:  Sudden infant death syndrome (SIDS).  Respiratory infections.  Lung cancer.  Heart disease.  Ear infections.  What are the benefits of quitting? There are many health benefits of quitting smoking. Here are some of them:  Within days of quitting smoking, your risk of having a heart attack decreases, your blood flow improves, and your lung capacity improves. Blood pressure, pulse rate, and breathing patterns start returning to   normal soon after quitting.  Within months, your lungs may clear up completely.  Quitting for 10 years reduces your risk of developing lung cancer and heart disease to almost that of a nonsmoker.  People who quit may see an improvement in their overall quality of life.  How do I quit smoking? Smoking is an addiction with both physical and psychological effects, and longtime habits can be hard to change. Your health care provider can recommend:  Programs and community resources, which may include group support, education, or talk therapy.  Prescription medicines to help reduce cravings.  Nicotine replacement  products, such as patches, gum, and nasal sprays. Use these products only as directed. Do not replace cigarette smoking with electronic cigarettes, which are commonly called e-cigarettes. The safety of e-cigarettes is not known, and some may contain harmful chemicals.  A combination of two or more of these methods.  Where to find more information:  American Lung Association: www.lung.org  American Cancer Society: www.cancer.org Summary  Smoking cigarettes is very bad for your health. Cigarette smokers have an increased risk of many serious medical problems, including several cancers, heart disease, and stroke.  Smoking is an addiction with both physical and psychological effects, and longtime habits can be hard to change.  By stopping right away, you can greatly reduce the risk of medical problems for you and your family.  To help you quit smoking, your health care provider can recommend programs, community resources, prescription medicines, and nicotine replacement products such as patches, gum, and nasal sprays. This information is not intended to replace advice given to you by your health care provider. Make sure you discuss any questions you have with your health care provider. Document Released: 10/28/2004 Document Revised: 09/24/2016 Document Reviewed: 09/24/2016 Elsevier Interactive Patient Education  2017 Elsevier Inc.   

## 2018-03-17 ENCOUNTER — Ambulatory Visit: Payer: Self-pay

## 2018-03-17 ENCOUNTER — Encounter: Payer: Self-pay | Admitting: Nurse Practitioner

## 2018-03-17 ENCOUNTER — Ambulatory Visit: Payer: Self-pay | Attending: Nurse Practitioner | Admitting: Nurse Practitioner

## 2018-03-17 VITALS — BP 107/75 | HR 80 | Temp 98.4°F | Ht 61.0 in | Wt 147.6 lb

## 2018-03-17 DIAGNOSIS — G44229 Chronic tension-type headache, not intractable: Secondary | ICD-10-CM

## 2018-03-17 DIAGNOSIS — K5909 Other constipation: Secondary | ICD-10-CM

## 2018-03-17 DIAGNOSIS — M25532 Pain in left wrist: Secondary | ICD-10-CM

## 2018-03-17 MED ORDER — BUTALBITAL-APAP-CAFFEINE 50-325-40 MG PO TABS: 1.0000 | ORAL_TABLET | Freq: Four times a day (QID) | ORAL | 1 refills | Status: AC | PRN

## 2018-03-17 MED ORDER — SENNA-DOCUSATE SODIUM 8.6-50 MG PO TABS: 1.0000 | ORAL_TABLET | Freq: Every day | ORAL | 1 refills | Status: DC

## 2018-03-17 MED ORDER — NAPROXEN 500 MG PO TABS: 500.0000 mg | ORAL_TABLET | Freq: Two times a day (BID) | ORAL | 0 refills | Status: DC

## 2018-03-17 MED FILL — BUTALB-ACETAMIN-CAFF 50-325: 50-325-40 | 8 days supply | Qty: 60 | Fill #0

## 2018-03-17 MED FILL — NAPROXEN 500 MG TABLET: 500 | 30 days supply | Qty: 60 | Fill #0

## 2018-03-17 NOTE — Patient Instructions (Signed)
Migraine Headache A migraine headache is an intense, throbbing pain on one side or both sides of the head. Migraines may also cause other symptoms, such as nausea, vomiting, and sensitivity to light and noise. What are the causes? Doing or taking certain things may also trigger migraines, such as:  Alcohol.  Smoking.  Medicines, such as: ? Medicine used to treat chest pain (nitroglycerine). ? Birth control pills. ? Estrogen pills. ? Certain blood pressure medicines.  Aged cheeses, chocolate, or caffeine.  Foods or drinks that contain nitrates, glutamate, aspartame, or tyramine.  Physical activity.  Other things that may trigger a migraine include:  Menstruation.  Pregnancy.  Hunger.  Stress, lack of sleep, too much sleep, or fatigue.  Weather changes.  What increases the risk? The following factors may make you more likely to experience migraine headaches:  Age. Risk increases with age.  Family history of migraine headaches.  Being Caucasian.  Depression and anxiety.  Obesity.  Being a woman.  Having a hole in the heart (patent foramen ovale) or other heart problems.  What are the signs or symptoms? The main symptom of this condition is pulsating or throbbing pain. Pain may:  Happen in any area of the head, such as on one side or both sides.  Interfere with daily activities.  Get worse with physical activity.  Get worse with exposure to bright lights or loud noises.  Other symptoms may include:  Nausea.  Vomiting.  Dizziness.  General sensitivity to bright lights, loud noises, or smells.  Before you get a migraine, you may get warning signs that a migraine is developing (aura). An aura may include:  Seeing flashing lights or having blind spots.  Seeing bright spots, halos, or zigzag lines.  Having tunnel vision or blurred vision.  Having numbness or a tingling feeling.  Having trouble talking.  Having muscle weakness.  How is this  diagnosed? A migraine headache can be diagnosed based on:  Your symptoms.  A physical exam.  Tests, such as CT scan or MRI of the head. These imaging tests can help rule out other causes of headaches.  Taking fluid from the spine (lumbar puncture) and analyzing it (cerebrospinal fluid analysis, or CSF analysis).  How is this treated? A migraine headache is usually treated with medicines that:  Relieve pain.  Relieve nausea.  Prevent migraines from coming back.  Treatment may also include:  Acupuncture.  Lifestyle changes like avoiding foods that trigger migraines.  Follow these instructions at home: Medicines  Take over-the-counter and prescription medicines only as told by your health care provider.  Do not drive or use heavy machinery while taking prescription pain medicine.  To prevent or treat constipation while you are taking prescription pain medicine, your health care provider may recommend that you: ? Drink enough fluid to keep your urine clear or pale yellow. ? Take over-the-counter or prescription medicines. ? Eat foods that are high in fiber, such as fresh fruits and vegetables, whole grains, and beans. ? Limit foods that are high in fat and processed sugars, such as fried and sweet foods. Lifestyle  Avoid alcohol use.  Do not use any products that contain nicotine or tobacco, such as cigarettes and e-cigarettes. If you need help quitting, ask your health care provider.  Get at least 8 hours of sleep every night.  Limit your stress. General instructions   Keep a journal to find out what may trigger your migraine headaches. For example, write down: ? What you eat and   drink. ? How much sleep you get. ? Any change to your diet or medicines.  If you have a migraine: ? Avoid things that make your symptoms worse, such as bright lights. ? It may help to lie down in a dark, quiet room. ? Do not drive or use heavy machinery. ? Ask your health care provider  what activities are safe for you while you are experiencing symptoms.  Keep all follow-up visits as told by your health care provider. This is important. Contact a health care provider if:  You develop symptoms that are different or more severe than your usual migraine symptoms. Get help right away if:  Your migraine becomes severe.  You have a fever.  You have a stiff neck.  You have vision loss.  Your muscles feel weak or like you cannot control them.  You start to lose your balance often.  You develop trouble walking.  You faint. This information is not intended to replace advice given to you by your health care provider. Make sure you discuss any questions you have with your health care provider. Document Released: 09/20/2005 Document Revised: 04/09/2016 Document Reviewed: 03/08/2016 Elsevier Interactive Patient Education  2017 Elsevier Inc.   

## 2018-03-17 NOTE — Progress Notes (Signed)
Assessment & Plan:  April Hudson was seen today for follow-up.  Diagnoses and all orders for this visit:  Chronic tension-type headache, not intractable -     butalbital-acetaminophen-caffeine (FIORICET, ESGIC) 50-325-40 MG tablet; Take 1-2 tablets by mouth every 6 (six) hours as needed for headache.  Left wrist pain -     naproxen (NAPROSYN) 500 MG tablet; Take 1 tablet (500 mg total) by mouth 2 (two) times daily with a meal. Wear a left wrist splint every day while awake  Other constipation -     sennosides-docusate sodium (SENOKOT-S) 8.6-50 MG tablet; Take 1 tablet by mouth daily.    Patient has been counseled on age-appropriate routine health concerns for screening and prevention. These are reviewed and up-to-date. Referrals have been placed accordingly. Immunizations are up-to-date or declined.    Subjective:   Chief Complaint  Patient presents with  . Follow-up    [name], hard to go, but loose stool. Pt. stated she have pain on both of her hand and they are swollen.    [name] 47 y.o. female presents to office today for follow up to chronic headaches.    Left Wrist Pain Chronic. Ongoing for 1.5 years and just worsening over the past several months.  Patient presents for presents evaluation of pain in hands and possible carpal tunnel syndrome.  Onset of the symptoms was several years ago. Current symptoms include Numbness in tips of fingers of right and left hand. Inciting event/aggravating factors: repetitive activity: working in [employer] Patient's course of PJ:[id number] worsening. 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 The left hand has not been tested. and orthopedics evaluation: .Marland Kitchen  Treatment to date: right hand carpel tunnel release surgery.   CONSTIPATION She is endorsing  increased constipation over the past several weeks. Stools are initially hard then loose once she passes the hard stool.  Co-Morbid conditions:culprit medication: Bentyl and current medication Bentyl. Symptoms have been symptoms have progressed to a point and plateaued. Current Health Habits: Eating fiber? somewhat Exercise?no Water intake? 3-4 bottles a day minimum. Current OTC/RX therapy has been none    Chronic Headaches She was prescribed imitrex at her last office visit with me on 02-03-2018 which she reports provided no relief of her headaches. Reports headaches feel like a tight band around her forehead. Duration of headaches minutes to hours. She has also tried ibuprofen which provided little relief of symptoms. Will try butalbital today. She still has not applied for the financial assistance so referrals and imaging are on hold at this time.    Review of Systems  Constitutional: Negative for fever, malaise/fatigue and weight loss.  HENT: Negative.  Negative for nosebleeds.   Eyes: Negative.  Negative for blurred vision, double vision and photophobia.  Respiratory: Negative.  Negative for cough and shortness of breath.   Cardiovascular: Negative.  Negative for chest pain, palpitations and leg swelling.  Gastrointestinal: Positive for constipation. Negative for blood in stool, heartburn, melena, nausea and vomiting.  Musculoskeletal: Positive for joint pain. Negative for myalgias.  Neurological: Positive for headaches. Negative for dizziness, sensory change, speech change, focal weakness and seizures.  Psychiatric/Behavioral: Negative.  Negative for suicidal ideas.    Past Medical History:  Diagnosis Date  . Carpal tunnel syndrome of right wrist   . Ganglion cyst of wrist, right   . History of adenomatous polyp  of colon    tubular adenoma's 09/ 2016;  06/ 2017  . History of Clostridium difficile colitis 05/21/2016  . History of frequent urinary tract infections   . History of gastric  ulcer    clinical diagnosis, no prior EGD  . History of uterine fibroid   . Migraines   . OA (osteoarthritis)     Past Surgical History:  Procedure Laterality Date  . CARPAL TUNNEL RELEASE Right 01/13/2017   Procedure: CARPAL TUNNEL RELEASE;  Surgeon: Iran Planas, MD;  Location: [place];  Service: Orthopedics;  Laterality: Right;  . COLONOSCOPY  last one 01/ 03/ 2018   sigmoid scope  . GANGLION CYST EXCISION Right 01/13/2017   Procedure: REMOVAL GANGLION OF WRIST;  Surgeon: Iran Planas, MD;  Location: [place];  Service: Orthopedics;  Laterality: Right;  . KNEE ARTHROSCOPY Left ~ 2015  . VAGINAL HYSTERECTOMY  2006    Family History  Problem Relation Age of Onset  . Diabetes Mother   . Hypertension Mother   . Ovarian cancer Mother   . Stomach cancer Maternal Uncle   . Heart disease Sister   . Healthy Brother   . Asthma Son   . Asthma Daughter   . Colon cancer Maternal Uncle 45  . Colon polyps Neg Hx   . Esophageal cancer Neg Hx   . Rectal cancer Neg Hx     Social History Reviewed with no changes to be made today.   Outpatient Medications Prior to Visit  Medication Sig Dispense Refill  . dicyclomine (BENTYL) 10 MG capsule Take 1 capsule (10 mg total) by mouth 4 (four) times daily -  before meals and at bedtime. 60 capsule 2  . SUMAtriptan (IMITREX) 50 MG tablet Take 1 tablet (50 mg total) by mouth once for 1 dose. May repeat in 2 hours if headache persists or recurs. 30 tablet 0   No facility-administered medications prior to visit.     Allergies  Allergen Reactions  . Sulfa Antibiotics Hives and Rash       Objective:    BP 107/75 (BP Location: Right Arm, Patient Position: Sitting, Cuff Size: Normal)   Pulse 80   Temp 98.4 F (36.9 C) (Oral)   Ht 5\' 1"  (1.549 m)   Wt 147 lb 9.6 oz (67 kg)   SpO2 97%   BMI 27.89 kg/m  Wt Readings from Last 3 Encounters:  03/17/18 147 lb 9.6 oz (67 kg)  02/03/18 142 lb (64.4 kg)    11/30/17 153 lb 6.4 oz (69.6 kg)    Physical Exam  Constitutional: She is oriented to person, place, and time. She appears well-developed and well-nourished. She is cooperative.  HENT:  Head: Normocephalic and atraumatic.  Eyes: EOM are normal.  Neck: Normal range of motion.  Cardiovascular: Normal rate, regular rhythm and normal heart sounds. Exam reveals no gallop and no friction rub.  No murmur heard. Pulmonary/Chest: Effort normal and breath sounds normal. No tachypnea. No respiratory distress. She has no decreased breath sounds. She has no wheezes. She has no rhonchi. She has no rales. She exhibits no tenderness.  Abdominal: Soft. Bowel sounds are normal. She exhibits no distension and no mass. There is no tenderness. There is no rebound and no guarding. No hernia.  Musculoskeletal: Normal range of motion. She exhibits no edema.       Left wrist: She exhibits tenderness. She exhibits no bony tenderness and no swelling.       Left hand:  She exhibits normal range of motion, no tenderness, normal capillary refill, no deformity and no swelling.  Neurological: She is alert and oriented to person, place, and time. Coordination normal.  Skin: Skin is warm and dry.  Psychiatric: She has a normal mood and affect. Her behavior is normal. Judgment and thought content normal.  Nursing note and vitals reviewed.      Patient has been counseled extensively about nutrition and exercise as well as the importance of adherence with medications and regular follow-up. The patient was given clear instructions to go to ER or return to medical center if symptoms don't improve, worsen or new problems develop. The patient verbalized understanding.   Follow-up: Return in about 1 month (around 04/14/2018) for headaches.   [name], FNP-BC [facility] [place], [place]   03/17/2018, 10:12 PM

## 2018-03-22 ENCOUNTER — Telehealth: Payer: Self-pay | Admitting: Nurse Practitioner

## 2018-03-22 NOTE — Telephone Encounter (Signed)
Patient stopped by the office today to inform pcp that she had received her blue card and had completed all the paperwork you all had talked about in your visit for referrals.

## 2018-03-28 ENCOUNTER — Telehealth: Payer: Self-pay | Admitting: Nurse Practitioner

## 2018-03-28 NOTE — Telephone Encounter (Signed)
I spoke with Pt, sending a copy of the  [employer] discount letter today and she has the blue card already

## 2018-03-28 NOTE — Telephone Encounter (Signed)
Patient called and requested fro an update on her cafa, oc, & bc. Patient was informed and verbalized understanding that the patient was approved for the blue card. Please fu at your earliest convenience.

## 2018-03-30 ENCOUNTER — Telehealth: Payer: Self-pay | Admitting: Nurse Practitioner

## 2018-03-30 NOTE — Telephone Encounter (Signed)
Patient wanted to inform pcp that she has received her oc, and blue card.

## 2018-04-05 NOTE — Telephone Encounter (Signed)
Will route to PCP 

## 2018-04-05 NOTE — Telephone Encounter (Signed)
Patient called stating that she has received her OC and her PCP had told her that she would refer her out to the Architect. Please f/u

## 2018-04-10 ENCOUNTER — Other Ambulatory Visit: Payer: Self-pay | Admitting: Nurse Practitioner

## 2018-04-10 ENCOUNTER — Telehealth: Payer: Self-pay | Admitting: *Deleted

## 2018-04-10 ENCOUNTER — Ambulatory Visit: Payer: Self-pay | Attending: Nurse Practitioner | Admitting: *Deleted

## 2018-04-10 DIAGNOSIS — Z111 Encounter for screening for respiratory tuberculosis: Secondary | ICD-10-CM

## 2018-04-10 DIAGNOSIS — G43509 Persistent migraine aura without cerebral infarction, not intractable, without status migrainosus: Secondary | ICD-10-CM

## 2018-04-10 DIAGNOSIS — Z1211 Encounter for screening for malignant neoplasm of colon: Secondary | ICD-10-CM

## 2018-04-10 NOTE — Telephone Encounter (Signed)
Neurology referral has been placed as well as gastroenterology for repeat colonoscopy. Please call patient regarding GYN referral. Not sure of the reason she needs a GYN referral. Thank you

## 2018-04-10 NOTE — Progress Notes (Signed)
PPD Placement note April Hudson, 47 y.o. female is here today for placement of PPD test Reason for PPD test: employment Pt taken PPD test before: yes Verified in allergy area and with patient that they are not allergic to the products PPD is made of (Phenol or Tween). Yes Is patient taking any oral or IV steroid medication now or have they taken it in the last month? no Has the patient ever received the BCG vaccine?: no Has the patient been in recent contact with anyone known or suspected of having active TB disease?: no    PPD placed on 04/10/2018.  Patient advised to return for reading within 48-72 hours.

## 2018-04-10 NOTE — Telephone Encounter (Signed)
CMA spoke to patient to inform Neurology and Gastroenterology referral has been placed. Pt. Stated she meant to say Gastroenterology instead of Gynecology.  Pt. Is aware someone will reach out to her 3-4 weeks to schedule an appt.

## 2018-04-10 NOTE — Telephone Encounter (Signed)
Pt wanted came in office to inquire if the referral has been place for Neurology and Gynecology. She has an orange card now.

## 2018-04-10 NOTE — Telephone Encounter (Signed)
NOTED

## 2018-04-11 ENCOUNTER — Encounter: Payer: Self-pay | Admitting: Gastroenterology

## 2018-04-13 ENCOUNTER — Ambulatory Visit: Payer: Self-pay | Attending: Nurse Practitioner

## 2018-04-13 DIAGNOSIS — Z111 Encounter for screening for respiratory tuberculosis: Secondary | ICD-10-CM

## 2018-04-13 DIAGNOSIS — R7611 Nonspecific reaction to tuberculin skin test without active tuberculosis: Secondary | ICD-10-CM | POA: Insufficient documentation

## 2018-04-13 NOTE — Progress Notes (Signed)
Patient came in today for TB reading.   No rise, or swelling on the site that was placed.   A note results was given to patient.

## 2018-04-21 ENCOUNTER — Ambulatory Visit: Payer: Self-pay | Admitting: Nurse Practitioner

## 2018-05-11 ENCOUNTER — Encounter (HOSPITAL_COMMUNITY): Payer: Self-pay | Admitting: *Deleted

## 2018-05-11 ENCOUNTER — Emergency Department (HOSPITAL_COMMUNITY)
Admission: EM | Admit: 2018-05-11 | Discharge: 2018-05-11 | Disposition: A | Discharge status: Home / Self Care | Payer: Self-pay | Attending: Emergency Medicine | Admitting: Emergency Medicine

## 2018-05-11 ENCOUNTER — Emergency Department (HOSPITAL_COMMUNITY): Payer: Self-pay

## 2018-05-11 DIAGNOSIS — R51 Headache: Secondary | ICD-10-CM | POA: Insufficient documentation

## 2018-05-11 DIAGNOSIS — Z79899 Other long term (current) drug therapy: Secondary | ICD-10-CM | POA: Insufficient documentation

## 2018-05-11 DIAGNOSIS — R519 Headache, unspecified: Secondary | ICD-10-CM

## 2018-05-11 DIAGNOSIS — R1033 Periumbilical pain: Secondary | ICD-10-CM | POA: Insufficient documentation

## 2018-05-11 DIAGNOSIS — F17210 Nicotine dependence, cigarettes, uncomplicated: Secondary | ICD-10-CM | POA: Insufficient documentation

## 2018-05-11 LAB — COMPREHENSIVE METABOLIC PANEL
ALT: 15 U/L (ref 0–44)
AST: 22 U/L (ref 15–41)
Albumin: 3.9 g/dL (ref 3.5–5.0)
Alkaline Phosphatase: 70 U/L (ref 38–126)
Anion gap: 8 (ref 5–15)
BUN: 6 mg/dL (ref 6–20)
CO2: 26 mmol/L (ref 22–32)
Calcium: 9.7 mg/dL (ref 8.9–10.3)
Chloride: 109 mmol/L (ref 98–111)
Creatinine, Ser: 0.73 mg/dL (ref 0.44–1.00)
GFR calc Af Amer: >60 mL/min (ref >60)
GFR calc non Af Amer: >60 mL/min (ref >60)
Glucose, Bld: 97 mg/dL (ref 70–99)
Potassium: 3.5 mmol/L (ref 3.5–5.1)
Sodium: 143 mmol/L (ref 135–145)
Total Bilirubin: 0.7 mg/dL (ref 0.3–1.2)
Total Protein: 7.2 g/dL (ref 6.5–8.1)

## 2018-05-11 LAB — CBC WITH DIFFERENTIAL/PLATELET
Abs Immature Granulocytes: 0 10*3/uL (ref 0.0–0.1)
Basophils Absolute: 0.1 10*3/uL (ref 0.0–0.1)
Basophils Relative: 1 %
Eosinophils Absolute: 0.1 10*3/uL (ref 0.0–0.7)
Eosinophils Relative: 1 %
HCT: 43.5 % (ref 36.0–46.0)
Hemoglobin: 14.8 g/dL (ref 12.0–15.0)
Immature Granulocytes: 0 %
Lymphocytes Relative: 37 %
Lymphs Abs: 3.2 10*3/uL (ref 0.7–4.0)
MCH: 32.2 pg (ref 26.0–34.0)
MCHC: 34 g/dL (ref 30.0–36.0)
MCV: 94.6 fL (ref 78.0–100.0)
Monocytes Absolute: 0.7 10*3/uL (ref 0.1–1.0)
Monocytes Relative: 8 %
Neutro Abs: 4.5 10*3/uL (ref 1.7–7.7)
Neutrophils Relative %: 53 %
Platelets: 179 10*3/uL (ref 150–400)
RBC: 4.6 MIL/uL (ref 3.87–5.11)
RDW: 13.2 % (ref 11.5–15.5)
WBC: 8.6 10*3/uL (ref 4.0–10.5)

## 2018-05-11 LAB — LIPASE, BLOOD: Lipase: 42 U/L (ref 11–51)

## 2018-05-11 MED ORDER — SODIUM CHLORIDE 0.9 % IV BOLUS
1000.0000 mL | Freq: Once | INTRAVENOUS | Status: AC
  Administered 2018-05-11: 1000 mL via INTRAVENOUS

## 2018-05-11 MED ORDER — METOCLOPRAMIDE HCL 10 MG PO TABS
5.0000 mg | ORAL_TABLET | Freq: Once | ORAL | Status: AC
  Administered 2018-05-11: 5 mg via ORAL
  Filled 2018-05-11: qty 1

## 2018-05-11 MED ORDER — DIPHENHYDRAMINE HCL 25 MG PO CAPS
25.0000 mg | ORAL_CAPSULE | Freq: Once | ORAL | Status: AC
  Administered 2018-05-11: 25 mg via ORAL
  Filled 2018-05-11: qty 1

## 2018-05-11 MED ORDER — KETOROLAC TROMETHAMINE 30 MG/ML IJ SOLN
30.0000 mg | Freq: Once | INTRAMUSCULAR | Status: AC
  Administered 2018-05-11: 30 mg via INTRAVENOUS
  Filled 2018-05-11: qty 1

## 2018-05-11 NOTE — ED Provider Notes (Signed)
[facility] Provider Note   CSN: [id number] Arrival date & time: 05/11/18  0957   History   Chief Complaint Chief Complaint  Patient presents with  . Headache    [name] is a 47 y.o. female.   HPI   47 year old female presents today with several complaints. Patient notes her last months she's had right-sided headache waxing and waning, worse over the last 24 hours. She notes slow onset throbbing aching sensation in the right frontal head. She notes history headaches but notes this feels different. Patient notes taking ibuprofen at home without improvement in her symptoms. She denies any fever, neck stiffness, trauma, neurological deficits or any other red flags. Patient also notes yesterday she had onset of periumbilical abdominal pain and small amount of nonbloody diarrhea. She denies any upper abdominal pain fever or chills. No recent antibiotic exposure.    Past Medical History:  Diagnosis Date  . Carpal tunnel syndrome of right wrist   . Ganglion cyst of wrist, right   . History of adenomatous polyp of colon    tubular adenoma's 09/ 2016;  06/ 2017  . History of Clostridium difficile colitis 05/21/2016  . History of frequent urinary tract infections   . History of gastric ulcer    clinical diagnosis, no prior EGD  . History of uterine fibroid   . Migraines   . OA (osteoarthritis)     Patient Active Problem List   Diagnosis Date Noted  . Influenza A 10/23/2017  . Ganglion cyst of wrist, right 09/16/2016  . Esophageal reflux 05/24/2016  . Anal fissure 10/23/2015  . Epidermoid cyst 09/02/2015  . 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 Tinea pedis 05/26/2015  . Pain of left thumb 05/26/2015  . 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 De Quervain's tenosynovitis, left 04/14/2015  . Neck pain on right side 01/13/2015  . Carpal tunnel syndrome 12/30/2014  . Smoker 07/03/2014  . Family history of  stomach cancer 07/03/2014    Past Surgical History:  Procedure Laterality Date  . CARPAL TUNNEL RELEASE Right 01/13/2017   Procedure: CARPAL TUNNEL RELEASE;  Surgeon: Iran Planas, MD;  Location: [place];  Service: Orthopedics;  Laterality: Right;  . COLONOSCOPY  last one 01/ 03/ 2018   sigmoid scope  . GANGLION CYST EXCISION Right 01/13/2017   Procedure: REMOVAL GANGLION OF WRIST;  Surgeon: Iran Planas, MD;  Location: [place];  Service: Orthopedics;  Laterality: Right;  . KNEE ARTHROSCOPY Left ~ 2015  . VAGINAL HYSTERECTOMY  2006     OB History   None      Home Medications    Prior to Admission medications   Medication Sig Start Date End Date Taking? Authorizing Provider  naproxen (NAPROSYN) 500 MG tablet Take 1 tablet (500 mg total) by mouth 2 (two) times daily with a meal. 03/17/18   [name], NP  sennosides-docusate sodium (SENOKOT-S) 8.6-50 MG tablet Take 1 tablet by mouth daily. 03/17/18   [name], NP    Family History Family History  Problem Relation Age of Onset  . Diabetes Mother   . Hypertension Mother   . Ovarian cancer Mother   . Stomach cancer Maternal Uncle   . Heart disease Sister   . Healthy Brother   . Asthma Son   . Asthma Daughter   . Colon cancer Maternal Uncle 84  . Colon polyps Neg Hx   . Esophageal cancer Neg Hx   .  Rectal cancer Neg Hx     Social History Social History   Tobacco Use  . Smoking status: Current Every Day Smoker    Packs/day: 0.50    Years: 29.00    Pack years: 14.50    Types: Cigarettes  . Smokeless tobacco: Never Used  Substance Use Topics  . Alcohol use: No    Alcohol/week: 0.0 standard drinks  . Drug use: No     Allergies   Sulfa antibiotics   Review of Systems Review of Systems  All other systems reviewed and are negative.   Physical Exam Updated Vital Signs BP 105/85   Pulse 63   Temp 98.6 F (37 C) (Oral)   Resp 18   SpO2 98%   Physical Exam   Constitutional: She is oriented to person, place, and time. She appears well-developed and well-nourished.  HENT:  Head: Normocephalic and atraumatic.  Eyes: Pupils are equal, round, and reactive to light. Conjunctivae are normal. Right eye exhibits no discharge. Left eye exhibits no discharge. No scleral icterus.  Neck: Normal range of motion. No JVD present. No tracheal deviation present.  Pulmonary/Chest: Effort normal. No stridor.  Abdominal:  Minor tenderness to palpation of the periumbilical region, no significant right lower left lower upper abdominal tenderness to palpation  Neurological: She is alert and oriented to person, place, and time. She has normal strength. She is not disoriented. No cranial nerve deficit or sensory deficit. Coordination normal. GCS eye subscore is 4. GCS verbal subscore is 5. GCS motor subscore is 6.  Psychiatric: She has a normal mood and affect. Her behavior is normal. Judgment and thought content normal.  Nursing note and vitals reviewed.    ED Treatments / Results  Labs (all labs ordered are listed, but only abnormal results are displayed) Labs Reviewed  CBC WITH DIFFERENTIAL/PLATELET  COMPREHENSIVE METABOLIC PANEL  LIPASE, BLOOD    EKG None  Radiology Ct Head Wo Contrast  Result Date: 05/11/2018 CLINICAL DATA:  Headache and nausea. EXAM: CT HEAD WITHOUT CONTRAST TECHNIQUE: Contiguous axial images were obtained from the base of the skull through the vertex without intravenous contrast. COMPARISON:  MRA brain dated January 20, 2016. FINDINGS: Brain: No evidence of acute infarction, hemorrhage, hydrocephalus, extra-axial collection or mass lesion/mass effect. Vascular: Atherosclerotic vascular calcification of the carotid siphons. No hyperdense vessel. Skull: Normal. Negative for fracture or focal lesion. Sinuses/Orbits: No acute finding. Mild mucosal thickening of the left sphenoid sinus. Other: None. IMPRESSION: 1. Normal noncontrast head CT.  Electronically Signed   By: Titus Dubin M.D.   On: 05/11/2018 13:05    Procedures Procedures (including critical care time)  Medications Ordered in ED Medications  sodium chloride 0.9 % bolus 1,000 mL (1,000 mLs Intravenous New Bag/Given 05/11/18 1243)  ketorolac (TORADOL) 30 MG/ML injection 30 mg (30 mg Intravenous Given 05/11/18 1219)  metoCLOPramide (REGLAN) tablet 5 mg (5 mg Oral Given 05/11/18 1219)  diphenhydrAMINE (BENADRYL) capsule 25 mg (25 mg Oral Given 05/11/18 1220)     Initial Impression / Assessment and Plan / ED Course  I have reviewed the triage vital signs and the nursing notes.  Pertinent labs & imaging results that were available during my care of the patient were reviewed by me and considered in my medical decision making (see chart for details).     Labs: CBC, CMP, lipase  Imaging:CT head without  Consults:  Therapeutics:Toradol, Reglan, Benadryl  Discharge Meds:   Assessment/Plan: 47 year old female presents today with several complaints. Patient notes headache, probably  one month history, right sided associated neurological deficits red flags. Head CT normal. Symptoms improved with Toradol Reglan and Benadryl. Patient also having her periumbilical abdominal pain, no fever, no elevation in white count, and we tender, low suspicion for acute intra-abdominal pathology. Patient will return if symptoms persist boyond 48 hours or return immediately if they worsen. She verbalized understanding and agreement today's plan had no further questions or concerns.   Final Clinical Impressions(s) / ED Diagnoses   Final diagnoses:  Acute nonintractable headache, unspecified headache type  Periumbilical abdominal pain    ED Discharge Orders    None       Okey Regal, PA-C 05/11/18 [place], Dan, DO 05/11/18 1505

## 2018-05-11 NOTE — ED Triage Notes (Signed)
Pt in c/o headache since yesterday with nausea, states she does not normally get headaches like this, no distress noted

## 2018-05-11 NOTE — Discharge Instructions (Addendum)
Please read attached information. If you experience any new or worsening signs or symptoms please return to the emergency room for evaluation. Please follow-up with your primary care provider or specialist as discussed. Please use medication prescribed only as directed and discontinue taking if you have any concerning signs or symptoms.   °

## 2018-05-19 ENCOUNTER — Encounter: Payer: Self-pay | Admitting: Nurse Practitioner

## 2018-05-19 ENCOUNTER — Ambulatory Visit: Payer: Self-pay | Attending: Nurse Practitioner | Admitting: Nurse Practitioner

## 2018-05-19 VITALS — BP 106/74 | HR 89 | Temp 98.9°F | Ht 61.0 in | Wt 153.0 lb

## 2018-05-19 DIAGNOSIS — Z87440 Personal history of urinary (tract) infections: Secondary | ICD-10-CM | POA: Insufficient documentation

## 2018-05-19 DIAGNOSIS — G44229 Chronic tension-type headache, not intractable: Secondary | ICD-10-CM | POA: Insufficient documentation

## 2018-05-19 DIAGNOSIS — M1990 Unspecified osteoarthritis, unspecified site: Secondary | ICD-10-CM | POA: Insufficient documentation

## 2018-05-19 DIAGNOSIS — Z8719 Personal history of other diseases of the digestive system: Secondary | ICD-10-CM | POA: Insufficient documentation

## 2018-05-19 DIAGNOSIS — Z800 Family history of malignant neoplasm of digestive organs: Secondary | ICD-10-CM | POA: Insufficient documentation

## 2018-05-19 DIAGNOSIS — Z79899 Other long term (current) drug therapy: Secondary | ICD-10-CM | POA: Insufficient documentation

## 2018-05-19 DIAGNOSIS — Z882 Allergy status to sulfonamides status: Secondary | ICD-10-CM | POA: Insufficient documentation

## 2018-05-19 MED ORDER — SUMATRIPTAN SUCCINATE 50 MG PO TABS: ORAL_TABLET | ORAL | 0 refills | Status: DC

## 2018-05-19 MED FILL — SUMAtriptan SUCCINATE 50 MG: 50 | 30 days supply | Qty: 9 | Fill #0

## 2018-05-19 NOTE — Patient Instructions (Signed)
Cluster Headache Cluster headaches are deeply painful. They normally occur on one side of your head, but they may switch sides. Often, cluster headaches:  Are severe.  Happen often for a few weeks or months and then go away for a while.  Last from 15 minutes to 3 hours.  Happen at the same time each day.  Happen at night.  Happen many times a day.  Follow these instructions at home: Follow instructions from your doctor to care for yourself at home:  Go to bed at the same time each night. Get the same amount of sleep every night.  Avoid alcohol.  Stop smoking if you smoke. This includes cigarettes and e-cigarettes.  Take over-the-counter and prescription medicines only as told by your doctor.  Do not drive or use heavy machinery while taking prescription pain medicine.  Use oxygen as told by your doctor.  Exercise regularly.  Eat a healthy diet.  Write down when each headache happened, what kind of pain you had, how bad your pain was, and what you tried to help your pain. This is called a headache diary. Use it as told by your doctor.  Contact a doctor if:  Your headaches get worse or they happen more often.  Your medicines are not helping. Get help right away if:  You pass out (faint).  You get weak or lose feeling (have numbness) on one side of your body or face.  You see two of everything (double vision).  You feel sick to your stomach (nauseous) or you throw up (vomit), and you do not stop after many hours.  You have trouble with your balance or with walking.  You have trouble talking.  You have neck pain or stiffness.  You have a fever. This information is not intended to replace advice given to you by your health care provider. Make sure you discuss any questions you have with your health care provider. Document Released: 10/28/2004 Document Revised: 05/28/2016 Document Reviewed: 05/28/2016 Elsevier Interactive Patient Education  2017 [employer]  Recurrent Migraine Headache A migraine headache is very bad, throbbing pain that is usually on one side of your head. Recurrent migraines keep coming back (recurring). Talk with your doctor about what things may bring on (trigger) your migraine headaches. Follow these instructions at home: Medicines  Take over-the-counter and prescription medicines only as told by your doctor.  Do not drive or use heavy machinery while taking prescription pain medicine. Lifestyle  Do not use any products that contain nicotine or tobacco, such as cigarettes and e-cigarettes. If you need help quitting, ask your doctor.  Limit alcohol intake to no more than 1 drink a day for nonpregnant women and 2 drinks a day for men. One drink equals 12 oz of beer, 5 oz of wine, or 1 oz of hard liquor.  Get 7-9 hours of sleep each night.  Lessen any stress in your life. Ask your doctor about ways to lower your stress.  Stay at a healthy weight. Talk with your doctor if you need help losing weight.  Get regular exercise. General instructions  Keep a journal to find out if certain things bring on migraine headaches. For example, write down: ? What you eat and drink. ? How much sleep you get. ? Any change to your diet or medicines.  Lie down in a dark, quiet room when you have a migraine.  Try placing a cool towel over your head when you have a migraine.  Keep lights dim  if bright lights bother you or make your migraines worse.  Keep all follow-up visits as told by your doctor. This is important. Contact a doctor if:  Medicine does not help your migraines.  Your pain keeps coming back.  You have a fever.  You have weight loss without trying. Get help right away if:  Your migraine becomes really bad and medicine does not help.  You have a stiff neck.  You have trouble seeing.  Your muscles are weak or you lose control of your muscles.  You lose your balance or have trouble walking.  You  feel like you will pass out (faint) or you pass out.  You have really bad symptoms that are different than your first symptoms.  You start having sudden, very bad headaches that last for one second or less, like a thunderclap. Summary  A migraine headache is very bad, throbbing pain that is usually on one side of your head.  Talk with your doctor about what things may bring on (trigger) your migraine headaches.  Take over-the-counter and prescription medicines only as told by your doctor.  Lie down in a dark, quiet room when you have a migraine.  Keep a journal about what you eat and drink, how much sleep you get, and any changes to your medicines. This can help you find out if certain things make you have migraine headaches. This information is not intended to replace advice given to you by your health care provider. Make sure you discuss any questions you have with your health care provider. Document Released: 06/29/2008 Document Revised: 08/13/2016 Document Reviewed: 08/13/2016 Elsevier Interactive Patient Education  2017 [employer].

## 2018-05-19 NOTE — Progress Notes (Signed)
Assessment & Plan:  April Hudson was seen today for follow-up.  Diagnoses and all orders for this visit:  Chronic tension-type headache, not intractable -     SUMAtriptan (IMITREX) 50 MG tablet; Take 1 (one) tablet by mouth at the start of headache. Repeat in 2 hours if headache persists. Do not take more than 2 tablets in 24 hours.    Patient has been counseled on age-appropriate routine health concerns for screening and prevention. These are reviewed and up-to-date. Referrals have been placed accordingly. Immunizations are up-to-date or declined.    Subjective:   Chief Complaint  Patient presents with  . Follow-up    Pt. stated her headache are getting worst.   [name] 47 y.o. female presents to office today for follow up to headaches.   Headaches She was seen in the ED on 05-11-2018 with acute nonintractable headache. She was treated with toradol 30mg , reglan 5mg  and benadryl 25mg  and discharged home in stable condition. Head CT was normal. In the past I have prescribed her naproxen and butalbital for her headaches with little relief of symptoms. She is awaiting neurology appointment next month. Will trial her on sumatriptan at this time. Headaches are described as tight like a band around her forehead with increased pain in the right temporal area. Duration of headaches: hours. Relieving factors: rest, dark room, quiet.   Review of Systems  Constitutional: Negative for fever, malaise/fatigue and weight loss.  HENT: Negative.  Negative for nosebleeds.   Eyes: Negative.  Negative for blurred vision, double vision and photophobia.  Respiratory: Negative.  Negative for cough and shortness of breath.   Cardiovascular: Negative.  Negative for chest pain, palpitations and leg swelling.  Gastrointestinal: Positive for nausea. Negative for heartburn and vomiting.  Musculoskeletal: Positive for joint pain (left wrist pain). Negative for myalgias.  Neurological: Positive for  headaches. Negative for dizziness, focal weakness and seizures.  Psychiatric/Behavioral: Negative.  Negative for suicidal ideas.    Past Medical History:  Diagnosis Date  . Carpal tunnel syndrome of right wrist   . Ganglion cyst of wrist, right   . History of adenomatous polyp of colon    tubular adenoma's 09/ 2016;  06/ 2017  . History of Clostridium difficile colitis 05/21/2016  . History of frequent urinary tract infections   . History of gastric ulcer    clinical diagnosis, no prior EGD  . History of uterine fibroid   . Migraines   . OA (osteoarthritis)     Past Surgical History:  Procedure Laterality Date  . CARPAL TUNNEL RELEASE Right 01/13/2017   Procedure: CARPAL TUNNEL RELEASE;  Surgeon: Iran Planas, MD;  Location: [place];  Service: Orthopedics;  Laterality: Right;  . COLONOSCOPY  last one 01/ 03/ 2018   sigmoid scope  . GANGLION CYST EXCISION Right 01/13/2017   Procedure: REMOVAL GANGLION OF WRIST;  Surgeon: Iran Planas, MD;  Location: [facility];  Service: Orthopedics;  Laterality: Right;  . KNEE ARTHROSCOPY Left ~ 2015  . VAGINAL HYSTERECTOMY  2006    Family History  Problem Relation Age of Onset  . Diabetes Mother   . Hypertension Mother   . Ovarian cancer Mother   . Stomach cancer Maternal Uncle   . Heart disease Sister   . Healthy Brother   . Asthma Son   . Asthma Daughter   . Colon cancer Maternal Uncle 60  . Colon polyps Neg Hx   . Esophageal cancer Neg Hx   .  Rectal cancer Neg Hx     Social History Reviewed with no changes to be made today.   Outpatient Medications Prior to Visit  Medication Sig Dispense Refill  . naproxen (NAPROSYN) 500 MG tablet Take 1 tablet (500 mg total) by mouth 2 (two) times daily with a meal. 60 tablet 0  . sennosides-docusate sodium (SENOKOT-S) 8.6-50 MG tablet Take 1 tablet by mouth daily. 30 tablet 1   No facility-administered medications prior to visit.     Allergies  Allergen  Reactions  . Sulfa Antibiotics Hives and Rash       Objective:    BP 106/74 (BP Location: Left Arm, Patient Position: Sitting, Cuff Size: Normal)   Pulse 89   Temp 98.9 F (37.2 C) (Oral)   Ht 5\' 1"  (1.549 m)   Wt 153 lb (69.4 kg)   SpO2 94%   BMI 28.91 kg/m  Wt Readings from Last 3 Encounters:  05/19/18 153 lb (69.4 kg)  03/17/18 147 lb 9.6 oz (67 kg)  02/03/18 142 lb (64.4 kg)    Physical Exam  Constitutional: She is oriented to person, place, and time. She appears well-developed and well-nourished. She is cooperative.  HENT:  Head: Normocephalic and atraumatic. Head is without right periorbital erythema and without left periorbital erythema.    Right Ear: Hearing normal.  Left Ear: Hearing normal.  Nose: Nose normal. Right sinus exhibits no maxillary sinus tenderness and no frontal sinus tenderness. Left sinus exhibits no maxillary sinus tenderness and no frontal sinus tenderness.  Eyes: EOM are normal.  Neck: Normal range of motion.  Cardiovascular: Normal rate, regular rhythm and normal heart sounds. Exam reveals no gallop and no friction rub.  No murmur heard. Pulmonary/Chest: Effort normal and breath sounds normal. No tachypnea. No respiratory distress. She has no decreased breath sounds. She has no wheezes. She has no rhonchi. She has no rales. She exhibits no tenderness.  Abdominal: Bowel sounds are normal.  Musculoskeletal: Normal range of motion. She exhibits no edema.  Neurological: She is alert and oriented to person, place, and time. She has normal strength. No cranial nerve deficit or sensory deficit. Coordination normal.  Skin: Skin is warm and dry.  Psychiatric: She has a normal mood and affect. Her behavior is normal. Judgment and thought content normal.  Nursing note and vitals reviewed.        Patient has been counseled extensively about nutrition and exercise as well as the importance of adherence with medications and regular follow-up. The patient  was given clear instructions to go to ER or return to medical center if symptoms don't improve, worsen or new problems develop. The patient verbalized understanding.   Follow-up: Return in about 6 weeks (around 06/30/2018) for headache.   [name], FNP-BC [facility] and [facility], [place]   05/19/2018, 4:21 PM

## 2018-05-20 ENCOUNTER — Encounter: Payer: Self-pay | Admitting: Nurse Practitioner

## 2018-06-07 ENCOUNTER — Other Ambulatory Visit: Payer: Self-pay

## 2018-06-07 ENCOUNTER — Ambulatory Visit (AMBULATORY_SURGERY_CENTER): Payer: Self-pay | Admitting: *Deleted

## 2018-06-07 VITALS — Ht 61.0 in | Wt 149.0 lb

## 2018-06-07 DIAGNOSIS — Z86010 Personal history of colonic polyps: Secondary | ICD-10-CM

## 2018-06-07 MED ORDER — BISACODYL 5 MG PO TBEC: 5.0000 mg | DELAYED_RELEASE_TABLET | Freq: Once | ORAL | 0 refills | Status: AC

## 2018-06-07 MED ORDER — PEG 3350-KCL-NA BICARB-NACL 420 G PO SOLR: 4000.0000 mL | Freq: Once | ORAL | 0 refills | Status: AC

## 2018-06-07 MED FILL — PEG-3350 SOLUTION: 420 | 1 days supply | Qty: 4000 | Fill #0

## 2018-06-07 NOTE — Progress Notes (Signed)
No egg or soy allergy known to patient  No issues with past sedation with any surgeries  or procedures, no intubation problems  No diet pills per patient No home 02 use per patient  No blood thinners per patient  No A fib or A flutter  EMMI video offered and declined by the patient. Patient has a BM qod with use of senokot.last colonoscopy used Suprep with excellent results and patient was using senokot at that time.

## 2018-06-13 ENCOUNTER — Ambulatory Visit (INDEPENDENT_AMBULATORY_CARE_PROVIDER_SITE_OTHER): Payer: Self-pay | Admitting: Diagnostic Neuroimaging

## 2018-06-13 ENCOUNTER — Encounter: Payer: Self-pay | Admitting: Diagnostic Neuroimaging

## 2018-06-13 VITALS — BP 106/68 | HR 76 | Ht 64.0 in | Wt 147.4 lb

## 2018-06-13 DIAGNOSIS — G43109 Migraine with aura, not intractable, without status migrainosus: Secondary | ICD-10-CM

## 2018-06-13 MED ORDER — TOPIRAMATE 50 MG PO TABS: 50.0000 mg | ORAL_TABLET | Freq: Two times a day (BID) | ORAL | 12 refills | Status: DC

## 2018-06-13 MED ORDER — RIZATRIPTAN BENZOATE 10 MG PO TBDP: 10.0000 mg | ORAL_TABLET | ORAL | 11 refills | Status: DC | PRN

## 2018-06-13 MED FILL — ?RIZATRIPTAN 10 MG TABLET: 10 | 30 days supply | Qty: 9 | Fill #0

## 2018-06-13 MED FILL — TOPIRAMATE 50 MG TABLET: 50 | 30 days supply | Qty: 60 | Fill #0

## 2018-06-13 NOTE — Patient Instructions (Signed)
MIGRAINE WITH AURA  - start topiramate 50mg  at bedtime; after 1 week increase to twice a day; drink plenty of water  - rizatriptan 10mg  as needed for breakthrough headache; may repeat x 1 after 2 hours; max 2 tabs per day or 8 per month  - To prevent or relieve headaches, try the following:  Cool Compress. Lie down and place a cool compress on your head.   Avoid headache triggers. If certain foods or odors seem to have triggered your migraines in the past, avoid them. A headache diary might help you identify triggers.   Include physical activity in your daily routine.   Manage stress. Find healthy ways to cope with the stressors, such as delegating tasks on your to-do list.   Practice relaxation techniques. Try deep breathing, yoga, massage and visualization.   Eat regularly. Eating regularly scheduled meals and maintaining a healthy diet might help prevent headaches. Also, drink plenty of fluids.   Follow a regular sleep schedule. Sleep deprivation might contribute to headaches  Consider biofeedback. With this mind-body technique, you learn to control certain bodily functions - such as muscle tension, heart rate and blood pressure - to prevent headaches or reduce headache pain.

## 2018-06-13 NOTE — Progress Notes (Signed)
[facility]  PATIENT: April Hudson DOB: 1970/11/26  REFERRING CLINICIAN: Lacey Jensen, NP HISTORY FROM: patient and husband  REASON FOR VISIT: new consult    HISTORICAL  CHIEF COMPLAINT:  Chief Complaint  Patient presents with  . New Patient (Initial Visit)    Rm 6, a  . Persistent Migraine    Geryl Rankins, NP    HISTORY OF PRESENT ILLNESS:   47 year old female here for evaluation of headaches.  Patient reports headaches since age 80 years old with throbbing pounding sensation, nausea, dizziness, photophobia and phonophobia.  Patient was prescribed Imitrex at that time which seemed to help.  Then headache spontaneously resolved.  Headaches have returned approximately 3 months ago, with similar features in quality.  She describes global throbbing painful headaches with nausea, vomiting, dizziness, seeing spots, losing vision, lasting hours at a time.  Headaches occur 3 times per week.  Patient has been under more stress lately.  No other new symptoms.  Patient was prescribed Imitrex recently to see if this would help, but this is not relieve her headaches.  REVIEW OF SYSTEMS: Full 14 system review of systems performed and negative with exception of: Headache numbness feeling hot blood in stool constipation cough blurred vision loss of vision.   ALLERGIES: Allergies  Allergen Reactions  . Sulfa Antibiotics Hives and Rash    HOME MEDICATIONS: Outpatient Medications Prior to Visit  Medication Sig Dispense Refill  . naproxen (NAPROSYN) 500 MG tablet Take 1 tablet (500 mg total) by mouth 2 (two) times daily with a meal. 60 tablet 0  . sennosides-docusate sodium (SENOKOT-S) 8.6-50 MG tablet Take 1 tablet by mouth daily. 30 tablet 1  . SUMAtriptan (IMITREX) 50 MG tablet Take 1 (one) tablet by mouth at the start of headache. Repeat in 2 hours if headache persists. Do not take more than 2 tablets in 24 hours. 20 tablet 0   No facility-administered  medications prior to visit.     PAST MEDICAL HISTORY: Past Medical History:  Diagnosis Date  . Carpal tunnel syndrome of right wrist   . Ganglion cyst of wrist, right   . History of adenomatous polyp of colon    tubular adenoma's 09/ 2016;  06/ 2017  . History of Clostridium difficile colitis 05/21/2016  . History of frequent urinary tract infections   . History of gastric ulcer    clinical diagnosis, no prior EGD  . History of uterine fibroid   . Migraines   . OA (osteoarthritis)     PAST SURGICAL HISTORY: Past Surgical History:  Procedure Laterality Date  . CARPAL TUNNEL RELEASE Right 01/13/2017   Procedure: CARPAL TUNNEL RELEASE;  Surgeon: Iran Planas, MD;  Location: [facility];  Service: Orthopedics;  Laterality: Right;  . COLONOSCOPY  last one 01/ 03/ 2018   sigmoid scope  . GANGLION CYST EXCISION Right 01/13/2017   Procedure: REMOVAL GANGLION OF WRIST;  Surgeon: Iran Planas, MD;  Location: [facility];  Service: Orthopedics;  Laterality: Right;  . KNEE ARTHROSCOPY Left ~ 2015  . VAGINAL HYSTERECTOMY  2006    FAMILY HISTORY: Family History  Problem Relation Age of Onset  . Diabetes Mother   . Hypertension Mother   . Ovarian cancer Mother   . Stomach cancer Maternal Uncle   . Heart disease Sister   . Healthy Brother   . Asthma Son   . Asthma Daughter   . Colon cancer Maternal Uncle 22  . Colon polyps Neg Hx   .  Esophageal cancer Neg Hx   . Rectal cancer Neg Hx     SOCIAL HISTORY: Social History   Socioeconomic History  . Marital status: Significant Other    Spouse name: Not on file  . Number of children: 2  . Years of education: Not on file  . Highest education level: Not on file  Occupational History  . Not on file  Social Needs  . Financial resource strain: Not on file  . Food insecurity:    Worry: Not on file    Inability: Not on file  . Transportation needs:    Medical: Not on file    Non-medical: Not on file    Tobacco Use  . Smoking status: Current Every Day Smoker    Packs/day: 1.00    Years: 29.00    Pack years: 29.00    Types: Cigarettes  . Smokeless tobacco: Never Used  Substance and Sexual Activity  . Alcohol use: No    Alcohol/week: 0.0 standard drinks  . Drug use: No  . Sexual activity: Yes    Birth control/protection: Surgical  Lifestyle  . Physical activity:    Days per week: Not on file    Minutes per session: Not on file  . Stress: Not on file  Relationships  . Social connections:    Talks on phone: Not on file    Gets together: Not on file    Attends religious service: Not on file    Active member of club or organization: Not on file    Attends meetings of clubs or organizations: Not on file    Relationship status: Not on file  . Intimate partner violence:    Fear of current or ex partner: Not on file    Emotionally abused: Not on file    Physically abused: Not on file    Forced sexual activity: Not on file  Other Topics Concern  . Not on file  Social History Narrative   Lives with husband in a one story home.     Has 6 children (2 biological).     Works at [employer]..  Education: GED     PHYSICAL EXAM  GENERAL EXAM/CONSTITUTIONAL: Vitals:  Vitals:   06/13/18 1457  BP: 106/68  Pulse: 76  SpO2: 97%  Weight: 147 lb 6.4 oz (66.9 kg)  Height: 5\' 4"  (1.626 m)     Body mass index is 25.3 kg/m. Wt Readings from Last 3 Encounters:  06/13/18 147 lb 6.4 oz (66.9 kg)  06/07/18 149 lb (67.6 kg)  05/19/18 153 lb (69.4 kg)     Patient is in no distress; well developed, nourished and groomed; neck is supple  CARDIOVASCULAR:  Examination of carotid arteries is normal; no carotid bruits  Regular rate and rhythm, no murmurs  Examination of peripheral vascular system by observation and palpation is normal  EYES:  Ophthalmoscopic exam of optic discs and posterior segments is normal; no papilledema or hemorrhages  No exam data  present  MUSCULOSKELETAL:  Gait, strength, tone, movements noted in Neurologic exam below  NEUROLOGIC: MENTAL STATUS:  No flowsheet data found.  awake, alert, oriented to person, place and time  recent and remote memory intact  normal attention and concentration  language fluent, comprehension intact, naming intact  fund of knowledge appropriate  CRANIAL NERVE:   2nd - no papilledema on fundoscopic exam  2nd, 3rd, 4th, 6th - pupils equal and reactive to light, visual fields full to confrontation, extraocular muscles intact, no nystagmus  5th - facial sensation symmetric  7th - facial strength symmetric  8th - hearing intact  9th - palate elevates symmetrically, uvula midline  11th - shoulder shrug symmetric  12th - tongue protrusion midline  MOTOR:   normal bulk and tone, full strength in the BUE, BLE  SENSORY:   normal and symmetric to light touch, pinprick, temperature, vibration  COORDINATION:   finger-nose-finger, fine finger movements normal  REFLEXES:   deep tendon reflexes present and symmetric  GAIT/STATION:   narrow based gait; able to walk on toes, heels and tandem; romberg is negative     DIAGNOSTIC DATA (LABS, IMAGING, TESTING) - I reviewed patient records, labs, notes, testing and imaging myself where available.  Lab Results  Component Value Date   WBC 8.6 05/11/2018   HGB 14.8 05/11/2018   HCT 43.5 05/11/2018   MCV 94.6 05/11/2018   PLT 179 05/11/2018      Component Value Date/Time   NA 143 05/11/2018 1214   K 3.5 05/11/2018 1214   CL 109 05/11/2018 1214   CO2 26 05/11/2018 1214   GLUCOSE 97 05/11/2018 1214   BUN 6 05/11/2018 1214   CREATININE 0.73 05/11/2018 1214   CREATININE 0.79 05/24/2016 1657   CALCIUM 9.7 05/11/2018 1214   PROT 7.2 05/11/2018 1214   ALBUMIN 3.9 05/11/2018 1214   AST 22 05/11/2018 1214   ALT 15 05/11/2018 1214   ALKPHOS 70 05/11/2018 1214   BILITOT 0.7 05/11/2018 1214   GFRNONAA >60 05/11/2018  1214   GFRNONAA >89 05/24/2016 1657   GFRAA >60 05/11/2018 1214   GFRAA >89 05/24/2016 1657   No results found for: CHOL, HDL, LDLCALC, LDLDIRECT, TRIG, CHOLHDL Lab Results  Component Value Date   HGBA1C 5.70 10/23/2015   No results found for: VITAMINB12 No results found for: TSH   05/11/18 CT head [I reviewed images myself and agree with interpretation. -VRP]  1. Normal noncontrast head CT   ASSESSMENT AND PLAN  47 y.o. year old female here with history of migraine headaches with aura since age 62 years old, has been headache free for many years, but recently headaches have returned in the setting of increased stress.  Most likely represents recurrence of migraine headaches.  We will proceed with further treatment.  Dx:  1. Migraine with aura and without status migrainosus, not intractable     PLAN:  MIGRAINE WITH AURA  - start topiramate 50mg  at bedtime; after 1 week increase to twice a day; drink plenty of water  - rizatriptan 10mg  as needed for breakthrough headache; may repeat x 1 after 2 hours; max 2 tabs per day or 8 per month  - To prevent or relieve headaches, try the following:  Cool Compress. Lie down and place a cool compress on your head.   Avoid headache triggers. If certain foods or odors seem to have triggered your migraines in the past, avoid them. A headache diary might help you identify triggers.   Include physical activity in your daily routine.   Manage stress. Find healthy ways to cope with the stressors, such as delegating tasks on your to-do list.   Practice relaxation techniques. Try deep breathing, yoga, massage and visualization.   Eat regularly. Eating regularly scheduled meals and maintaining a healthy diet might help prevent headaches. Also, drink plenty of fluids.   Follow a regular sleep schedule. Sleep deprivation might contribute to headaches  Consider biofeedback. With this mind-body technique, you learn to control certain  bodily functions - such  as muscle tension, heart rate and blood pressure - to prevent headaches or reduce headache pain.  Meds ordered this encounter  Medications  . topiramate (TOPAMAX) 50 MG tablet    Sig: Take 1 tablet (50 mg total) by mouth 2 (two) times daily.    Dispense:  60 tablet    Refill:  12  . rizatriptan (MAXALT-MLT) 10 MG disintegrating tablet    Sig: Take 1 tablet (10 mg total) by mouth as needed for migraine. May repeat in 2 hours if needed    Dispense:  9 tablet    Refill:  11   Return in about 4 months (around 10/13/2018).    Penni Bombard, MD 0/06/7948, 9:71 PM Certified in Neurology, Neurophysiology and Neuroimaging  [facility] [address] [place], [place] [address] [phone]

## 2018-06-21 ENCOUNTER — Encounter: Payer: Self-pay | Admitting: Gastroenterology

## 2018-06-21 ENCOUNTER — Ambulatory Visit (AMBULATORY_SURGERY_CENTER): Payer: Self-pay | Admitting: Gastroenterology

## 2018-06-21 VITALS — BP 111/60 | HR 97 | Temp 97.3°F | Resp 18 | Ht 61.0 in | Wt 149.0 lb

## 2018-06-21 DIAGNOSIS — D120 Benign neoplasm of cecum: Secondary | ICD-10-CM

## 2018-06-21 DIAGNOSIS — K635 Polyp of colon: Secondary | ICD-10-CM

## 2018-06-21 DIAGNOSIS — K5730 Diverticulosis of large intestine without perforation or abscess without bleeding: Secondary | ICD-10-CM

## 2018-06-21 DIAGNOSIS — Z86010 Personal history of colonic polyps: Secondary | ICD-10-CM

## 2018-06-21 DIAGNOSIS — D125 Benign neoplasm of sigmoid colon: Secondary | ICD-10-CM

## 2018-06-21 MED ORDER — SODIUM CHLORIDE 0.9 % IV SOLN: 500.0000 mL | Freq: Once | INTRAVENOUS | Status: DC

## 2018-06-21 NOTE — Patient Instructions (Signed)
YOU HAD AN ENDOSCOPIC PROCEDURE TODAY AT [facility]:   Refer to the procedure report that was given to you for any specific questions about what was found during the examination.  If the procedure report does not answer your questions, please call your gastroenterologist to clarify.  If you requested that your care partner not be given the details of your procedure findings, then the procedure report has been included in a sealed envelope for you to review at your convenience later.  YOU SHOULD EXPECT: Some feelings of bloating in the abdomen. Passage of more gas than usual.  Walking can help get rid of the air that was put into your GI tract during the procedure and reduce the bloating. If you had a lower endoscopy (such as a colonoscopy or flexible sigmoidoscopy) you may notice spotting of blood in your stool or on the toilet paper. If you underwent a bowel prep for your procedure, you may not have a normal bowel movement for a few days.  Please Note:  You might notice some irritation and congestion in your nose or some drainage.  This is from the oxygen used during your procedure.  There is no need for concern and it should clear up in a day or so.  SYMPTOMS TO REPORT IMMEDIATELY:   Following lower endoscopy (colonoscopy or flexible sigmoidoscopy):  Excessive amounts of blood in the stool  Significant tenderness or worsening of abdominal pains  Swelling of the abdomen that is new, acute  Fever of 100F or higher  For urgent or emergent issues, a gastroenterologist can be reached at any hour by calling [phone].   DIET:  We do recommend a small meal at first, but then you may proceed to your regular diet.  Drink plenty of fluids but you should avoid alcoholic beverages for 24 hours.  ACTIVITY:  You should plan to take it easy for the rest of today and you should NOT DRIVE or use heavy machinery until tomorrow (because of the sedation medicines used during the test).     FOLLOW UP: Our staff will call the number listed on your records the next business day following your procedure to check on you and address any questions or concerns that you may have regarding the information given to you following your procedure. If we do not reach you, we will leave a message.  However, if you are feeling well and you are not experiencing any problems, there is no need to return our call.  We will assume that you have returned to your regular daily activities without incident.  If any biopsies were taken you will be contacted by phone or by letter within the next 1-3 weeks.  Please call us at [phone] if you have not heard about the biopsies in 3 weeks.   Await for biopsy results, you may likely need to repeat colonoscopy in 6 months Polyps handout given Diverticulosis handout given  SIGNATURES/CONFIDENTIALITY: You and/or your care partner have signed paperwork which will be entered into your electronic medical record.  These signatures attest to the fact that that the information above on your After Visit Summary has been reviewed and is understood.  Full responsibility of the confidentiality of this discharge information lies with you and/or your care-partner.

## 2018-06-21 NOTE — Progress Notes (Signed)
Pt is ready for discharge waiting on a ride.

## 2018-06-21 NOTE — Op Note (Signed)
[facility] Patient Name: April Hudson Procedure Date: 06/21/2018 8:13 AM MRN: [id number] Endoscopist: Milus Banister , MD Age: 47 Referring MD:  Date of Birth: 1971-08-04 Gender: Female Account #: [id number] Procedure:                Colonoscopy Indications:              High risk colon cancer surveillance: Personal                            history of colonic polyps; 06/2015 colonoscopy                            Dr.Deshia Vanderhoof 4cm rectal TA with HGD (not near margin),                            flex sig 2017 with small residual/recurrent TA at                            the site; Flex sig 10/2016 site looked normal,                            biospies were normal as well. Medicines:                Monitored Anesthesia Care Procedure:                Pre-Anesthesia Assessment:                           - Prior to the procedure, a History and Physical                            was performed, and patient medications and                            allergies were reviewed. The patient's tolerance of                            previous anesthesia was also reviewed. The risks                            and benefits of the procedure and the sedation                            options and risks were discussed with the patient.                            All questions were answered, and informed consent                            was obtained. Prior Anticoagulants: The patient has                            taken no previous anticoagulant or antiplatelet  agents. ASA Grade Assessment: II - A patient with                            mild systemic disease. After reviewing the risks                            and benefits, the patient was deemed in                            satisfactory condition to undergo the procedure.                           After obtaining informed consent, the colonoscope                            was passed under direct vision.  Throughout the                            procedure, the patient's blood pressure, pulse, and                            oxygen saturations were monitored continuously. The                            Colonoscope was introduced through the anus and                            advanced to the the cecum, identified by                            appendiceal orifice and ileocecal valve. The                            colonoscopy was performed without difficulty. The                            patient tolerated the procedure well. The quality                            of the bowel preparation was good. The ileocecal                            valve, appendiceal orifice, and rectum were                            photographed. Scope In: 8:45:43 AM Scope Out: 9:01:54 AM Scope Withdrawal Time: 0 hours 13 minutes 32 seconds  Total Procedure Duration: 0 hours 16 minutes 11 seconds  Findings:                 A 22 mm polyp was found in the cecum. The polyp was                            sessile. The polyp was removed with a piecemeal  technique using a cold snare. Resection and                            retrieval were complete.                           A 3 mm polyp was found in the sigmoid colon. The                            polyp was sessile. The polyp was removed with a                            cold snare. Resection and retrieval were complete.                           Multiple small-mouthed diverticula were found in                            the left colon.                           The exam was otherwise without abnormality on                            direct and retroflexion views. The site of the                            previous rectal polyp resection was easily located                            with evident tattoo and was clear of adenomatous                            appearing mucosa. Complications:            No immediate complications. Estimated blood  loss:                            None. Estimated Blood Loss:     Estimated blood loss: none. Impression:               - One 22 mm polyp in the cecum, removed piecemeal                            using a cold snare. Resected and retrieved.                           - One 3 mm polyp in the sigmoid colon, removed with                            a cold snare. Resected and retrieved.                           - Diverticulosis in the left colon.                           -  The examination was otherwise normal on direct                            and retroflexion views. Recommendation:           - Patient has a contact number available for                            emergencies. The signs and symptoms of potential                            delayed complications were discussed with the                            patient. Return to normal activities tomorrow.                            Written discharge instructions were provided to the                            patient.                           - Resume previous diet.                           - Continue present medications.                           - Await final path results, you will likely need                            repeat colonoscopy in 6 months. You will receive a                            letter within 2-3 weeks with the pathology results                            and my final recommendations. Milus Banister, MD 06/21/2018 9:07:46 AM This report has been signed electronically.

## 2018-06-21 NOTE — Progress Notes (Signed)
Pt's states no medical or surgical changes since previsit or office visit. 

## 2018-06-21 NOTE — Progress Notes (Signed)
Called to room to assist during endoscopic procedure.  Patient ID and intended procedure confirmed with present staff. Received instructions for my participation in the procedure from the performing physician.  

## 2018-06-21 NOTE — Progress Notes (Signed)
Report given to PACU, vss 

## 2018-06-22 ENCOUNTER — Telehealth: Payer: Self-pay

## 2018-06-22 NOTE — Telephone Encounter (Signed)
  Follow up Call-  Call back number 06/21/2018 10/06/2016 03/09/2016  Post procedure Call Back phone  # [phone]  Permission to leave phone message Yes Yes Yes  Some recent data might be hidden     Patient questions:  Do you have a fever, pain , or abdominal swelling? No. Pain Score  0 *  Have you tolerated food without any problems? Yes.    Have you been able to return to your normal activities? Yes.    Do you have any questions about your discharge instructions: Diet   No. Medications  No. Follow up visit  No.  Do you have questions or concerns about your Care? No.  Actions: * If pain score is 4 or above: No action needed, pain <4.  No problems noted per pt. maw

## 2018-06-26 ENCOUNTER — Encounter: Payer: Self-pay | Admitting: Gastroenterology

## 2018-06-30 ENCOUNTER — Ambulatory Visit: Payer: Self-pay | Admitting: Nurse Practitioner

## 2018-07-03 ENCOUNTER — Ambulatory Visit: Payer: Self-pay | Admitting: Family Medicine

## 2018-07-14 ENCOUNTER — Ambulatory Visit (HOSPITAL_COMMUNITY)
Admission: EM | Admit: 2018-07-14 | Discharge: 2018-07-14 | Disposition: A | Discharge status: Home / Self Care | Payer: Self-pay | Attending: Family Medicine | Admitting: Family Medicine

## 2018-07-14 ENCOUNTER — Encounter (HOSPITAL_COMMUNITY): Payer: Self-pay | Admitting: Emergency Medicine

## 2018-07-14 ENCOUNTER — Ambulatory Visit (INDEPENDENT_AMBULATORY_CARE_PROVIDER_SITE_OTHER): Payer: Self-pay

## 2018-07-14 DIAGNOSIS — M79672 Pain in left foot: Secondary | ICD-10-CM

## 2018-07-14 MED ORDER — MELOXICAM 7.5 MG PO TABS: 7.5000 mg | ORAL_TABLET | Freq: Every day | ORAL | 0 refills | Status: DC

## 2018-07-14 NOTE — ED Triage Notes (Signed)
Pt c/o left foot inj onset last night... Reports she stepped off the bus and twisted her ankle/foot  Sts she heard a pop... Also sts she did not go to work today d/t pain  A&O x4... NAD... Ambulatory

## 2018-07-14 NOTE — ED Provider Notes (Signed)
[place]    CSN: [id number] Arrival date & time: 07/14/18  1514     History   Chief Complaint Chief Complaint  Patient presents with  . Foot Pain    [name] is a 47 y.o. female.   47 year old female comes in for left foot pain after injury last night.  States stepped off the bus, inverted ankle and heard a pop.  She has had painful weightbearing since then.  States mild pain at rest.  Pain mostly of the lateral region.  No obvious swelling.  Has not taken anything for the symptoms.     Past Medical History:  Diagnosis Date  . Carpal tunnel syndrome of right wrist   . Ganglion cyst of wrist, right   . History of adenomatous polyp of colon    tubular adenoma's 09/ 2016;  06/ 2017  . History of Clostridium difficile colitis 05/21/2016  . History of frequent urinary tract infections   . History of gastric ulcer    clinical diagnosis, no prior EGD  . History of uterine fibroid   . Migraines   . OA (osteoarthritis)     Patient Active Problem List   Diagnosis Date Noted  . Influenza A 10/23/2017  . Ganglion cyst of wrist, right 09/16/2016  . Esophageal reflux 05/24/2016  . Anal fissure 10/23/2015  . Epidermoid cyst 09/02/2015  . 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 Tinea pedis 05/26/2015  . Pain of left thumb 05/26/2015  . 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 De Quervain's tenosynovitis, left 04/14/2015  . Neck pain on right side 01/13/2015  . Carpal tunnel syndrome 12/30/2014  . Smoker 07/03/2014  . Family history of stomach cancer 07/03/2014    Past Surgical History:  Procedure Laterality Date  . CARPAL TUNNEL RELEASE Right 01/13/2017   Procedure: CARPAL TUNNEL RELEASE;  Surgeon: Iran Planas, MD;  Location: [facility];  Service: Orthopedics;  Laterality: Right;  . COLONOSCOPY  last one 01/ 03/ 2018   sigmoid scope  . GANGLION CYST EXCISION Right 01/13/2017   Procedure: REMOVAL GANGLION OF  WRIST;  Surgeon: Iran Planas, MD;  Location: [facility];  Service: Orthopedics;  Laterality: Right;  . KNEE ARTHROSCOPY Left ~ 2015  . VAGINAL HYSTERECTOMY  2006    OB History   None      Home Medications    Prior to Admission medications   Medication Sig Start Date End Date Taking? Authorizing Provider  rizatriptan (MAXALT-MLT) 10 MG disintegrating tablet Take 1 tablet (10 mg total) by mouth as needed for migraine. May repeat in 2 hours if needed 06/13/18  Yes Penumalli, Earlean Polka, MD  SUMAtriptan (IMITREX) 50 MG tablet Take 1 (one) tablet by mouth at the start of headache. Repeat in 2 hours if headache persists. Do not take more than 2 tablets in 24 hours. 05/19/18  Yes [name], NP  topiramate (TOPAMAX) 50 MG tablet Take 1 tablet (50 mg total) by mouth 2 (two) times daily. 06/13/18  Yes Penumalli, Earlean Polka, MD  meloxicam (MOBIC) 7.5 MG tablet Take 1 tablet (7.5 mg total) by mouth daily. 07/14/18   Tynisa Vohs V, PA-C  sennosides-docusate sodium (SENOKOT-S) 8.6-50 MG tablet Take 1 tablet by mouth daily. 03/17/18   [name], NP    Family History Family History  Problem Relation Age of Onset  . Diabetes Mother   . Hypertension Mother   . Ovarian cancer Mother   . Stomach  cancer Maternal Uncle   . Heart disease Sister   . Healthy Brother   . Asthma Son   . Asthma Daughter   . Colon cancer Maternal Uncle 47  . Colon polyps Neg Hx   . Esophageal cancer Neg Hx   . Rectal cancer Neg Hx     Social History Social History   Tobacco Use  . Smoking status: Current Every Day Smoker    Packs/day: 1.00    Years: 29.00    Pack years: 29.00    Types: Cigarettes  . Smokeless tobacco: Never Used  Substance Use Topics  . Alcohol use: No    Alcohol/week: 0.0 standard drinks  . Drug use: No     Allergies   Sulfa antibiotics   Review of Systems Review of Systems  Reason unable to perform ROS: See HPI as above.     Physical Exam Triage Vital  Signs ED Triage Vitals  Enc Vitals Group     BP 07/14/18 1626 111/74     Pulse Rate 07/14/18 1626 89     Resp 07/14/18 1626 16     Temp 07/14/18 1626 97.8 F (36.6 C)     Temp Source 07/14/18 1626 Oral     SpO2 07/14/18 1626 99 %     Weight --      Height --      Head Circumference --      Peak Flow --      Pain Score 07/14/18 1627 9     Pain Loc --      Pain Edu? --      Excl. in [place]? --    No data found.  Updated Vital Signs BP 111/74 (BP Location: Right Arm)   Pulse 89   Temp 97.8 F (36.6 C) (Oral)   Resp 16   SpO2 99%   Physical Exam  Constitutional: She is oriented to person, place, and time. She appears well-developed and well-nourished. No distress.  HENT:  Head: Normocephalic and atraumatic.  Eyes: Pupils are equal, round, and reactive to light. Conjunctivae are normal.  Musculoskeletal:  No swelling, erythema, warmth, contusion seen.  Tenderness to palpation of proximal fourth and fifth MTP.  Full range of motion of the ankle, though with increased pain upon extension.  Strength decreased due to pain.  Sensation intact and equal bilaterally.  Pedal pulse 2+ and equal bilaterally.  Neurological: She is alert and oriented to person, place, and time.  Skin: She is not diaphoretic.     UC Treatments / Results  Labs (all labs ordered are listed, but only abnormal results are displayed) Labs Reviewed - No data to display  EKG None  Radiology Dg Foot Complete Left  Result Date: 07/14/2018 CLINICAL DATA:  Pain in LEFT foot, after stepping off of a bus. EXAM: LEFT FOOT - COMPLETE 3+ VIEW COMPARISON:  None. FINDINGS: There is no evidence of fracture or dislocation. There is no evidence of arthropathy or other focal bone abnormality. Mild soft tissue swelling. IMPRESSION: Negative. Electronically Signed   By: Staci Righter M.D.   On: 07/14/2018 17:18    Procedures Procedures (including critical care time)  Medications Ordered in UC Medications - No data to  display  Initial Impression / Assessment and Plan / UC Course  I have reviewed the triage vital signs and the nursing notes.  Pertinent labs & imaging results that were available during my care of the patient were reviewed by me and considered in my medical decision  making (see chart for details).    X-ray negative for fracture or dislocation.  NSAIDs, ice compress, ankle brace during activity.  Crutches as needed.  Return precautions given.  Patient expresses understanding and agrees to plan.  Final Clinical Impressions(s) / UC Diagnoses   Final diagnoses:  Foot pain, left    ED Prescriptions    Medication Sig Dispense Auth. Provider   meloxicam (MOBIC) 7.5 MG tablet Take 1 tablet (7.5 mg total) by mouth daily. 15 tablet Tobin Chad, [place] 07/14/18 [phone]

## 2018-07-14 NOTE — Discharge Instructions (Signed)
X-ray negative for fracture or dislocation.  Start Mobic. Do not take ibuprofen (motrin/advil)/ naproxen (aleve) while on mobic.  You can take Tylenol as needed for breakthrough pain.  Ice compress, elevation, ankle brace during activity.  You can use crutches for the next few days to help with pain relief.  This may take a few weeks to completely resolve, but should be feeling better each week.  Follow-up with PCP for further evaluation if symptoms not improving.

## 2018-07-17 MED FILL — MELOXICAM 7.5 MG TABLET: 7.5 | 15 days supply | Qty: 15 | Fill #0

## 2018-08-01 ENCOUNTER — Ambulatory Visit: Payer: Self-pay | Attending: Nurse Practitioner | Admitting: Physician Assistant

## 2018-08-01 VITALS — BP 97/65 | HR 85 | Temp 98.3°F | Resp 18 | Ht 61.0 in | Wt 150.0 lb

## 2018-08-01 DIAGNOSIS — F17200 Nicotine dependence, unspecified, uncomplicated: Secondary | ICD-10-CM | POA: Insufficient documentation

## 2018-08-01 DIAGNOSIS — M79672 Pain in left foot: Secondary | ICD-10-CM | POA: Insufficient documentation

## 2018-08-01 DIAGNOSIS — Z79899 Other long term (current) drug therapy: Secondary | ICD-10-CM | POA: Insufficient documentation

## 2018-08-01 DIAGNOSIS — G43909 Migraine, unspecified, not intractable, without status migrainosus: Secondary | ICD-10-CM | POA: Insufficient documentation

## 2018-08-01 NOTE — Patient Instructions (Signed)
If no relief by the end of the week, please call us back

## 2018-08-01 NOTE — Progress Notes (Signed)
Chief Complaint: Left foot pain, urgent care f/u  Subjective: This is a 47 year old female with a history of migraine headaches and smoking who is here for urgent care follow-up.  On 07/14/2018 she stepped off the bus incorrectly landing injury to her left lateral aspect of the foot.  She did go and seek treatment.  An x-ray was done which was negative for any fractures.  She felt like she heard a pop.  She has been in pain every since.  She was prescribed anti-inflammatories which only temporary relieves the discomfort.  Unfortunately she works at [employer] and is on her feet almost all day every day.   ROS:  GEN: denies fever or chills, denies change in weight Skin: denies lesions or rashes, no break in skin EXT: denies muscle spasms, minimal swelling; + pain in lower ext, no weakness NEURO: denies numbness or tingling, denies sz, stroke or TIA   Objective:  Vitals:   08/01/18 0857  BP: 97/65  Pulse: 85  Resp: 18  Temp: 98.3 F (36.8 C)  TempSrc: Oral  SpO2: 96%  Weight: 150 lb (68 kg)  Height: 5\' 1"  (1.549 m)    Physical Exam:  General: in no acute distress. Extremities: No clubbing or cyanosis, minimal edema of left lateral aspect of foot, not ankle. Ankle without injury. Positive pedal pulses. Neuro: Alert, awake, oriented x3, nonfocal. Reflexes intact.   Medications: Prior to Admission medications   Medication Sig Start Date End Date Taking? Authorizing Provider  meloxicam (MOBIC) 7.5 MG tablet Take 1 tablet (7.5 mg total) by mouth daily. 07/14/18  Yes Yu, Amy V, PA-C  rizatriptan (MAXALT-MLT) 10 MG disintegrating tablet Take 1 tablet (10 mg total) by mouth as needed for migraine. May repeat in 2 hours if needed 06/13/18  Yes Penumalli, Earlean Polka, MD  sennosides-docusate sodium (SENOKOT-S) 8.6-50 MG tablet Take 1 tablet by mouth daily. 03/17/18  Yes [name], NP  SUMAtriptan (IMITREX) 50 MG tablet Take 1 (one) tablet by mouth at the start of headache. Repeat in  2 hours if headache persists. Do not take more than 2 tablets in 24 hours. 05/19/18  Yes [name], NP  topiramate (TOPAMAX) 50 MG tablet Take 1 tablet (50 mg total) by mouth 2 (two) times daily. 06/13/18  Yes Penumalli, Earlean Polka, MD    Impression: Left Foot Pain after sustaining injury.  She initially was seen by urgent care on 07/14/2018.  X-rays were favorable at that time of the left foot.  She was prescribed anti-inflammatories.  I encouraged her to continue taking the anti-inflammatories.  She should do nonweightbearing for a few days but works at [employer] which makes it very difficult.  I did not repeat imaging today but if she has not had relief by the end of the week, consider MRI.  We Ace wrapped her foot prior to leaving.  We did not have any nonweightbearing boots.   Follow up:as scheduled with [facility]  The patient was given clear instructions to go to ER or return to medical center if symptoms don't improve, worsen or new problems develop. The patient verbalized understanding. The patient was told to call to get lab results if they haven't heard anything in the next week.   This note has been created with Surveyor, quantity. Any transcriptional errors are unintentional.   Zettie Pho, PA-C 08/01/2018, 9:23 AM

## 2018-09-13 ENCOUNTER — Encounter (HOSPITAL_COMMUNITY): Payer: Self-pay

## 2018-09-13 ENCOUNTER — Ambulatory Visit (INDEPENDENT_AMBULATORY_CARE_PROVIDER_SITE_OTHER): Payer: Self-pay

## 2018-09-13 ENCOUNTER — Ambulatory Visit (HOSPITAL_COMMUNITY)
Admission: EM | Admit: 2018-09-13 | Discharge: 2018-09-13 | Disposition: A | Discharge status: Home / Self Care | Payer: Self-pay | Attending: Family Medicine | Admitting: Family Medicine

## 2018-09-13 DIAGNOSIS — M79672 Pain in left foot: Secondary | ICD-10-CM | POA: Insufficient documentation

## 2018-09-13 MED ORDER — NAPROXEN 500 MG PO TABS: 500.0000 mg | ORAL_TABLET | Freq: Two times a day (BID) | ORAL | 0 refills | Status: DC

## 2018-09-13 MED ORDER — IBUPROFEN 800 MG PO TABS
800.0000 mg | ORAL_TABLET | Freq: Once | ORAL | Status: AC
  Administered 2018-09-13: 800 mg via ORAL

## 2018-09-13 MED ORDER — IBUPROFEN 800 MG PO TABS
ORAL_TABLET | ORAL | Status: AC
  Filled 2018-09-13: qty 1

## 2018-09-13 NOTE — ED Provider Notes (Signed)
[place]   [id number] 09/13/18 Arrival Time: 7829  ASSESSMENT & PLAN:  1. Foot pain, left   Question overuse/mechanical etiology. Discussed.  I have personally viewed the imaging studies ordered this visit. No fracture or bony abnormality identified.  Imaging: Dg Foot Complete Left  Result Date: 09/13/2018 CLINICAL DATA:  Left foot injury with persistent pain EXAM: LEFT FOOT - COMPLETE 3+ VIEW COMPARISON:  07/14/2018 FINDINGS: No fracture or malalignment. No periostitis or new bone formation to suggest healing fracture. Mild degenerative change at the first MTP joint. IMPRESSION: Negative. Electronically Signed   By: Donavan Foil M.D.   On: 09/13/2018 18:18   Meds ordered this encounter  Medications  . ibuprofen (ADVIL,MOTRIN) tablet 800 mg (given today)  . naproxen (NAPROSYN) 500 MG tablet    Sig: Take 1 tablet (500 mg total) by mouth 2 (two) times daily with a meal.    Dispense:  20 tablet    Refill:  0   Orders Placed This Encounter  Procedures  . DG Foot Complete Left  . Apply cam walker  To wear for the next 1-2 weeks. Will f/u with PCP or orthopaedist if not improving.  Follow-up Information    [name], NP.   Specialty:  Nurse Practitioner Why:  If not improving over the next 2-3 weeks. Contact information: [place] [address] [phone]          Work note given stating that she must wear cam walker.  Reviewed expectations re: course of current medical issues. Questions answered. Outlined signs and symptoms indicating need for more acute intervention. Patient verbalized understanding. After Visit Summary given.  SUBJECTIVE: History from: patient. April Hudson is a 47 y.o. female who reports fairly persistent mild to moderate pain of her left lateral foot; described as aching without radiation. Onset: gradual, about 2 months ago; seen here on 07/14/2018; note reviewed. New injury/trama: no. Works at [employer] and on her feet all day. Aggravating factors: prolonged walking/standing. Alleviating factors: rest. Associated symptoms: none reported. Extremity sensation changes or weakness: none. Self treatment: occasional Tylenol; questions mild help.  Past Surgical History:  Procedure Laterality Date  . CARPAL TUNNEL RELEASE Right 01/13/2017   Procedure: CARPAL TUNNEL RELEASE;  Surgeon: Iran Planas, MD;  Location: [facility];  Service: Orthopedics;  Laterality: Right;  . COLONOSCOPY  last one 01/ 03/ 2018   sigmoid scope  . GANGLION CYST EXCISION Right 01/13/2017   Procedure: REMOVAL GANGLION OF WRIST;  Surgeon: Iran Planas, MD;  Location: [facility];  Service: Orthopedics;  Laterality: Right;  . KNEE ARTHROSCOPY Left ~ 2015  . VAGINAL HYSTERECTOMY  2006     ROS: As per HPI.   OBJECTIVE:  Vitals:   09/13/18 1729  BP: 103/64  Pulse: 97  Resp: 18  Temp: 98.1 F (36.7 C)  TempSrc: Oral  SpO2: 100%    General appearance: alert; no distress Extremities: . LLE: warm and well perfused; poorly localized moderate tenderness over left dorsal/lateral foot; without gross deformities; with no swelling; with no bruising; ROM: normal at ankle; no redness/warmth CV: brisk extremity capillary refill of LLE; 2+ DP and PT pulse of LLE. Skin: warm and dry; no visible rashes Neurologic: gait normal; normal reflexes of RLE and LLE; normal sensation of RLE and LLE; normal strength of RLE and LLE Psychological: alert and cooperative; normal mood and affect  Allergies  Allergen Reactions  . Sulfa Antibiotics Hives and Rash  Past Medical History:  Diagnosis Date  . Carpal tunnel syndrome of right wrist   . Ganglion cyst of wrist, right   . History of adenomatous polyp of colon    tubular adenoma's 09/ 2016;  06/ 2017  . History of Clostridium difficile colitis 05/21/2016  . History of frequent urinary tract infections   . History of gastric ulcer    clinical  diagnosis, no prior EGD  . History of uterine fibroid   . Migraines   . OA (osteoarthritis)    Social History   Socioeconomic History  . Marital status: Significant Other    Spouse name: Not on file  . Number of children: 2  . Years of education: Not on file  . Highest education level: Not on file  Occupational History  . Not on file  Social Needs  . Financial resource strain: Not on file  . Food insecurity:    Worry: Not on file    Inability: Not on file  . Transportation needs:    Medical: Not on file    Non-medical: Not on file  Tobacco Use  . Smoking status: Current Every Day Smoker    Packs/day: 1.00    Years: 29.00    Pack years: 29.00    Types: Cigarettes  . Smokeless tobacco: Never Used  Substance and Sexual Activity  . Alcohol use: No    Alcohol/week: 0.0 standard drinks  . Drug use: No  . Sexual activity: Yes    Birth control/protection: Surgical  Lifestyle  . Physical activity:    Days per week: Not on file    Minutes per session: Not on file  . Stress: Not on file  Relationships  . Social connections:    Talks on phone: Not on file    Gets together: Not on file    Attends religious service: Not on file    Active member of club or organization: Not on file    Attends meetings of clubs or organizations: Not on file    Relationship status: Not on file  Other Topics Concern  . Not on file  Social History Narrative   Lives with husband in a one story home.     Has 6 children (2 biological).     Works at [employer]..  Education: GED   Family History  Problem Relation Age of Onset  . Diabetes Mother   . Hypertension Mother   . Ovarian cancer Mother   . Stomach cancer Maternal Uncle   . Heart disease Sister   . Healthy Brother   . Asthma Son   . Asthma Daughter   . Colon cancer Maternal Uncle 44  . Colon polyps Neg Hx   . Esophageal cancer Neg Hx   . Rectal cancer Neg Hx    Past Surgical History:  Procedure Laterality Date  . CARPAL TUNNEL  RELEASE Right 01/13/2017   Procedure: CARPAL TUNNEL RELEASE;  Surgeon: Iran Planas, MD;  Location: [facility];  Service: Orthopedics;  Laterality: Right;  . COLONOSCOPY  last one 01/ 03/ 2018   sigmoid scope  . GANGLION CYST EXCISION Right 01/13/2017   Procedure: REMOVAL GANGLION OF WRIST;  Surgeon: Iran Planas, MD;  Location: [facility];  Service: Orthopedics;  Laterality: Right;  . KNEE ARTHROSCOPY Left ~ 2015  . VAGINAL HYSTERECTOMY  2006      Vanessa Kick, MD 09/14/18 1008

## 2018-09-13 NOTE — ED Notes (Signed)
Patient able to ambulate independently with cam walker in place

## 2018-09-13 NOTE — ED Triage Notes (Signed)
Pt presents with ongoing left foot pain.

## 2018-09-14 MED FILL — NAPROXEN 500 MG TABLET: 500 | 10 days supply | Qty: 20 | Fill #0

## 2018-10-03 ENCOUNTER — Ambulatory Visit: Payer: Self-pay | Attending: Nurse Practitioner | Admitting: Nurse Practitioner

## 2018-10-03 ENCOUNTER — Encounter: Payer: Self-pay | Admitting: Nurse Practitioner

## 2018-10-03 VITALS — BP 98/64 | HR 98 | Temp 98.5°F | Ht 61.0 in | Wt 155.6 lb

## 2018-10-03 DIAGNOSIS — Z882 Allergy status to sulfonamides status: Secondary | ICD-10-CM | POA: Insufficient documentation

## 2018-10-03 DIAGNOSIS — Z833 Family history of diabetes mellitus: Secondary | ICD-10-CM | POA: Insufficient documentation

## 2018-10-03 DIAGNOSIS — R05 Cough: Secondary | ICD-10-CM | POA: Insufficient documentation

## 2018-10-03 DIAGNOSIS — H66002 Acute suppurative otitis media without spontaneous rupture of ear drum, left ear: Secondary | ICD-10-CM

## 2018-10-03 DIAGNOSIS — Z792 Long term (current) use of antibiotics: Secondary | ICD-10-CM | POA: Insufficient documentation

## 2018-10-03 DIAGNOSIS — M1990 Unspecified osteoarthritis, unspecified site: Secondary | ICD-10-CM | POA: Insufficient documentation

## 2018-10-03 DIAGNOSIS — R058 Other specified cough: Secondary | ICD-10-CM

## 2018-10-03 DIAGNOSIS — M79672 Pain in left foot: Secondary | ICD-10-CM

## 2018-10-03 DIAGNOSIS — Z79899 Other long term (current) drug therapy: Secondary | ICD-10-CM | POA: Insufficient documentation

## 2018-10-03 MED ORDER — AMOXICILLIN-POT CLAVULANATE 875-125 MG PO TABS: 1.0000 | ORAL_TABLET | Freq: Two times a day (BID) | ORAL | 0 refills | Status: AC

## 2018-10-03 MED ORDER — TRAMADOL HCL 50 MG PO TABS: 50.0000 mg | ORAL_TABLET | Freq: Two times a day (BID) | ORAL | 0 refills | Status: AC

## 2018-10-03 MED ORDER — BENZONATATE 200 MG PO CAPS: 200.0000 mg | ORAL_CAPSULE | Freq: Two times a day (BID) | ORAL | 0 refills | Status: DC | PRN

## 2018-10-03 MED FILL — BENZONATATE 100 MG CAP: 100 | 10 days supply | Qty: 40 | Fill #0

## 2018-10-03 MED FILL — traMADol HCL 50 MG TABS: 50 | 15 days supply | Qty: 30 | Fill #0

## 2018-10-03 MED FILL — AMOX-CLAV 875-125 MG TABLET: 875-125 | 7 days supply | Qty: 14 | Fill #0

## 2018-10-03 NOTE — Progress Notes (Signed)
Assessment & Plan:  [name] was seen today for foot pain and cough.  Diagnoses and all orders for this visit:  Left foot pain -     AMB referral to orthopedics  Non-recurrent acute suppurative otitis media of left ear without spontaneous rupture of tympanic membrane  Cough present for greater than 3 weeks  Other orders -     traMADol (ULTRAM) 50 MG tablet; Take 1 tablet (50 mg total) by mouth 2 (two) times daily. -     amoxicillin-clavulanate (AUGMENTIN) 875-125 MG tablet; Take 1 tablet by mouth 2 (two) times daily for 7 days. -     benzonatate (TESSALON) 200 MG capsule; Take 1 capsule (200 mg total) by mouth 2 (two) times daily as needed for cough.    Patient has been counseled on age-appropriate routine health concerns for screening and prevention. These are reviewed and up-to-date. Referrals have been placed accordingly. Immunizations are up-to-date or declined.    Subjective:   Chief Complaint  Patient presents with  . Foot Pain  . Cough   [name] 47 y.o. female presents to office today for follow up to   ROS  Past Medical History:  Diagnosis Date  . Carpal tunnel syndrome of right wrist   . Ganglion cyst of wrist, right   . History of adenomatous polyp of colon    tubular adenoma's 09/ 2016;  06/ 2017  . History of Clostridium difficile colitis 05/21/2016  . History of frequent urinary tract infections   . History of gastric ulcer    clinical diagnosis, no prior EGD  . History of uterine fibroid   . Migraines   . OA (osteoarthritis)     Past Surgical History:  Procedure Laterality Date  . CARPAL TUNNEL RELEASE Right 01/13/2017   Procedure: CARPAL TUNNEL RELEASE;  Surgeon: Iran Planas, MD;  Location: [facility];  Service: Orthopedics;  Laterality: Right;  . COLONOSCOPY  last one 01/ 03/ 2018   sigmoid scope  . GANGLION CYST EXCISION Right 01/13/2017   Procedure: REMOVAL GANGLION OF WRIST;  Surgeon: Iran Planas, MD;  Location:  [facility];  Service: Orthopedics;  Laterality: Right;  . KNEE ARTHROSCOPY Left ~ 2015  . VAGINAL HYSTERECTOMY  2006    Family History  Problem Relation Age of Onset  . Diabetes Mother   . Hypertension Mother   . Ovarian cancer Mother   . Stomach cancer Maternal Uncle   . Heart disease Sister   . Healthy Brother   . Asthma Son   . Asthma Daughter   . Colon cancer Maternal Uncle 50  . Colon polyps Neg Hx   . Esophageal cancer Neg Hx   . Rectal cancer Neg Hx     Social History Reviewed with no changes to be made today.   Outpatient Medications Prior to Visit  Medication Sig Dispense Refill  . rizatriptan (MAXALT-MLT) 10 MG disintegrating tablet Take 1 tablet (10 mg total) by mouth as needed for migraine. May repeat in 2 hours if needed 9 tablet 11  . SUMAtriptan (IMITREX) 50 MG tablet Take 1 (one) tablet by mouth at the start of headache. Repeat in 2 hours if headache persists. Do not take more than 2 tablets in 24 hours. 20 tablet 0  . topiramate (TOPAMAX) 50 MG tablet Take 1 tablet (50 mg total) by mouth 2 (two) times daily. 60 tablet 12  . naproxen (NAPROSYN) 500 MG tablet Take 1 tablet (500 mg total) by mouth  2 (two) times daily with a meal. (Patient not taking: Reported on 10/03/2018) 20 tablet 0  . sennosides-docusate sodium (SENOKOT-S) 8.6-50 MG tablet Take 1 tablet by mouth daily. (Patient not taking: Reported on 10/03/2018) 30 tablet 1   Facility-Administered Medications Prior to Visit  Medication Dose Route Frequency Provider Last Rate Last Dose  . 0.9 %  sodium chloride infusion  500 mL Intravenous Once Milus Banister, MD        Allergies  Allergen Reactions  . Sulfa Antibiotics Hives and Rash       Objective:    BP 98/64   Pulse 98   Temp 98.5 F (36.9 C) (Oral)   Ht 5\' 1"  (1.549 m)   Wt 155 lb 9.6 oz (70.6 kg)   SpO2 97%   BMI 29.40 kg/m  Wt Readings from Last 3 Encounters:  10/03/18 155 lb 9.6 oz (70.6 kg)  08/01/18 150 lb (68 kg)    06/21/18 149 lb (67.6 kg)    Physical Exam       Patient has been counseled extensively about nutrition and exercise as well as the importance of adherence with medications and regular follow-up. The patient was given clear instructions to go to ER or return to medical center if symptoms don't improve, worsen or new problems develop. The patient verbalized understanding.   Follow-up: No follow-ups on file.   April Pounds, FNP-BC [facility] [place], [place]   10/03/2018, 9:05 AM

## 2018-10-17 ENCOUNTER — Ambulatory Visit: Payer: Self-pay | Admitting: Diagnostic Neuroimaging

## 2018-11-07 ENCOUNTER — Ambulatory Visit: Payer: Self-pay | Admitting: Diagnostic Neuroimaging

## 2018-11-07 ENCOUNTER — Telehealth: Payer: Self-pay | Admitting: *Deleted

## 2018-11-07 NOTE — Telephone Encounter (Signed)
The patient came in for follow up today. Her [facility] % financial aid expired 09/16/18. She left without being seen.

## 2018-11-08 ENCOUNTER — Encounter: Payer: Self-pay | Admitting: Diagnostic Neuroimaging

## 2018-11-10 ENCOUNTER — Ambulatory Visit: Payer: Self-pay

## 2018-11-21 ENCOUNTER — Emergency Department (HOSPITAL_COMMUNITY)
Admission: EM | Admit: 2018-11-21 | Discharge: 2018-11-21 | Disposition: A | Discharge status: Home / Self Care | Payer: Self-pay | Attending: Emergency Medicine | Admitting: Emergency Medicine

## 2018-11-21 ENCOUNTER — Encounter (HOSPITAL_COMMUNITY): Payer: Self-pay | Admitting: Emergency Medicine

## 2018-11-21 DIAGNOSIS — Z5321 Procedure and treatment not carried out due to patient leaving prior to being seen by health care provider: Secondary | ICD-10-CM | POA: Insufficient documentation

## 2018-11-21 DIAGNOSIS — R197 Diarrhea, unspecified: Secondary | ICD-10-CM | POA: Insufficient documentation

## 2018-11-21 LAB — CBC
HCT: 47.8 % — ABNORMAL HIGH (ref 36.0–46.0)
Hemoglobin: 15.6 g/dL — ABNORMAL HIGH (ref 12.0–15.0)
MCH: 31.8 pg (ref 26.0–34.0)
MCHC: 32.6 g/dL (ref 30.0–36.0)
MCV: 97.4 fL (ref 80.0–100.0)
Platelets: 178 10*3/uL (ref 150–400)
RBC: 4.91 MIL/uL (ref 3.87–5.11)
RDW: 13.2 % (ref 11.5–15.5)
WBC: 4.4 10*3/uL (ref 4.0–10.5)
nRBC: 0 % (ref 0.0–0.2)

## 2018-11-21 LAB — COMPREHENSIVE METABOLIC PANEL
ALT: 11 U/L (ref 0–44)
AST: 18 U/L (ref 15–41)
Albumin: 3.8 g/dL (ref 3.5–5.0)
Alkaline Phosphatase: 51 U/L (ref 38–126)
Anion gap: 11 (ref 5–15)
BUN: 5 mg/dL — ABNORMAL LOW (ref 6–20)
CO2: 22 mmol/L (ref 22–32)
Calcium: 9.4 mg/dL (ref 8.9–10.3)
Chloride: 104 mmol/L (ref 98–111)
Creatinine, Ser: 0.82 mg/dL (ref 0.44–1.00)
GFR calc Af Amer: >60 mL/min (ref >60)
GFR calc non Af Amer: >60 mL/min (ref >60)
GLUCOSE: 86 mg/dL (ref 70–99)
Potassium: 4.4 mmol/L (ref 3.5–5.1)
Sodium: 137 mmol/L (ref 135–145)
Total Bilirubin: 0.6 mg/dL (ref 0.3–1.2)
Total Protein: 7 g/dL (ref 6.5–8.1)

## 2018-11-21 LAB — LIPASE, BLOOD: Lipase: 32 U/L (ref 11–51)

## 2018-11-21 LAB — I-STAT BETA HCG BLOOD, ED (MC, WL, AP ONLY): I-stat hCG, quantitative: <5 m[IU]/mL (ref <5)

## 2018-11-21 MED ORDER — ONDANSETRON 4 MG PO TBDP: 4.0000 mg | ORAL_TABLET | Freq: Once | ORAL | Status: DC | PRN

## 2018-11-21 MED ORDER — SODIUM CHLORIDE 0.9% FLUSH: 3.0000 mL | Freq: Once | INTRAVENOUS | Status: DC

## 2018-11-21 NOTE — ED Notes (Signed)
Patient did not answer when called for vitals

## 2018-11-21 NOTE — ED Notes (Signed)
No reply for room x3.

## 2018-11-21 NOTE — ED Triage Notes (Signed)
Pt reports h/a, abdominal pain and diarrhea sine yesterday at work.

## 2018-12-21 ENCOUNTER — Encounter: Payer: Self-pay | Admitting: Gastroenterology

## 2018-12-23 ENCOUNTER — Emergency Department (HOSPITAL_COMMUNITY): Payer: Self-pay

## 2018-12-23 ENCOUNTER — Emergency Department (HOSPITAL_COMMUNITY)
Admission: EM | Admit: 2018-12-23 | Discharge: 2018-12-23 | Disposition: A | Discharge status: Home / Self Care | Payer: Self-pay | Attending: Emergency Medicine | Admitting: Emergency Medicine

## 2018-12-23 ENCOUNTER — Other Ambulatory Visit: Payer: Self-pay

## 2018-12-23 ENCOUNTER — Encounter (HOSPITAL_COMMUNITY): Payer: Self-pay | Admitting: Student

## 2018-12-23 DIAGNOSIS — R0981 Nasal congestion: Secondary | ICD-10-CM | POA: Insufficient documentation

## 2018-12-23 DIAGNOSIS — Z79899 Other long term (current) drug therapy: Secondary | ICD-10-CM | POA: Insufficient documentation

## 2018-12-23 DIAGNOSIS — R51 Headache: Secondary | ICD-10-CM | POA: Insufficient documentation

## 2018-12-23 DIAGNOSIS — F17210 Nicotine dependence, cigarettes, uncomplicated: Secondary | ICD-10-CM | POA: Insufficient documentation

## 2018-12-23 DIAGNOSIS — J111 Influenza due to unidentified influenza virus with other respiratory manifestations: Secondary | ICD-10-CM | POA: Insufficient documentation

## 2018-12-23 DIAGNOSIS — R1110 Vomiting, unspecified: Secondary | ICD-10-CM | POA: Insufficient documentation

## 2018-12-23 DIAGNOSIS — R05 Cough: Secondary | ICD-10-CM | POA: Insufficient documentation

## 2018-12-23 DIAGNOSIS — R0602 Shortness of breath: Secondary | ICD-10-CM | POA: Insufficient documentation

## 2018-12-23 DIAGNOSIS — R69 Illness, unspecified: Secondary | ICD-10-CM

## 2018-12-23 LAB — BASIC METABOLIC PANEL
Anion gap: 16 — ABNORMAL HIGH (ref 5–15)
Anion gap: 9 (ref 5–15)
BUN: 8 mg/dL (ref 6–20)
BUN: 9 mg/dL (ref 6–20)
CHLORIDE: 99 mmol/L (ref 98–111)
CO2: 22 mmol/L (ref 22–32)
CO2: 26 mmol/L (ref 22–32)
Calcium: 10.4 mg/dL — ABNORMAL HIGH (ref 8.9–10.3)
Calcium: 9.4 mg/dL (ref 8.9–10.3)
Chloride: 103 mmol/L (ref 98–111)
Creatinine, Ser: 0.83 mg/dL (ref 0.44–1.00)
Creatinine, Ser: 0.98 mg/dL (ref 0.44–1.00)
GFR calc Af Amer: >60 mL/min (ref >60)
GFR calc non Af Amer: >60 mL/min (ref >60)
Glucose, Bld: 103 mg/dL — ABNORMAL HIGH (ref 70–99)
Glucose, Bld: 114 mg/dL — ABNORMAL HIGH (ref 70–99)
POTASSIUM: 3.7 mmol/L (ref 3.5–5.1)
Potassium: 3.4 mmol/L — ABNORMAL LOW (ref 3.5–5.1)
Sodium: 137 mmol/L (ref 135–145)
Sodium: 138 mmol/L (ref 135–145)

## 2018-12-23 LAB — I-STAT BETA HCG BLOOD, ED (MC, WL, AP ONLY): I-stat hCG, quantitative: <5 m[IU]/mL (ref <5)

## 2018-12-23 LAB — CBC WITH DIFFERENTIAL/PLATELET
ABS IMMATURE GRANULOCYTES: 0.04 10*3/uL (ref 0.00–0.07)
Basophils Absolute: 0.1 10*3/uL (ref 0.0–0.1)
Basophils Relative: 1 %
Eosinophils Absolute: 0.1 10*3/uL (ref 0.0–0.5)
Eosinophils Relative: 2 %
HCT: 47.6 % — ABNORMAL HIGH (ref 36.0–46.0)
Hemoglobin: 16 g/dL — ABNORMAL HIGH (ref 12.0–15.0)
Immature Granulocytes: 1 %
LYMPHS ABS: 3.4 10*3/uL (ref 0.7–4.0)
Lymphocytes Relative: 44 %
MCH: 31.3 pg (ref 26.0–34.0)
MCHC: 33.6 g/dL (ref 30.0–36.0)
MCV: 93 fL (ref 80.0–100.0)
Monocytes Absolute: 0.5 10*3/uL (ref 0.1–1.0)
Monocytes Relative: 7 %
Neutro Abs: 3.6 10*3/uL (ref 1.7–7.7)
Neutrophils Relative %: 45 %
Platelets: 221 10*3/uL (ref 150–400)
RBC: 5.12 MIL/uL — ABNORMAL HIGH (ref 3.87–5.11)
RDW: 13.2 % (ref 11.5–15.5)
WBC: 7.7 10*3/uL (ref 4.0–10.5)
nRBC: 0 % (ref 0.0–0.2)

## 2018-12-23 LAB — I-STAT TROPONIN, ED
TROPONIN I, POC: 0.01 ng/mL (ref 0.00–0.08)
Troponin i, poc: 0 ng/mL (ref 0.00–0.08)

## 2018-12-23 LAB — INFLUENZA PANEL BY PCR (TYPE A & B)
INFLBPCR: NEGATIVE
Influenza A By PCR: NEGATIVE

## 2018-12-23 MED ORDER — SODIUM CHLORIDE 0.9 % IV BOLUS
1000.0000 mL | Freq: Once | INTRAVENOUS | Status: AC
  Administered 2018-12-23: 1000 mL via INTRAVENOUS

## 2018-12-23 MED ORDER — NAPROXEN 500 MG PO TABS: 500.0000 mg | ORAL_TABLET | Freq: Two times a day (BID) | ORAL | 0 refills | Status: DC

## 2018-12-23 MED ORDER — FENTANYL CITRATE (PF) 100 MCG/2ML IJ SOLN
50.0000 ug | Freq: Once | INTRAMUSCULAR | Status: AC
  Administered 2018-12-23: 50 ug via INTRAVENOUS
  Filled 2018-12-23: qty 2

## 2018-12-23 MED ORDER — KETOROLAC TROMETHAMINE 15 MG/ML IJ SOLN
15.0000 mg | Freq: Once | INTRAMUSCULAR | Status: AC
  Administered 2018-12-23: 15 mg via INTRAVENOUS
  Filled 2018-12-23: qty 1

## 2018-12-23 MED ORDER — BENZONATATE 100 MG PO CAPS: 100.0000 mg | ORAL_CAPSULE | Freq: Three times a day (TID) | ORAL | 0 refills | Status: DC | PRN

## 2018-12-23 MED ORDER — FLUTICASONE PROPIONATE 50 MCG/ACT NA SUSP: 1.0000 | Freq: Every day | NASAL | 0 refills | Status: DC

## 2018-12-23 NOTE — ED Notes (Signed)
Nurse drawing labs. 

## 2018-12-23 NOTE — ED Triage Notes (Signed)
Pt reports chest pain and associated shortness of breath since last night, cough for about a week with intermittent hot/cold feeling with no measured fever, afebrile on arrival.  Sharp pain in central chest, worsens with cough. No travel, no contact with anyone sick

## 2018-12-23 NOTE — Discharge Instructions (Addendum)
You were seen in the emergency department today for cough, chest pain, and trouble breathing. Your work-up in the emergency department was overall reassuring.  Your EKG and heart enzymes did not show findings consistent with a heart attack.  Your chest x-ray was normal.  Your influenza testing was negative.  At this time we suspect your symptoms are related to a virus that may be a cousin of the flu.  We are sending you home with the following supportive measures: -Flonase: This is been a decongestant steroid, use 1 spray in each nostril daily -Tessalon: This is a cough medicine, take this every 8 hours as needed

## 2018-12-23 NOTE — ED Notes (Signed)
Patient verbalizes understanding of discharge instructions. Opportunity for questioning and answers were provided. Armband removed by staff, pt discharged from ED.  

## 2018-12-23 NOTE — ED Provider Notes (Signed)
[facility] EMERGENCY DEPARTMENT Provider Note   CSN: [id number] Arrival date & time: 12/23/18  1707    History   Chief Complaint Chief Complaint  Patient presents with  . Chest Pain  . Shortness of Breath    [name] is a 48 y.o. female with a hx of tobacco abuse, gastric ulcer, GERD, & migraines who presents to the ER with complaints of chest pain x 2 days. Patient states her symptoms began with nasal congestion, sinus pain/headaches, and productive cough w/ clear to green mucous sputum. She then started to feel constantly short of breath and developed chest pain located in the substernal area that comes & goes lasting for 5-10 minutes in duration. Pain is triggered by coughing & with a deep breath. Breathing is more difficult with exertion or supine position. No other alleviating/aggravating factors. Had 1 episode of posttussive emesis last night, no other vomiting. Denies fever, chills, hematemesis, melena, hematochezia, lightheadedness, diaphoresis, syncope, leg pain/swelling, hemoptysis, recent surgery/trauma, recent long travel, hormone use, personal hx of cancer, or hx of DVT/PE. No recent travel or known covid 19 exposures.      HPI  Past Medical History:  Diagnosis Date  . Carpal tunnel syndrome of right wrist   . Ganglion cyst of wrist, right   . History of adenomatous polyp of colon    tubular adenoma's 09/ 2016;  06/ 2017  . History of Clostridium difficile colitis 05/21/2016  . History of frequent urinary tract infections   . History of gastric ulcer    clinical diagnosis, no prior EGD  . History of uterine fibroid   . Migraines   . OA (osteoarthritis)     Patient Active Problem List   Diagnosis Date Noted  . Influenza A 10/23/2017  . Ganglion cyst of wrist, right 09/16/2016  . Esophageal reflux 05/24/2016  . Anal fissure 10/23/2015  . Epidermoid cyst 09/02/2015  . 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 Tinea  pedis 05/26/2015  . Pain of left thumb 05/26/2015  . 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 De Quervain's tenosynovitis, left 04/14/2015  . Neck pain on right side 01/13/2015  . Carpal tunnel syndrome 12/30/2014  . Smoker 07/03/2014  . Family history of stomach cancer 07/03/2014    Past Surgical History:  Procedure Laterality Date  . CARPAL TUNNEL RELEASE Right 01/13/2017   Procedure: CARPAL TUNNEL RELEASE;  Surgeon: Iran Planas, MD;  Location: [facility];  Service: Orthopedics;  Laterality: Right;  . COLONOSCOPY  last one 01/ 03/ 2018   sigmoid scope  . GANGLION CYST EXCISION Right 01/13/2017   Procedure: REMOVAL GANGLION OF WRIST;  Surgeon: Iran Planas, MD;  Location: [facility];  Service: Orthopedics;  Laterality: Right;  . KNEE ARTHROSCOPY Left ~ 2015  . VAGINAL HYSTERECTOMY  2006     OB History   No obstetric history on file.      Home Medications    Prior to Admission medications   Medication Sig Start Date End Date Taking? Authorizing Provider  benzonatate (TESSALON) 200 MG capsule Take 1 capsule (200 mg total) by mouth 2 (two) times daily as needed for cough. 10/03/18   [name], NP  naproxen (NAPROSYN) 500 MG tablet Take 1 tablet (500 mg total) by mouth 2 (two) times daily with a meal. Patient not taking: Reported on 10/03/2018 09/13/18   Vanessa Kick, MD  rizatriptan (MAXALT-MLT) 10 MG disintegrating tablet Take 1 tablet (10 mg total) by  mouth as needed for migraine. May repeat in 2 hours if needed 06/13/18   Penumalli, Earlean Polka, MD  sennosides-docusate sodium (SENOKOT-S) 8.6-50 MG tablet Take 1 tablet by mouth daily. Patient not taking: Reported on 10/03/2018 03/17/18   [name], NP  SUMAtriptan (IMITREX) 50 MG tablet Take 1 (one) tablet by mouth at the start of headache. Repeat in 2 hours if headache persists. Do not take more than 2 tablets in 24 hours. 05/19/18   [name], NP  topiramate (TOPAMAX) 50 MG  tablet Take 1 tablet (50 mg total) by mouth 2 (two) times daily. 06/13/18   Penumalli, Earlean Polka, MD    Family History Family History  Problem Relation Age of Onset  . Diabetes Mother   . Hypertension Mother   . Ovarian cancer Mother   . Stomach cancer Maternal Uncle   . Heart disease Sister   . Healthy Brother   . Asthma Son   . Asthma Daughter   . Colon cancer Maternal Uncle 13  . Colon polyps Neg Hx   . Esophageal cancer Neg Hx   . Rectal cancer Neg Hx     Social History Social History   Tobacco Use  . Smoking status: Current Every Day Smoker    Packs/day: 1.00    Years: 29.00    Pack years: 29.00    Types: Cigarettes  . Smokeless tobacco: Never Used  Substance Use Topics  . Alcohol use: No    Alcohol/week: 0.0 standard drinks  . Drug use: No     Allergies   Sulfa antibiotics   Review of Systems Review of Systems  Constitutional: Negative for chills, diaphoresis and fever.  HENT: Positive for congestion, sinus pressure and sinus pain. Negative for ear pain and sore throat.   Eyes: Negative for visual disturbance.  Respiratory: Positive for cough and shortness of breath.   Cardiovascular: Positive for chest pain. Negative for palpitations and leg swelling.  Gastrointestinal: Positive for vomiting (post tussive x 1). Negative for abdominal pain, diarrhea and nausea.  Genitourinary: Negative for dysuria.  Neurological: Positive for headaches. Negative for dizziness, seizures, syncope, facial asymmetry, speech difficulty, weakness and numbness.  All other systems reviewed and are negative.    Physical Exam Updated Vital Signs BP 112/73 (BP Location: Right Arm)   Pulse 92   Temp 98.6 F (37 C) (Oral)   Resp 18   SpO2 95%   Physical Exam Vitals signs and nursing note reviewed.  Constitutional:      General: She is not in acute distress.    Appearance: She is well-developed.  HENT:     Head: Normocephalic and atraumatic.     Right Ear: Ear canal  normal. Tympanic membrane is not perforated, erythematous, retracted or bulging.     Left Ear: Ear canal normal. Tympanic membrane is not perforated, erythematous, retracted or bulging.     Ears:     Comments: No mastoid erythema/swelling/tenderness.     Nose: Congestion present.     Right Sinus: Frontal sinus tenderness present. No maxillary sinus tenderness.     Left Sinus: Frontal sinus tenderness present. No maxillary sinus tenderness.     Mouth/Throat:     Pharynx: Uvula midline. No oropharyngeal exudate or posterior oropharyngeal erythema.     Comments: Posterior oropharynx is symmetric appearing. Patient tolerating own secretions without difficulty. No trismus. No drooling. No hot potato voice. No swelling beneath the tongue, submandibular compartment is soft.  Eyes:     General:  Right eye: No discharge.        Left eye: No discharge.     Extraocular Movements: Extraocular movements intact.     Conjunctiva/sclera: Conjunctivae normal.     Pupils: Pupils are equal, round, and reactive to light.  Neck:     Musculoskeletal: Normal range of motion and neck supple. No edema or neck rigidity.  Cardiovascular:     Rate and Rhythm: Normal rate and regular rhythm.     Pulses:          Radial pulses are 2+ on the right side and 2+ on the left side.     Heart sounds: No murmur.  Pulmonary:     Effort: Pulmonary effort is normal. No respiratory distress.     Breath sounds: Decreased breath sounds (mild at bases) present. No wheezing or rales.  Abdominal:     General: There is no distension.     Palpations: Abdomen is soft.     Tenderness: There is no abdominal tenderness.  Musculoskeletal:     Right lower leg: No edema.     Left lower leg: No edema.  Lymphadenopathy:     Cervical: No cervical adenopathy.  Skin:    General: Skin is warm and dry.     Findings: No rash.  Neurological:     Mental Status: She is alert.  Psychiatric:        Behavior: Behavior normal.    ED  Treatments / Results  Labs (all labs ordered are listed, but only abnormal results are displayed) Labs Reviewed  CBC WITH DIFFERENTIAL/PLATELET - Abnormal; Notable for the following components:      Result Value   RBC 5.12 (*)    Hemoglobin 16.0 (*)    HCT 47.6 (*)    All other components within normal limits  BASIC METABOLIC PANEL - Abnormal; Notable for the following components:   Glucose, Bld 103 (*)    Calcium 10.4 (*)    Anion gap 16 (*)    All other components within normal limits  BASIC METABOLIC PANEL - Abnormal; Notable for the following components:   Potassium 3.4 (*)    Glucose, Bld 114 (*)    All other components within normal limits  INFLUENZA PANEL BY PCR (TYPE A & B)  I-STAT TROPONIN, ED  I-STAT BETA HCG BLOOD, ED ([facility], [facility], [facility] ONLY)  I-STAT TROPONIN, ED    EKG None  Radiology Dg Chest 2 View  Result Date: 12/23/2018 CLINICAL DATA:  Cough, central chest pain and headache. EXAM: CHEST - 2 VIEW COMPARISON:  Chest x-ray dated 10/23/2017. FINDINGS: The heart size and mediastinal contours are within normal limits. Both lungs are clear. The visualized skeletal structures are unremarkable. IMPRESSION: No active cardiopulmonary disease. No evidence of pneumonia or pulmonary edema. Electronically Signed   By: Franki Cabot M.D.   On: 12/23/2018 18:29    Procedures Procedures (including critical care time)  Medications Ordered in ED Medications - No data to display   Initial Impression / Assessment and Plan / ED Course  I have reviewed the triage vital signs and the nursing notes.  Pertinent labs & imaging results that were available during my care of the patient were reviewed by me and considered in my medical decision making (see chart for details).   Patient presents to the ER with complaints of chest pain & dyspnea in setting of cough/congestion. Patient nontoxic appearing, in no apparent distress, initial vitals WNL. Exam with some nasal congestion, frontal sinus  tenderness, decreased bibasilar breath sounds, and reproducibility of chest pain with anterior chest wall tenderness. Plan for labs, CXR, EKG. Analgesics administered.   Initial work-up reviewed:  CBC: No leukocytosis or anemia.  BMP: Hypercalcemia & elevated anion gap of uncertain significance- plan for fluids & re-assessment.  Preg test: negative Trop: Negative EKG: No STEMI.  CXR: Negative, no edema, effusion, infiltrate, or pneumothorax.   Plan for 1L of fluids, delta troponin, repeat BMP.   Delta trop negative, no STEMI- doubt ACS. Patient became mildly tachycardic throughout ER stay, resolved w/ fluids, overall seems PERC negative, doubt PE especially in setting of URI sxs. No widened mediastinum, symmetric pulses, doubt dissection. No wheezing, does not seem to require albuterol/steroids, does not appear in respiratory distress- nursing staff had documented some elevated RR, patient has not appeared tachypnic throughout ER stay on my multiple assessments. Overall sxs seem related to viral illness, suspect chest pain secondary to coughing w/ reproducibility on chest wall palpation. Considered Covid 19, no known exposures/travel, afebrile, does not meet current hospital criteria for testing, however will have patient quarantine per [facility] for precautionary measures. Supportive management otherwise, PCP follow up. I discussed results, treatment plan, need for follow-up, and return precautions with the patient. Provided opportunity for questions, patient confirmed understanding and is in agreement with plan.   Findings and plan of care discussed with supervising physician Dr. [name] Baltimore who is in agreement.   Blood pressure 98/76, pulse 92, temperature 98.2 F (36.8 C), temperature source Oral, resp. rate 20, height 5\' 1"  (1.549 m), weight 65.3 kg, SpO2 99 %.   Final Clinical Impressions(s) / ED Diagnoses   Final diagnoses:  Influenza-like illness    ED Discharge Orders         Ordered     fluticasone (FLONASE) 50 MCG/ACT nasal spray  Daily     12/23/18 2213    benzonatate (TESSALON) 100 MG capsule  3 times daily PRN     12/23/18 2213    naproxen (NAPROSYN) 500 MG tablet  2 times daily     12/23/18 2213           Amaryllis Dyke, PA-C 12/23/18 2233    Margette Fast, MD 12/24/18 1044

## 2019-01-05 ENCOUNTER — Ambulatory Visit: Payer: Self-pay | Attending: Nurse Practitioner | Admitting: Nurse Practitioner

## 2019-01-05 ENCOUNTER — Encounter: Payer: Self-pay | Admitting: Nurse Practitioner

## 2019-01-05 ENCOUNTER — Other Ambulatory Visit: Payer: Self-pay

## 2019-01-05 DIAGNOSIS — J111 Influenza due to unidentified influenza virus with other respiratory manifestations: Secondary | ICD-10-CM

## 2019-01-05 DIAGNOSIS — R69 Illness, unspecified: Principal | ICD-10-CM

## 2019-01-05 DIAGNOSIS — R51 Headache: Secondary | ICD-10-CM

## 2019-01-05 DIAGNOSIS — R05 Cough: Secondary | ICD-10-CM

## 2019-01-05 NOTE — Progress Notes (Signed)
Assessment & Plan:  April Hudson was seen today for hospitalization follow-up.  Diagnoses and all orders for this visit:  Influenza-like illness Asymptomatic. Return to work note given   Patient has been counseled on age-appropriate routine health concerns for screening and prevention. These are reviewed and up-to-date. Referrals have been placed accordingly. Immunizations are up-to-date or declined.    Subjective:   Chief Complaint  Patient presents with   Hospitalization Follow-up    Pt. need a release paper to go back to work due to the virus she was hospitalized.    [name] 48 y.o. female presents for telehealth visit for hospital follow up.    ED FOLLOW UP She states she was recently diagnosed with COVID and was instructed to stay out of work for 7 days. However it appears patient was seen in the ED on 12-22-2017 with complaints of headache, vomiting, chest pain and shortness of breath with cough. She was afebrile in the ED and noted for sinus tenderness and congestion. She was treated with 1L of IVF and BMP was drawn. PER ED NOTES: Considered Covid 19, no known exposures/travel, afebrile, does not meet current hospital criteria for testing, however will have patient quarantine per [facility] for precautionary measures. Supportive management otherwise, PCP follow up. I discussed results, treatment plan, need for follow-up, and return precautions with the patient. Provided opportunity for questions, patient confirmed understanding and is in agreement with plan. Influenza panel was negative.   Today patient is requesting a letter to return to work. In regards to symptoms she denies fatigue, myalgias, fever, vomiting or shortness of breath. She does still endorse minimal productive cough of normal white sputum and mild headaches "on and off".  As symptoms are not consistent with any active infectious disease I have instructed her that it is safe to return to work. She states she has  self quarantined since 12-23-2018.  Review of Systems  Constitutional: Negative for fever, malaise/fatigue and weight loss.  HENT: Negative.  Negative for congestion, nosebleeds, sinus pain and sore throat.   Eyes: Negative.  Negative for blurred vision, double vision and photophobia.  Respiratory: Positive for cough and sputum production. Negative for shortness of breath and wheezing.   Cardiovascular: Negative.  Negative for chest pain, palpitations, orthopnea and leg swelling.  Gastrointestinal: Negative.  Negative for heartburn, nausea and vomiting.  Musculoskeletal: Negative.  Negative for myalgias.  Neurological: Positive for headaches. Negative for dizziness, focal weakness and seizures.  Psychiatric/Behavioral: Negative.  Negative for suicidal ideas.    Past Medical History:  Diagnosis Date   Carpal tunnel syndrome of right wrist    Ganglion cyst of wrist, right    History of adenomatous polyp of colon    tubular adenoma's 09/ 2016;  06/ 2017   History of Clostridium difficile colitis 05/21/2016   History of frequent urinary tract infections    History of gastric ulcer    clinical diagnosis, no prior EGD   History of uterine fibroid    Migraines    OA (osteoarthritis)     Past Surgical History:  Procedure Laterality Date   CARPAL TUNNEL RELEASE Right 01/13/2017   Procedure: CARPAL TUNNEL RELEASE;  Surgeon: Iran Planas, MD;  Location: [facility];  Service: Orthopedics;  Laterality: Right;   COLONOSCOPY  last one 01/ 03/ 2018   sigmoid scope   GANGLION CYST EXCISION Right 01/13/2017   Procedure: REMOVAL GANGLION OF WRIST;  Surgeon: Iran Planas, MD;  Location: [facility];  Service: Orthopedics;  Laterality: Right;   KNEE ARTHROSCOPY Left ~ 2015   VAGINAL HYSTERECTOMY  2006    Family History  Problem Relation Age of Onset   Diabetes Mother    Hypertension Mother    Ovarian cancer Mother    Stomach cancer Maternal Uncle     Heart disease Sister    Healthy Brother    Asthma Son    Asthma Daughter    Colon cancer Maternal Uncle 52   Colon polyps Neg Hx    Esophageal cancer Neg Hx    Rectal cancer Neg Hx     Social History Reviewed with no changes to be made today.   Outpatient Medications Prior to Visit  Medication Sig Dispense Refill   benzonatate (TESSALON) 100 MG capsule Take 1 capsule (100 mg total) by mouth 3 (three) times daily as needed for cough. 21 capsule 0   fluticasone (FLONASE) 50 MCG/ACT nasal spray Place 1 spray into both nostrils daily. 16 g 0   naproxen (NAPROSYN) 500 MG tablet Take 1 tablet (500 mg total) by mouth 2 (two) times daily. (Patient not taking: Reported on 01/05/2019) 10 tablet 0   No facility-administered medications prior to visit.     Allergies  Allergen Reactions   Sulfa Antibiotics Hives and Rash       Objective:    There were no vitals taken for this visit. Wt Readings from Last 3 Encounters:  12/23/18 144 lb (65.3 kg)  10/03/18 155 lb 9.6 oz (70.6 kg)  08/01/18 150 lb (68 kg)       Patient has been counseled extensively about nutrition and exercise as well as the importance of adherence with medications and regular follow-up. The patient was given clear instructions to go to ER or return to medical center if symptoms don't improve, worsen or new problems develop. The patient verbalized understanding.   Follow-up: Return if symptoms worsen or fail to improve.   [name], FNP-BC [facility] and [facility], [place]   01/05/2019, 5:13 PM

## 2019-02-12 ENCOUNTER — Encounter: Payer: Self-pay | Admitting: *Deleted

## 2019-02-12 ENCOUNTER — Telehealth: Payer: Self-pay | Admitting: *Deleted

## 2019-02-12 NOTE — Telephone Encounter (Signed)
LVM requesting call back to update her chart.

## 2019-02-13 ENCOUNTER — Other Ambulatory Visit: Payer: Self-pay

## 2019-02-13 ENCOUNTER — Encounter: Payer: Self-pay | Admitting: Diagnostic Neuroimaging

## 2019-02-13 ENCOUNTER — Ambulatory Visit (INDEPENDENT_AMBULATORY_CARE_PROVIDER_SITE_OTHER): Payer: Self-pay | Admitting: Diagnostic Neuroimaging

## 2019-02-13 DIAGNOSIS — G43109 Migraine with aura, not intractable, without status migrainosus: Secondary | ICD-10-CM

## 2019-02-13 NOTE — Progress Notes (Signed)
Virtual Visit via Telephone Note  I connected with@ on 02/13/19 at  9:30 AM EDT by telephone and verified that I am speaking with the correct person using two identifiers.   I discussed the limitations, risks, security and privacy concerns of performing an evaluation and management service by telephone and the availability of in person appointments. I also discussed with the patient that there may be a patient responsible charge related to this service. The patient expressed understanding and agreed to proceed. Patient is at home and I am at the office.    History of Present Illness:  UPDATE (02/14/19, VRP): Since last visit, lost to follow up. Now trying to establish with [facility]. Continues with HA 3-4 per month.   PRIOR HPI (06/13/18): 48 year old female here for evaluation of headaches.  Patient reports headaches since age 48 years old with throbbing pounding sensation, nausea, dizziness, photophobia and phonophobia.  Patient was prescribed Imitrex at that time which seemed to help.  Then headache spontaneously resolved.  Headaches have returned approximately 3 months ago, with similar features in quality.  She describes global throbbing painful headaches with nausea, vomiting, dizziness, seeing spots, losing vision, lasting hours at a time.  Headaches occur 3 times per week.  Patient has been under more stress lately.  No other new symptoms.  Patient was prescribed Imitrex recently to see if this would help, but this is not relieve her headaches.    Observations/Objective:  - awake and alert   Assessment and Plan:  Dx:  1. Migraine with aura and without status migrainosus, not intractable      - continue topiramate 50mg  twice a day; drink plenty of water  - continue rizatriptan 10mg  as needed for breakthrough headache; may repeat x 1 after 2 hours; max 2 tabs per day or 8 per month  - To prevent or relieve headaches, try the following:   Cool Compress. Lie down and place a cool compress on your head.   Avoid headache triggers. If certain foods or odors seem to have triggered your migraines in the past, avoid them. A headache diary might help you identify triggers.   Include physical activity in your daily routine.   Manage stress. Find healthy ways to cope with the stressors, such as delegating tasks on your to-do list.   Practice relaxation techniques. Try deep breathing, yoga, massage and visualization.   Eat regularly. Eating regularly scheduled meals and maintaining a healthy diet might help prevent headaches. Also, drink plenty of fluids.   Follow a regular sleep schedule. Sleep deprivation might contribute to headaches  Consider biofeedback. With this mind-body technique, you learn to control certain bodily functions - such as muscle tension, heart rate and blood pressure - to prevent headaches or reduce headache pain.   Follow Up Instructions:  - Return for return to PCP.   I discussed the assessment and treatment plan with the patient. The patient was provided an opportunity to ask questions and all were answered. The patient agreed with the plan and demonstrated an understanding of the instructions.   The patient was advised to call back or seek an in-person evaluation if the symptoms worsen or if the condition fails to improve as anticipated.  I provided 15 minutes of non-face-to-face time during this encounter.    Penni Bombard, MD 4/43/1540, 0:86 AM Certified in Neurology, Neurophysiology and Neuroimaging  [facility] [address] [place], [place] [address] [phone]

## 2019-03-05 ENCOUNTER — Ambulatory Visit: Payer: Self-pay | Admitting: Diagnostic Neuroimaging

## 2019-03-06 ENCOUNTER — Encounter: Payer: Self-pay | Admitting: General Surgery

## 2019-03-06 ENCOUNTER — Telehealth: Payer: Self-pay | Admitting: General Surgery

## 2019-03-06 NOTE — Telephone Encounter (Signed)
Contacted the telephone numbers listed in the patients chart. No answer, left a message on cell and home number to call the office to pre-screen for virtual visit scheduled 03/07/2019

## 2019-03-06 NOTE — Telephone Encounter (Signed)
Pt said she is returning your call [phone]

## 2019-03-07 ENCOUNTER — Encounter: Payer: Self-pay | Admitting: Gastroenterology

## 2019-03-07 ENCOUNTER — Other Ambulatory Visit: Payer: Self-pay

## 2019-03-08 ENCOUNTER — Ambulatory Visit (INDEPENDENT_AMBULATORY_CARE_PROVIDER_SITE_OTHER): Payer: Self-pay | Admitting: Gastroenterology

## 2019-03-08 ENCOUNTER — Other Ambulatory Visit: Payer: Self-pay

## 2019-03-08 ENCOUNTER — Encounter: Payer: Self-pay | Admitting: Gastroenterology

## 2019-03-08 VITALS — Ht 61.5 in | Wt 142.0 lb

## 2019-03-08 DIAGNOSIS — R194 Change in bowel habit: Secondary | ICD-10-CM

## 2019-03-08 DIAGNOSIS — Z86010 Personal history of colonic polyps: Secondary | ICD-10-CM

## 2019-03-08 MED ORDER — PEG 3350-KCL-NA BICARB-NACL 420 G PO SOLR: 4000.0000 mL | ORAL | 0 refills | Status: DC

## 2019-03-08 MED FILL — PEG-3350 SOLUTION: 420 | 1 days supply | Qty: 4000 | Fill #0

## 2019-03-08 NOTE — Patient Instructions (Addendum)
We will arrange a colonoscopy at her soonest convenience for polyp surveillance.  IN the meantime she will start  taking citrucel (orange flavored) powder fiber supplement.  This may cause some bloating at first but that usually goes away. Begin with a small spoonful and work your way up to a large, heaping spoonful daily over a week.   Thank you for entrusting me with your care and choosing [facility].  Dr Ardis Hughs

## 2019-03-08 NOTE — Progress Notes (Signed)
Review of pertinent gastrointestinal problems: 1. Personal history of colonic polyps; 06/2015 colonoscopy Dr.Jacobs 4cm rectal TA with HGD (not near margin), flex sig 2017 with small residual/recurrent TA at the site; Flex sig 10/2016 site looked normal, biospies were normal as well.  Colonoscopy 06/2018 found 67mm cecal polyp removed piecemeal, cold snare (TA without HGD); also 17mm sigmoid polyp, removed (HP).  This service was provided via virtual visit.  AV was not possible at this time and so phone only was used.  The patient was located at home.  I was located in my office.  The patient did consent to this virtual visit and is aware of possible charges through their insurance for this visit.  The patient is an established patient.  My certified medical assistant, Grace Bushy, contributed to this visit by contacting the patient by phone 1 or 2 business days prior to the appointment and also followed up on the recommendations I made after the visit.  Time spent on virtual visit: 22 minutes   HPI: This is a very pleasant 48 year old woman whom I last saw at the time of her 06/2019 colonoscopy, see those resutls summarized above.  She has constipation lately, sore in her lower abdomen.  No changes in her diet, no new meds.  She has never had trouble like this before. She believes she stays hydrated.  No bleeding.  She tried a fleet enema, without improvement.  Has not tried anything else.  Cbc 12/2018 Hb 16  Chief complaint is history of polpys, change in bowels.  ROS: complete GI ROS as described in HPI, all other review negative.  Constitutional:  No unintentional weight loss   Past Medical History:  Diagnosis Date  . Carpal tunnel syndrome of right wrist   . Ganglion cyst of wrist, right   . History of adenomatous polyp of colon    tubular adenoma's 09/ 2016;  06/ 2017  . History of Clostridium difficile colitis 05/21/2016  . History of frequent urinary tract infections   . History of  gastric ulcer    clinical diagnosis, no prior EGD  . History of uterine fibroid   . Migraines   . OA (osteoarthritis)     Past Surgical History:  Procedure Laterality Date  . CARPAL TUNNEL RELEASE Right 01/13/2017   Procedure: CARPAL TUNNEL RELEASE;  Surgeon: Iran Planas, MD;  Location: [facility];  Service: Orthopedics;  Laterality: Right;  . COLONOSCOPY  last one 01/ 03/ 2018   sigmoid scope  . GANGLION CYST EXCISION Right 01/13/2017   Procedure: REMOVAL GANGLION OF WRIST;  Surgeon: Iran Planas, MD;  Location: [facility];  Service: Orthopedics;  Laterality: Right;  . KNEE ARTHROSCOPY Left ~ 2015  . VAGINAL HYSTERECTOMY  2006    Current Outpatient Medications  Medication Sig Dispense Refill  . benzonatate (TESSALON) 100 MG capsule Take 1 capsule (100 mg total) by mouth 3 (three) times daily as needed for cough. 21 capsule 0  . fluticasone (FLONASE) 50 MCG/ACT nasal spray Place 1 spray into both nostrils daily. 16 g 0  . naproxen (NAPROSYN) 500 MG tablet Take 1 tablet (500 mg total) by mouth 2 (two) times daily. 10 tablet 0  . topiramate (TOPAMAX) 50 MG tablet Take 50 mg by mouth 2 (two) times daily.     No current facility-administered medications for this visit.     Allergies as of 03/08/2019 - Review Complete 03/08/2019  Allergen Reaction Noted  . Sulfa antibiotics Hives and  Rash 07/03/2014    Family History  Problem Relation Age of Onset  . Diabetes Mother   . Hypertension Mother   . Ovarian cancer Mother   . Stomach cancer Maternal Uncle   . Heart disease Sister   . Healthy Brother   . Asthma Son   . Asthma Daughter   . Colon cancer Maternal Uncle 2  . Colon polyps Neg Hx   . Esophageal cancer Neg Hx   . Rectal cancer Neg Hx     Social History   Socioeconomic History  . Marital status: Significant Other    Spouse name: Not on file  . Number of children: 2  . Years of education: Not on file  . Highest education level: GED  or equivalent  Occupational History  . Occupation: unemployed    Comment: [employer]  . Financial resource strain: Not on file  . Food insecurity:    Worry: Not on file    Inability: Not on file  . Transportation needs:    Medical: Not on file    Non-medical: Not on file  Tobacco Use  . Smoking status: Current Every Day Smoker    Packs/day: 1.00    Years: 29.00    Pack years: 29.00    Types: Cigarettes  . Smokeless tobacco: Never Used  Substance and Sexual Activity  . Alcohol use: No    Alcohol/week: 0.0 standard drinks  . Drug use: No  . Sexual activity: Yes    Birth control/protection: Surgical  Lifestyle  . Physical activity:    Days per week: Not on file    Minutes per session: Not on file  . Stress: Not on file  Relationships  . Social connections:    Talks on phone: Not on file    Gets together: Not on file    Attends religious service: Not on file    Active member of club or organization: Not on file    Attends meetings of clubs or organizations: Not on file    Relationship status: Not on file  . Intimate partner violence:    Fear of current or ex partner: Not on file    Emotionally abused: Not on file    Physically abused: Not on file    Forced sexual activity: Not on file  Other Topics Concern  . Not on file  Social History Narrative   Lives with husband in a one story home.     Has 6 children (2 biological).     Works at [employer]..  Education: GED     Physical Exam: Unable to perform because this was a "telemed visit" due to current Covid-19 pandemic  Assessment and plan: 48 y.o. female with history of colon polyps, change in bowels.  Unclear why she has become a bit constipated lately but I think it is very unlikely from anything serious since she had a colonoscopy 06/2018.  I recommended that she stay hydrated and try OTC fiber supplements for now.  She needs surveillance examination given piecemeal resected cecal polyp 06/2018,  we will arrange for that at her soonest convenience.  Please see the "Patient Instructions" section for addition details about the plan.  [name], MD [facility] 03/08/2019, 1:29 PM

## 2019-03-12 ENCOUNTER — Telehealth: Payer: Self-pay | Admitting: *Deleted

## 2019-03-12 NOTE — Telephone Encounter (Signed)

## 2019-03-13 ENCOUNTER — Other Ambulatory Visit: Payer: Self-pay

## 2019-03-13 ENCOUNTER — Ambulatory Visit (AMBULATORY_SURGERY_CENTER): Payer: Self-pay | Admitting: Gastroenterology

## 2019-03-13 ENCOUNTER — Encounter: Payer: Self-pay | Admitting: Gastroenterology

## 2019-03-13 VITALS — BP 112/73 | HR 76 | Temp 98.7°F | Resp 23 | Ht 61.0 in | Wt 142.0 lb

## 2019-03-13 DIAGNOSIS — Z86010 Personal history of colonic polyps: Secondary | ICD-10-CM

## 2019-03-13 DIAGNOSIS — D120 Benign neoplasm of cecum: Secondary | ICD-10-CM

## 2019-03-13 DIAGNOSIS — K5730 Diverticulosis of large intestine without perforation or abscess without bleeding: Secondary | ICD-10-CM

## 2019-03-13 MED ORDER — SODIUM CHLORIDE 0.9 % IV SOLN: 500.0000 mL | Freq: Once | INTRAVENOUS | Status: DC

## 2019-03-13 NOTE — Progress Notes (Signed)
Called to room to assist during endoscopic procedure.  Patient ID and intended procedure confirmed with present staff. Received instructions for my participation in the procedure from the performing physician.  

## 2019-03-13 NOTE — Op Note (Signed)
[name] Procedure Date: 03/13/2019 3:00 PM MRN: [id number] Endoscopist: Milus Banister , MD Age: 48 Referring MD:  Date of Birth: March 09, 1971 Gender: Female Account #: [id number] Procedure:                Colonoscopy Indications:              High risk colon cancer surveillance: Personal                            history of colonic polyps; 06/2015                            colonoscopyDr.Jacobs 4cm rectal TA with HGD (not                            near margin),flex sig 2017 with small                            residual/recurrent TA atthe site; Flex sig 10/2016                            site looked normal, biospies were normal as well.                            Colonoscopy 06/2018 found 30mm cecal polyp removed                            piecemeal, cold snare (TA without HGD); also 79mm                            sigmoid polyp, removed (HP). Medicines:                Monitored Anesthesia Care Procedure:                Pre-Anesthesia Assessment:                           - Prior to the procedure, a History and Physical                            was performed, and patient medications and                            allergies were reviewed. The patient's tolerance of                            previous anesthesia was also reviewed. The risks                            and benefits of the procedure and the sedation                            options and risks were discussed with the patient.  All questions were answered, and informed consent                            was obtained. Prior Anticoagulants: The patient has                            taken no previous anticoagulant or antiplatelet                            agents. ASA Grade Assessment: II - A patient with                            mild systemic disease. After reviewing the risks                            and benefits, the patient was deemed in                         satisfactory condition to undergo the procedure.                           After obtaining informed consent, the colonoscope                            was passed under direct vision. Throughout the                            procedure, the patient's blood pressure, pulse, and                            oxygen saturations were monitored continuously. The                            Colonoscope was introduced through the anus and                            advanced to the the cecum, identified by                            appendiceal orifice and ileocecal valve. The                            colonoscopy was performed without difficulty. The                            patient tolerated the procedure well. The quality                            of the bowel preparation was good. The ileocecal                            valve, appendiceal orifice, and rectum were  photographed. Scope In: 3:10:18 PM Scope Out: 3:23:23 PM Scope Withdrawal Time: 0 hours 10 minutes 43 seconds  Total Procedure Duration: 0 hours 13 minutes 5 seconds  Findings:                 A 10 mm polyp was found in the cecum, presumed to                            be the site of the 2019 piecemeal polypectomy. The                            polyp was sessile. The polyp was removed with a                            cold snare. Resection and retrieval were complete.                           Multiple small and large-mouthed diverticula were                            found in the left colon.                           The site of 2016 rectal polypectomy, tattooing was                            evident and there was no recurrent polyp at the                            site.                           The exam was otherwise without abnormality on                            direct and retroflexion views. Complications:            No immediate complications. Estimated blood loss:                             None. Estimated Blood Loss:     Estimated blood loss: none. Impression:               - One 10 mm polyp in the cecum, removed with a cold                            snare. Resected and retrieved.                           - Diverticulosis in the left colon.                           - The site of 2016 rectal polypectomy, tattooing                            was evident  and there was no recurrent polyp at the                            site.                           - The examination was otherwise normal on direct                            and retroflexion views. Recommendation:           - Patient has a contact number available for                            emergencies. The signs and symptoms of potential                            delayed complications were discussed with the                            patient. Return to normal activities tomorrow.                            Written discharge instructions were provided to the                            patient.                           - Resume previous diet.                           - Continue present medications.                           You will receive a letter within 2-3 weeks with the                            pathology results and my final recommendations.                           If the polyp(s) is proven to be 'pre-cancerous' on                            pathology, you will need repeat colonoscopy in 3                            years. Milus Banister, MD 03/13/2019 3:30:35 PM This report has been signed electronically.

## 2019-03-13 NOTE — Progress Notes (Signed)
To PACU, VSS. Report to [email or web address] 

## 2019-03-13 NOTE — Progress Notes (Signed)
Pt's states no medical or surgical changes since previsit or office visit. 

## 2019-03-13 NOTE — Patient Instructions (Signed)
Information on polyps and diverticulosis given to you today.  Await pathology results.  YOU HAD AN ENDOSCOPIC PROCEDURE TODAY AT THE [facility]:   Refer to the procedure report that was given to you for any specific questions about what was found during the examination.  If the procedure report does not answer your questions, please call your gastroenterologist to clarify.  If you requested that your care partner not be given the details of your procedure findings, then the procedure report has been included in a sealed envelope for you to review at your convenience later.  YOU SHOULD EXPECT: Some feelings of bloating in the abdomen. Passage of more gas than usual.  Walking can help get rid of the air that was put into your GI tract during the procedure and reduce the bloating. If you had a lower endoscopy (such as a colonoscopy or flexible sigmoidoscopy) you may notice spotting of blood in your stool or on the toilet paper. If you underwent a bowel prep for your procedure, you may not have a normal bowel movement for a few days.  Please Note:  You might notice some irritation and congestion in your nose or some drainage.  This is from the oxygen used during your procedure.  There is no need for concern and it should clear up in a day or so.  SYMPTOMS TO REPORT IMMEDIATELY:   Following lower endoscopy (colonoscopy or flexible sigmoidoscopy):  Excessive amounts of blood in the stool  Significant tenderness or worsening of abdominal pains  Swelling of the abdomen that is new, acute  Fever of 100F or higher   For urgent or emergent issues, a gastroenterologist can be reached at any hour by calling [phone].   DIET:  We do recommend a small meal at first, but then you may proceed to your regular diet.  Drink plenty of fluids but you should avoid alcoholic beverages for 24 hours.  ACTIVITY:  You should plan to take it easy for the rest of today and you should NOT DRIVE or use  heavy machinery until tomorrow (because of the sedation medicines used during the test).    FOLLOW UP: Our staff will call the number listed on your records 48-72 hours following your procedure to check on you and address any questions or concerns that you may have regarding the information given to you following your procedure. If we do not reach you, we will leave a message.  We will attempt to reach you two times.  During this call, we will ask if you have developed any symptoms of COVID 19. If you develop any symptoms (ie: fever, flu-like symptoms, shortness of breath, cough etc.) before then, please call [phone].  If you test positive for Covid 19 in the 2 weeks post procedure, please call and report this information to us.    If any biopsies were taken you will be contacted by phone or by letter within the next 1-3 weeks.  Please call us at [phone] if you have not heard about the biopsies in 3 weeks.    SIGNATURES/CONFIDENTIALITY: You and/or your care partner have signed paperwork which will be entered into your electronic medical record.  These signatures attest to the fact that that the information above on your After Visit Summary has been reviewed and is understood.  Full responsibility of the confidentiality of this discharge information lies with you and/or your care-partner. 

## 2019-03-14 NOTE — Progress Notes (Signed)
This encounter was created in error - please disregard.

## 2019-03-15 ENCOUNTER — Telehealth: Payer: Self-pay

## 2019-03-15 ENCOUNTER — Telehealth: Payer: Self-pay | Admitting: *Deleted

## 2019-03-15 NOTE — Telephone Encounter (Signed)
2nd attempt follow up call made to pt with no answer, left message for pt to call if she is having any problems eating or drinking, or any problems with usual activities.  Also instructed pt to call if any s/s of covid, any fever or exposure to covid.

## 2019-03-15 NOTE — Telephone Encounter (Signed)
  Follow up Call-  Call back number 03/13/2019 06/21/2018 10/06/2016  Post procedure Call Back phone  # [phone]  Permission to leave phone message Yes Yes Yes  Some recent data might be hidden     Patient questions:  Message left to call us if necessary.

## 2019-03-15 NOTE — Telephone Encounter (Signed)
  Follow up Call-  Call back number 03/13/2019 06/21/2018 10/06/2016  Post procedure Call Back phone  # [phone]  Permission to leave phone message Yes Yes Yes  Some recent data might be hidden     Patient questions:  Do you have a fever, pain , or abdominal swelling? Yes.   Pain Score  8 *  Have you tolerated food without any problems? No.  Have you been able to return to your normal activities? No.  Do you have any questions about your discharge instructions: Diet   No. Medications  No. Follow up visit  Yes.    Do you have questions or concerns about your Care? No.  Actions: * If pain score is 4 or above: Physician/ provider Notified : [name], MD.  Pt called back after she heard my message I left her regarding if she was having any problems after her colonoscopy on 03/13/2019.  Pt states she is not eating and drinking like normal and has "a sharp pain to her stomach" which she then admits is actually  lower abdominal pain of an "8" and it is similar to the pain she has had in the last 3 weeks but no bleeding and no vomitting, just nausea.  States she ate a ham sandwich at 1130 this morning( about an hour ago).

## 2019-03-19 ENCOUNTER — Other Ambulatory Visit: Payer: Self-pay | Admitting: Gastroenterology

## 2019-03-19 ENCOUNTER — Encounter: Payer: Self-pay | Admitting: Gastroenterology

## 2019-03-19 ENCOUNTER — Telehealth: Payer: Self-pay

## 2019-03-19 DIAGNOSIS — R194 Change in bowel habit: Secondary | ICD-10-CM

## 2019-03-19 DIAGNOSIS — R1030 Lower abdominal pain, unspecified: Secondary | ICD-10-CM

## 2019-03-19 NOTE — Telephone Encounter (Signed)
Spoke to patient. CT scheduled for 03/20/19. She will come by the office today to pick up contrast and instructions. Patient voiced understanding.

## 2019-03-19 NOTE — Telephone Encounter (Signed)
lmom for patient to call back 

## 2019-03-19 NOTE — Telephone Encounter (Signed)
Please call her to arrange a CT scan abdomen pelvis with IV and oral contrast this week sometime for lower abdominal pains, change in bowels.

## 2019-03-19 NOTE — Telephone Encounter (Signed)
I spoke to patient this morning. She is scheduled for CT abdomen and pelvis with IV and oral contrast on 03/20/19. She will come by our office later today to pick up contrast and instructions. All questions answered and patient voiced understanding.

## 2019-03-19 NOTE — Telephone Encounter (Signed)
Pt returned your call.  

## 2019-03-20 ENCOUNTER — Other Ambulatory Visit: Payer: Self-pay

## 2019-03-20 ENCOUNTER — Ambulatory Visit (INDEPENDENT_AMBULATORY_CARE_PROVIDER_SITE_OTHER)
Admission: RE | Admit: 2019-03-20 | Discharge: 2019-03-20 | Disposition: A | Discharge status: Home / Self Care | Payer: Self-pay | Source: Ambulatory Visit | Attending: Gastroenterology | Admitting: Gastroenterology

## 2019-03-20 DIAGNOSIS — R194 Change in bowel habit: Secondary | ICD-10-CM

## 2019-03-20 DIAGNOSIS — R1030 Lower abdominal pain, unspecified: Secondary | ICD-10-CM

## 2019-03-20 MED ORDER — IOHEXOL 300 MG/ML  SOLN
100.0000 mL | Freq: Once | INTRAMUSCULAR | Status: AC | PRN
  Administered 2019-03-20: 100 mL via INTRAVENOUS

## 2019-03-21 ENCOUNTER — Other Ambulatory Visit: Payer: Self-pay | Admitting: Gastroenterology

## 2019-03-21 MED ORDER — LINACLOTIDE 72 MCG PO CAPS: 72.0000 ug | ORAL_CAPSULE | Freq: Every day | ORAL | 5 refills | Status: DC

## 2019-04-30 ENCOUNTER — Telehealth: Payer: Self-pay | Admitting: General Surgery

## 2019-04-30 NOTE — Telephone Encounter (Signed)
Covid-19 screening questions   Do you now or have you had a fever in the last 14 days no   Do you have any respiratory symptoms of shortness of breath or cough now or in the last 14 days no  Do you have any family members or close contacts with diagnosed or suspected Covid-19 in the past 14 days no  Have you been tested for Covid-19 and found to be positive no          

## 2019-04-30 NOTE — Telephone Encounter (Signed)
Left  Voicemail for patient to contact the office to pre-screen for virtual visit.

## 2019-04-30 NOTE — Telephone Encounter (Signed)
Pt responded "no" to all screening questions °

## 2019-05-01 ENCOUNTER — Ambulatory Visit: Payer: Self-pay | Admitting: Gastroenterology

## 2019-05-28 ENCOUNTER — Ambulatory Visit: Payer: Self-pay | Attending: Nurse Practitioner | Admitting: Nurse Practitioner

## 2019-05-28 ENCOUNTER — Encounter: Payer: Self-pay | Admitting: Nurse Practitioner

## 2019-05-28 ENCOUNTER — Other Ambulatory Visit: Payer: Self-pay

## 2019-05-28 DIAGNOSIS — M25511 Pain in right shoulder: Secondary | ICD-10-CM

## 2019-05-28 MED ORDER — CYCLOBENZAPRINE HCL 5 MG PO TABS: 5.0000 mg | ORAL_TABLET | Freq: Three times a day (TID) | ORAL | 1 refills | Status: DC | PRN

## 2019-05-28 MED ORDER — NAPROXEN 500 MG PO TABS: 500.0000 mg | ORAL_TABLET | Freq: Two times a day (BID) | ORAL | 1 refills | Status: DC

## 2019-05-28 MED FILL — NAPROXEN 500 MG TABLET: 500 | 30 days supply | Qty: 60 | Fill #0

## 2019-05-28 MED FILL — CYCLOBENZAPRINE 5 MG TABLET: 5 | 20 days supply | Qty: 60 | Fill #0

## 2019-05-28 NOTE — Progress Notes (Signed)
Virtual Visit via Telephone Note Due to national recommendations of social distancing due to [place] 19, telehealth visit is felt to be most appropriate for this patient at this time.  I discussed the limitations, risks, security and privacy concerns of performing an evaluation and management service by telephone and the availability of in person appointments. I also discussed with the patient that there may be a patient responsible charge related to this service. The patient expressed understanding and agreed to proceed.    I connected with April Hudson on 05/28/19  at   9:10 AM EDT  EDT by telephone and verified that I am speaking with the correct person using two identifiers.   Consent I discussed the limitations, risks, security and privacy concerns of performing an evaluation and management service by telephone and the availability of in person appointments. I also discussed with the patient that there may be a patient responsible charge related to this service. The patient expressed understanding and agreed to proceed.   Location of Patient: Private Residence   Location of Provider: [place] and [place] participating in Telemedicine visit: Geryl Rankins FNP-BC [place]    History of Present Illness: Telemedicine visit for: Right Shoulder Pain  Shoulder Pain Patient complaints of right shoulder pain. She denies any trauma or injury to her shoulder. The pain is described as aching and sharp.  The onset of the pain was 3 weeks ago.  The pain occurs every day and can lasts minutes to hours. Location is global, and radiates down the arm with numbness and tingling of right middle and index finger. No history of dislocation. Symptoms are aggravated by reaching, lifting or raising her arm above her shoulder. Symptoms are diminished by rest.  Limited activities include: no limitations. No stiffness, no weakness, no swelling, no crepitus  noted is reported. She has been taking OTC tylenol and Advil over the counter with no relief of pain.     Past Medical History:  Diagnosis Date  . Carpal tunnel syndrome of right wrist   . Ganglion cyst of wrist, right   . History of adenomatous polyp of colon    tubular adenoma's 09/ 2016;  06/ 2017  . History of Clostridium difficile colitis 05/21/2016  . History of frequent urinary tract infections   . History of gastric ulcer    clinical diagnosis, no prior EGD  . History of uterine fibroid   . Migraines   . OA (osteoarthritis)     Past Surgical History:  Procedure Laterality Date  . CARPAL TUNNEL RELEASE Right 01/13/2017   Procedure: CARPAL TUNNEL RELEASE;  Surgeon: Iran Planas, MD;  Location: [facility];  Service: Orthopedics;  Laterality: Right;  . COLONOSCOPY  last one 01/ 03/ 2018   sigmoid scope  . GANGLION CYST EXCISION Right 01/13/2017   Procedure: REMOVAL GANGLION OF WRIST;  Surgeon: Iran Planas, MD;  Location: [facility];  Service: Orthopedics;  Laterality: Right;  . KNEE ARTHROSCOPY Left ~ 2015  . VAGINAL HYSTERECTOMY  2006    Family History  Problem Relation Age of Onset  . Diabetes Mother   . Hypertension Mother   . Ovarian cancer Mother   . Stomach cancer Maternal Uncle   . Heart disease Sister   . Healthy Brother   . Asthma Son   . Asthma Daughter   . Colon cancer Maternal Uncle 62  . Colon polyps Neg Hx   . Esophageal cancer  Neg Hx   . Rectal cancer Neg Hx     Social History   Socioeconomic History  . Marital status: Significant Other    Spouse name: Not on file  . Number of children: 2  . Years of education: Not on file  . Highest education level: GED or equivalent  Occupational History  . Occupation: unemployed    Comment: [employer]  . Financial resource strain: Not on file  . Food insecurity    Worry: Not on file    Inability: Not on file  . Transportation needs    Medical: Not on file     Non-medical: Not on file  Tobacco Use  . Smoking status: Current Every Day Smoker    Packs/day: 1.00    Years: 29.00    Pack years: 29.00    Types: Cigarettes  . Smokeless tobacco: Never Used  Substance and Sexual Activity  . Alcohol use: No    Alcohol/week: 0.0 standard drinks  . Drug use: No  . Sexual activity: Yes    Birth control/protection: Surgical  Lifestyle  . Physical activity    Days per week: Not on file    Minutes per session: Not on file  . Stress: Not on file  Relationships  . Social Herbalist on phone: Not on file    Gets together: Not on file    Attends religious service: Not on file    Active member of club or organization: Not on file    Attends meetings of clubs or organizations: Not on file    Relationship status: Not on file  Other Topics Concern  . Not on file  Social History Narrative   Lives with husband in a one story home.     Has 6 children (2 biological).     Works at [employer]..  Education: GED     Observations/Objective: Awake, alert and oriented x 3   Review of Systems  Constitutional: Negative for fever, malaise/fatigue and weight loss.  HENT: Negative.  Negative for nosebleeds.   Eyes: Negative.  Negative for blurred vision, double vision and photophobia.  Respiratory: Negative.  Negative for cough and shortness of breath.   Cardiovascular: Negative.  Negative for chest pain, palpitations and leg swelling.  Gastrointestinal: Negative.  Negative for heartburn, nausea and vomiting.  Musculoskeletal: Positive for joint pain. Negative for myalgias.  Neurological: Negative.  Negative for dizziness, focal weakness, seizures and headaches.  Psychiatric/Behavioral: Negative.  Negative for suicidal ideas.    Assessment and Plan: April Hudson was seen today for hand pain.  Diagnoses and all orders for this visit:  Acute pain of right shoulder -     naproxen (NAPROSYN) 500 MG tablet; Take 1 tablet (500 mg total) by mouth 2  (two) times daily. -     cyclobenzaprine (FLEXERIL) 5 MG tablet; Take 1 tablet (5 mg total) by mouth 3 (three) times daily as needed for muscle spasms. Work on losing weight to help reduce joint pain. May alternate with heat and ice application for pain relief. May also alternate with acetaminophen and Ibuprofen as prescribed pain relief. Other alternatives include massage, acupuncture and water aerobics.  You must stay active and avoid a sedentary lifestyle.   Follow Up Instructions Return in about 4 weeks (around 06/25/2019), or if symptoms worsen or fail to improve.     I discussed the assessment and treatment plan with the patient. The patient was provided an opportunity to ask questions  and all were answered. The patient agreed with the plan and demonstrated an understanding of the instructions.   The patient was advised to call back or seek an in-person evaluation if the symptoms worsen or if the condition fails to improve as anticipated.  I provided 18 minutes of non-face-to-face time during this encounter including median intraservice time, reviewing previous notes, labs, imaging, medications and explaining diagnosis and management.  [name], FNP-BC

## 2019-07-07 ENCOUNTER — Encounter (HOSPITAL_COMMUNITY): Payer: Self-pay

## 2019-07-07 ENCOUNTER — Ambulatory Visit (HOSPITAL_COMMUNITY)
Admission: EM | Admit: 2019-07-07 | Discharge: 2019-07-07 | Disposition: A | Discharge status: Home / Self Care | Payer: HRSA Program | Attending: Emergency Medicine | Admitting: Emergency Medicine

## 2019-07-07 ENCOUNTER — Other Ambulatory Visit: Payer: Self-pay

## 2019-07-07 DIAGNOSIS — Z20828 Contact with and (suspected) exposure to other viral communicable diseases: Secondary | ICD-10-CM | POA: Insufficient documentation

## 2019-07-07 DIAGNOSIS — Z20822 Contact with and (suspected) exposure to covid-19: Secondary | ICD-10-CM

## 2019-07-07 NOTE — ED Triage Notes (Signed)
Pt state she has to have a Covid test to return to work.

## 2019-07-07 NOTE — ED Provider Notes (Signed)
[facility]    CSN: [id number] Arrival date & time: 07/07/19  1507      History   Chief Complaint Chief Complaint  Patient presents with  . covid test    [name] is a 48 y.o. female.   April Hudson presents with requests covid testing in order to start a new job. She is getting hired at [employer], working in [employer], therefore requires a screening test before she can start. Denies any known contact of symptoms of Covid-19.    ROS per HPI, negative if not otherwise mentioned.      Past Medical History:  Diagnosis Date  . Carpal tunnel syndrome of right wrist   . Ganglion cyst of wrist, right   . History of adenomatous polyp of colon    tubular adenoma's 09/ 2016;  06/ 2017  . History of Clostridium difficile colitis 05/21/2016  . History of frequent urinary tract infections   . History of gastric ulcer    clinical diagnosis, no prior EGD  . History of uterine fibroid   . Migraines   . OA (osteoarthritis)     Patient Active Problem List   Diagnosis Date Noted  . Influenza A 10/23/2017  . Ganglion cyst of wrist, right 09/16/2016  . Esophageal reflux 05/24/2016  . Anal fissure 10/23/2015  . Epidermoid cyst 09/02/2015  . 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 Tinea pedis 05/26/2015  . Pain of left thumb 05/26/2015  . 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 De Quervain's tenosynovitis, left 04/14/2015  . Neck pain on right side 01/13/2015  . Carpal tunnel syndrome 12/30/2014  . Smoker 07/03/2014  . Family history of stomach cancer 07/03/2014    Past Surgical History:  Procedure Laterality Date  . CARPAL TUNNEL RELEASE Right 01/13/2017   Procedure: CARPAL TUNNEL RELEASE;  Surgeon: Iran Planas, MD;  Location: [facility];  Service: Orthopedics;  Laterality: Right;  . COLONOSCOPY  last one 01/ 03/ 2018   sigmoid scope  . GANGLION CYST EXCISION Right 01/13/2017   Procedure: REMOVAL  GANGLION OF WRIST;  Surgeon: Iran Planas, MD;  Location: [name];  Service: Orthopedics;  Laterality: Right;  . KNEE ARTHROSCOPY Left ~ 2015  . VAGINAL HYSTERECTOMY  2006    OB History   No obstetric history on file.      Home Medications    Prior to Admission medications   Medication Sig Start Date End Date Taking? Authorizing Provider  benzonatate (TESSALON) 100 MG capsule Take 1 capsule (100 mg total) by mouth 3 (three) times daily as needed for cough. 12/23/18   Petrucelli, Samantha R, PA-C  cyclobenzaprine (FLEXERIL) 5 MG tablet Take 1 tablet (5 mg total) by mouth 3 (three) times daily as needed for muscle spasms. 05/28/19   [name], NP  fluticasone (FLONASE) 50 MCG/ACT nasal spray Place 1 spray into both nostrils daily. Patient not taking: Reported on 05/28/2019 12/23/18   Petrucelli, Glynda Jaeger, PA-C  linaclotide (LINZESS) 72 MCG capsule Take 1 capsule (72 mcg total) by mouth daily. 03/21/19   Milus Banister, MD  naproxen (NAPROSYN) 500 MG tablet Take 1 tablet (500 mg total) by mouth 2 (two) times daily. 05/28/19   [name], NP  topiramate (TOPAMAX) 50 MG tablet Take 50 mg by mouth 2 (two) times daily.    [provider]    Family History Family History  Problem Relation Age of Onset  . Diabetes Mother   .  Hypertension Mother   . Ovarian cancer Mother   . Stomach cancer Maternal Uncle   . Heart disease Sister   . Healthy Brother   . Asthma Son   . Asthma Daughter   . Colon cancer Maternal Uncle 5  . Colon polyps Neg Hx   . Esophageal cancer Neg Hx   . Rectal cancer Neg Hx     Social History Social History   Tobacco Use  . Smoking status: Current Every Day Smoker    Packs/day: 1.00    Years: 29.00    Pack years: 29.00    Types: Cigarettes  . Smokeless tobacco: Never Used  Substance Use Topics  . Alcohol use: No    Alcohol/week: 0.0 standard drinks  . Drug use: No     Allergies   Sulfa antibiotics   Review  of Systems Review of Systems   Physical Exam Triage Vital Signs ED Triage Vitals  Enc Vitals Group     BP 07/07/19 1527 (!) 125/94     Pulse Rate 07/07/19 1527 89     Resp 07/07/19 1527 16     Temp 07/07/19 1527 98.5 F (36.9 C)     Temp src --      SpO2 07/07/19 1527 100 %     Weight 07/07/19 1525 130 lb (59 kg)     Height --      Head Circumference --      Peak Flow --      Pain Score 07/07/19 1525 0     Pain Loc --      Pain Edu? --      Excl. in [place]? --    No data found.  Updated Vital Signs BP (!) 125/94 (BP Location: Right Arm)   Pulse 89   Temp 98.5 F (36.9 C)   Resp 16   Wt 130 lb (59 kg)   SpO2 100%   BMI 24.56 kg/m    Physical Exam Constitutional:      General: She is not in acute distress.    Appearance: She is well-developed.  Cardiovascular:     Rate and Rhythm: Normal rate.  Pulmonary:     Effort: Pulmonary effort is normal.  Skin:    General: Skin is warm and dry.  Neurological:     Mental Status: She is alert and oriented to person, place, and time.      UC Treatments / Results  Labs (all labs ordered are listed, but only abnormal results are displayed) Labs Reviewed  NOVEL CORONAVIRUS, NAA (HOSP ORDER, SEND-OUT TO REF LAB; TAT 18-24 HRS)    EKG   Radiology No results found.  Procedures Procedures (including critical care time)  Medications Ordered in UC Medications - No data to display  Initial Impression / Assessment and Plan / UC Course  I have reviewed the triage vital signs and the nursing notes.  Pertinent labs & imaging results that were available during my care of the patient were reviewed by me and considered in my medical decision making (see chart for details).     covid collected and pending. Will notify of any positive findings and if any changes to treatment are needed.  To follow on mychart for negative results. Patient verbalized understanding and agreeable to plan.   Final Clinical Impressions(s) / UC  Diagnoses   Final diagnoses:  Encounter for laboratory testing for COVID-19 virus     Discharge Instructions      Will notify you of any  positive findings. You may monitor your results on your MyChart online as well.       ED Prescriptions    None     PDMP not reviewed this encounter.   Zigmund Gottron, NP 07/07/19 1544

## 2019-07-07 NOTE — Discharge Instructions (Signed)
Will notify you of any positive findings. You may monitor your results on your MyChart online as well.    °

## 2019-07-08 LAB — NOVEL CORONAVIRUS, NAA (HOSP ORDER, SEND-OUT TO REF LAB; TAT 18-24 HRS): SARS-CoV-2, NAA: NOT DETECTED

## 2019-07-09 ENCOUNTER — Encounter (HOSPITAL_COMMUNITY): Payer: Self-pay

## 2019-09-13 ENCOUNTER — Encounter (HOSPITAL_COMMUNITY): Payer: Self-pay | Admitting: Emergency Medicine

## 2019-09-13 ENCOUNTER — Other Ambulatory Visit: Payer: Self-pay

## 2019-09-13 ENCOUNTER — Ambulatory Visit (HOSPITAL_COMMUNITY)
Admission: EM | Admit: 2019-09-13 | Discharge: 2019-09-13 | Disposition: A | Discharge status: Home / Self Care | Payer: Self-pay | Attending: Family Medicine | Admitting: Family Medicine

## 2019-09-13 ENCOUNTER — Ambulatory Visit: Payer: Self-pay | Attending: Nurse Practitioner | Admitting: Physician Assistant

## 2019-09-13 DIAGNOSIS — M654 Radial styloid tenosynovitis [de Quervain]: Secondary | ICD-10-CM

## 2019-09-13 DIAGNOSIS — M79642 Pain in left hand: Secondary | ICD-10-CM

## 2019-09-13 DIAGNOSIS — G5602 Carpal tunnel syndrome, left upper limb: Secondary | ICD-10-CM

## 2019-09-13 MED ORDER — MELOXICAM 15 MG PO TABS: 15.0000 mg | ORAL_TABLET | Freq: Every day | ORAL | 0 refills | Status: DC

## 2019-09-13 MED FILL — MELOXICAM 15 MG TABLET: 15 | 30 days supply | Qty: 30 | Fill #0

## 2019-09-13 NOTE — ED Triage Notes (Signed)
Pt c/o left hand pain onset 2 days associated w/swelling  Denies inj/trauma  A&O x4... NAD.Marland Kitchen. ambulatory

## 2019-09-13 NOTE — ED Provider Notes (Signed)
April Hudson   [id number] 09/13/19 Arrival Time: [id number]  ASSESSMENT & PLAN:  1. De Quervain's tenosynovitis, left     No indication for wrist imaging at this time. See AVS for d/c information.  Begin trial of: Meds ordered this encounter  Medications  . meloxicam (MOBIC) 15 MG tablet    Sig: Take 1 tablet (15 mg total) by mouth daily.    Dispense:  20 tablet    Refill:  0    Orders Placed This Encounter  Procedures  . Thumb spica  To wear for the next 5-7 days.  Recommend: Follow-up Information    [facility].   Why: If worsening or failing to improve as anticipated. Contact information: [address] [place] [id number]           Reviewed expectations re: course of current medical issues. Questions answered. Outlined signs and symptoms indicating need for more acute intervention. Patient verbalized understanding. After Visit Summary given.  SUBJECTIVE: History from: patient. April Hudson is a 48 y.o. female who reports fairly persistent moderate pain of her left radial wrist; described as aching and throbbing; without radiation. Onset: gradual. First noted: over the past week; worse over the past few days. Injury/trama: no. No specific overuse reported either. Symptoms have waxed and waned but are worse overall since beginning. Aggravating factors: certain movements and grasping. Alleviating factors: have not been identified. Associated symptoms: none reported. Extremity sensation changes or weakness: none. Self treatment: acetaminophen, with no relief.  History of similar: no.  Past Surgical History:  Procedure Laterality Date  . CARPAL TUNNEL RELEASE Right 01/13/2017   Procedure: CARPAL TUNNEL RELEASE;  Surgeon: Iran Planas, MD;  Location: [facility];  Service: Orthopedics;  Laterality: Right;  . COLONOSCOPY  last one 01/ 03/ 2018   sigmoid scope  . GANGLION  CYST EXCISION Right 01/13/2017   Procedure: REMOVAL GANGLION OF WRIST;  Surgeon: Iran Planas, MD;  Location: [facility];  Service: Orthopedics;  Laterality: Right;  . KNEE ARTHROSCOPY Left ~ 2015  . VAGINAL HYSTERECTOMY  2006     ROS: As per HPI. All other systems negative.    OBJECTIVE:  Vitals:   09/13/19 1510  BP: 111/80  Pulse: 93  Resp: 18  Temp: 98.3 F (36.8 C)  TempSrc: Oral  SpO2: 96%    General appearance: alert; no distress HEENT: [place]; AT Neck: supple with FROM Resp: unlabored respirations Extremities: . LUE: warm with well perfused appearance; pain reported over radial side of wrist, more notable with thumb and wrist movement; no erythema or inflammation; no swelling; FROM; very tender over radial styloid CV: brisk extremity capillary refill of RUE; 2+ radial pulse of RUE. Skin: warm and dry; no visible rashes Neurologic: gait normal; normal reflexes of RUE; normal sensation of RUE; normal strength of RUE Psychological: alert and cooperative; normal mood and affect    Allergies  Allergen Reactions  . Sulfa Antibiotics Hives and Rash    Past Medical History:  Diagnosis Date  . Carpal tunnel syndrome of right wrist   . Ganglion cyst of wrist, right   . History of adenomatous polyp of colon    tubular adenoma's 09/ 2016;  06/ 2017  . History of Clostridium difficile colitis 05/21/2016  . History of frequent urinary tract infections   . History of gastric ulcer    clinical diagnosis, no prior EGD  . History of uterine fibroid   .  Migraines   . OA (osteoarthritis)    Social History   Socioeconomic History  . Marital status: Significant Other    Spouse name: Not on file  . Number of children: 2  . Years of education: Not on file  . Highest education level: GED or equivalent  Occupational History  . Occupation: unemployed    Comment: Agricultural engineer  Tobacco Use  . Smoking status: Current Every Day Smoker    Packs/day: 1.00    Years:  29.00    Pack years: 29.00    Types: Cigarettes  . Smokeless tobacco: Never Used  Substance and Sexual Activity  . Alcohol use: No    Alcohol/week: 0.0 standard drinks  . Drug use: No  . Sexual activity: Yes    Birth control/protection: Surgical  Other Topics Concern  . Not on file  Social History Narrative   Lives with husband in a one story home.     Has 6 children (2 biological).     Works at [employer]..  Education: GED   Social Determinants of Health   Financial Resource Strain:   . Difficulty of Paying Living Expenses: Not on file  Food Insecurity:   . Worried About Charity fundraiser in the Last Year: Not on file  . Ran Out of Food in the Last Year: Not on file  Transportation Needs:   . Lack of Transportation (Medical): Not on file  . Lack of Transportation (Non-Medical): Not on file  Physical Activity:   . Days of Exercise per Week: Not on file  . Minutes of Exercise per Session: Not on file  Stress:   . Feeling of Stress : Not on file  Social Connections:   . Frequency of Communication with Friends and Family: Not on file  . Frequency of Social Gatherings with Friends and Family: Not on file  . Attends Religious Services: Not on file  . Active Member of Clubs or Organizations: Not on file  . Attends Archivist Meetings: Not on file  . Marital Status: Not on file   Family History  Problem Relation Age of Onset  . Diabetes Mother   . Hypertension Mother   . Ovarian cancer Mother   . Stomach cancer Maternal Uncle   . Heart disease Sister   . Healthy Brother   . Asthma Son   . Asthma Daughter   . Colon cancer Maternal Uncle 62  . Colon polyps Neg Hx   . Esophageal cancer Neg Hx   . Rectal cancer Neg Hx    Past Surgical History:  Procedure Laterality Date  . CARPAL TUNNEL RELEASE Right 01/13/2017   Procedure: CARPAL TUNNEL RELEASE;  Surgeon: Iran Planas, MD;  Location: [facility];  Service: Orthopedics;  Laterality: Right;  .  COLONOSCOPY  last one 01/ 03/ 2018   sigmoid scope  . GANGLION CYST EXCISION Right 01/13/2017   Procedure: REMOVAL GANGLION OF WRIST;  Surgeon: Iran Planas, MD;  Location: [facility];  Service: Orthopedics;  Laterality: Right;  . KNEE ARTHROSCOPY Left ~ 2015  . VAGINAL HYSTERECTOMY  2006      Vanessa Kick, MD 09/13/19 [phone]

## 2019-09-13 NOTE — Progress Notes (Signed)
Patient ID: April Hudson, female   DOB: 09-16-1971, 48 y.o.   MRN: [id number] Virtual Visit via Telephone Note  I connected with April Hudson on 09/13/19 at  2:30 PM EST by telephone and verified that I am speaking with the correct person using two identifiers.   I discussed the limitations, risks, security and privacy concerns of performing an evaluation and management service by telephone and the availability of in person appointments. I also discussed with the patient that there may be a patient responsible charge related to this service. The patient expressed understanding and agreed to proceed.  Patient location:  home My Location:  [place] office Persons on the call:  Me and the patient   History of Present Illness:  L hand bothering her for 2-3 weeks. No fever.  NKI.  She had a similar problem in her R hand previously and was diagnosed with CTS.  L hand does have paresthesias at night.  Decreased grip and strength of L hand.  She is R hand dominant.  No redness of any joint.      Observations/Objective:  NAD.  A&Ox3   Assessment and Plan: 1. Carpal tunnel syndrome of left wrist -obtain L wrist splint amd wear 24/7 X 1 week then every night.   - meloxicam (MOBIC) 15 MG tablet; Take 1 tablet (15 mg total) by mouth daily.  Dispense: 30 tablet; Refill: 0 - Ambulatory referral to Hand Surgery  2. Left hand pain - meloxicam (MOBIC) 15 MG tablet; Take 1 tablet (15 mg total) by mouth daily.  Dispense: 30 tablet; Refill: 0 - Ambulatory referral to Hand Surgery    Follow Up Instructions: See PCP and next scheduled interval   I discussed the assessment and treatment plan with the patient. The patient was provided an opportunity to ask questions and all were answered. The patient agreed with the plan and demonstrated an understanding of the instructions.   The patient was advised to call back or seek an in-person evaluation if the symptoms worsen or if the condition fails to improve  as anticipated.  I provided 9 minutes of non-face-to-face time during this encounter.   Sloan Caldron, PA-C

## 2019-10-04 ENCOUNTER — Ambulatory Visit: Payer: Self-pay | Attending: Internal Medicine

## 2019-10-04 DIAGNOSIS — Z20822 Contact with and (suspected) exposure to covid-19: Secondary | ICD-10-CM

## 2019-10-04 DIAGNOSIS — U071 COVID-19: Secondary | ICD-10-CM | POA: Insufficient documentation

## 2019-10-06 LAB — NOVEL CORONAVIRUS, NAA: SARS-CoV-2, NAA: DETECTED — AB

## 2019-11-27 ENCOUNTER — Encounter (HOSPITAL_COMMUNITY): Payer: Self-pay

## 2019-11-27 ENCOUNTER — Other Ambulatory Visit: Payer: Self-pay

## 2019-11-27 ENCOUNTER — Ambulatory Visit (HOSPITAL_COMMUNITY)
Admission: EM | Admit: 2019-11-27 | Discharge: 2019-11-27 | Disposition: A | Discharge status: Home / Self Care | Payer: 59 | Attending: Physician Assistant | Admitting: Physician Assistant

## 2019-11-27 DIAGNOSIS — J111 Influenza due to unidentified influenza virus with other respiratory manifestations: Secondary | ICD-10-CM

## 2019-11-27 DIAGNOSIS — G43909 Migraine, unspecified, not intractable, without status migrainosus: Secondary | ICD-10-CM

## 2019-11-27 DIAGNOSIS — G43809 Other migraine, not intractable, without status migrainosus: Secondary | ICD-10-CM

## 2019-11-27 DIAGNOSIS — R05 Cough: Secondary | ICD-10-CM

## 2019-11-27 LAB — POCT URINALYSIS DIP (DEVICE)
Bilirubin Urine: NEGATIVE
Glucose, UA: NEGATIVE mg/dL
Ketones, ur: NEGATIVE mg/dL
Leukocytes,Ua: NEGATIVE
Nitrite: NEGATIVE
Protein, ur: 30 mg/dL — AB
Specific Gravity, Urine: >=1.03 (ref 1.005–1.030)
Urobilinogen, UA: 0.2 mg/dL (ref 0.0–1.0)
pH: 5 (ref 5.0–8.0)

## 2019-11-27 LAB — POC INFLUENZA A AND B ANTIGEN (URGENT CARE ONLY)
Influenza A Ag: NEGATIVE
Influenza B Ag: NEGATIVE

## 2019-11-27 LAB — INFLUENZA A AND B ANTIGEN (CONVERTED LAB)
Influenza A Ag: NEGATIVE
Influenza B Ag: NEGATIVE

## 2019-11-27 MED ORDER — KETOROLAC TROMETHAMINE 60 MG/2ML IM SOLN
INTRAMUSCULAR | Status: AC
  Filled 2019-11-27: qty 2

## 2019-11-27 MED ORDER — ONDANSETRON 4 MG PO TBDP
4.0000 mg | ORAL_TABLET | Freq: Once | ORAL | Status: AC
  Administered 2019-11-27: 10:00:00 4 mg via ORAL

## 2019-11-27 MED ORDER — SUMATRIPTAN SUCCINATE 6 MG/0.5ML ~~LOC~~ SOLN
6.0000 mg | Freq: Once | SUBCUTANEOUS | Status: AC
  Administered 2019-11-27: 10:00:00 6 mg via SUBCUTANEOUS

## 2019-11-27 MED ORDER — ONDANSETRON 4 MG PO TBDP
ORAL_TABLET | ORAL | Status: AC
  Filled 2019-11-27: qty 1

## 2019-11-27 MED ORDER — ONDANSETRON HCL 4 MG PO TABS: 4.0000 mg | ORAL_TABLET | Freq: Four times a day (QID) | ORAL | 0 refills | Status: DC

## 2019-11-27 MED ORDER — KETOROLAC TROMETHAMINE 60 MG/2ML IM SOLN
60.0000 mg | Freq: Once | INTRAMUSCULAR | Status: AC
  Administered 2019-11-27: 10:00:00 60 mg via INTRAMUSCULAR

## 2019-11-27 MED ORDER — SUMATRIPTAN SUCCINATE 6 MG/0.5ML ~~LOC~~ SOLN
SUBCUTANEOUS | Status: AC
  Filled 2019-11-27: qty 0.5

## 2019-11-27 MED ORDER — BENZONATATE 100 MG PO CAPS: 100.0000 mg | ORAL_CAPSULE | Freq: Three times a day (TID) | ORAL | 0 refills | Status: DC

## 2019-11-27 NOTE — ED Triage Notes (Signed)
Pt is here with abdominal pain, headache, cough that started Sunday, she previously tested POSITIVE for COVID on 10/06/2019.

## 2019-11-27 NOTE — ED Provider Notes (Addendum)
[place]    CSN: [id number] Arrival date & time: 11/27/19  0801      History   Chief Complaint Chief Complaint  Patient presents with  . Cough    [name] is a 49 y.o. female.   Patient presents this morning to urgent care for a 2-day history of severe headache, cough productive with green sputum, chills, abdominal pain and nausea.  She also endorses some shortness of breath at times.  The shortness of breath is worse with exertion.  The cough is not always productive with green sputum and she remarks its every other cough.  She reports chest tightness with cough but denies chest pain.  Her abdominal pain is in her lower abdomen.  She has had a decrease in appetite and becomes nauseous and feeling like she may get sick whenever she tries to eat or drink.  Her headache is frontal and severe.  Light makes her head hurt worse.  She also reports diarrhea starting 2 days ago.  Denies blood in her stool.  Of note she was Covid positive on 10/04/2019.  She reports recovering for the most part following that infection.  She denies painful urination, frequency or urgency.  He has a history of migraine headache that appears to have been controlled with Topamax, however she recently ran out of this medication.     Past Medical History:  Diagnosis Date  . Carpal tunnel syndrome of right wrist   . Ganglion cyst of wrist, right   . History of adenomatous polyp of colon    tubular adenoma's 09/ 2016;  06/ 2017  . History of Clostridium difficile colitis 05/21/2016  . History of frequent urinary tract infections   . History of gastric ulcer    clinical diagnosis, no prior EGD  . History of uterine fibroid   . Migraines   . OA (osteoarthritis)     Patient Active Problem List   Diagnosis Date Noted  . Influenza A 10/23/2017  . Ganglion cyst of wrist, right 09/16/2016  . Esophageal reflux 05/24/2016  . Anal fissure 10/23/2015  . Epidermoid cyst 09/02/2015  .  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 Tinea pedis 05/26/2015  . Pain of left thumb 05/26/2015  . 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 De Quervain's tenosynovitis, left 04/14/2015  . Neck pain on right side 01/13/2015  . Carpal tunnel syndrome 12/30/2014  . Smoker 07/03/2014  . Family history of stomach cancer 07/03/2014    Past Surgical History:  Procedure Laterality Date  . CARPAL TUNNEL RELEASE Right 01/13/2017   Procedure: CARPAL TUNNEL RELEASE;  Surgeon: Iran Planas, MD;  Location: [facility];  Service: Orthopedics;  Laterality: Right;  . COLONOSCOPY  last one 01/ 03/ 2018   sigmoid scope  . GANGLION CYST EXCISION Right 01/13/2017   Procedure: REMOVAL GANGLION OF WRIST;  Surgeon: Iran Planas, MD;  Location: [facility];  Service: Orthopedics;  Laterality: Right;  . KNEE ARTHROSCOPY Left ~ 2015  . VAGINAL HYSTERECTOMY  2006    OB History   No obstetric history on file.      Home Medications    Prior to Admission medications   Medication Sig Start Date End Date Taking? Authorizing Provider  benzonatate (TESSALON) 100 MG capsule Take 1 capsule (100 mg total) by mouth every 8 (eight) hours. 11/27/19   Ellis Mehaffey, Marguerita Beards, PA-C  cyclobenzaprine (FLEXERIL) 5 MG tablet Take 1 tablet (5 mg total) by mouth 3 (  three) times daily as needed for muscle spasms. 05/28/19   [name], NP  fluticasone (FLONASE) 50 MCG/ACT nasal spray Place 1 spray into both nostrils daily. Patient not taking: Reported on 05/28/2019 12/23/18   Petrucelli, Glynda Jaeger, PA-C  linaclotide (LINZESS) 72 MCG capsule Take 1 capsule (72 mcg total) by mouth daily. 03/21/19   Milus Banister, MD  meloxicam (MOBIC) 15 MG tablet Take 1 tablet (15 mg total) by mouth daily. 09/13/19   Vanessa Kick, MD  ondansetron (ZOFRAN) 4 MG tablet Take 1 tablet (4 mg total) by mouth every 6 (six) hours. 11/27/19   Wilfred Dayrit, Marguerita Beards, PA-C  topiramate (TOPAMAX) 50 MG tablet Take 50 mg by  mouth 2 (two) times daily.    [provider]    Family History Family History  Problem Relation Age of Onset  . Diabetes Mother   . Hypertension Mother   . Ovarian cancer Mother   . Stomach cancer Maternal Uncle   . Heart disease Sister   . Healthy Brother   . Asthma Son   . Asthma Daughter   . Colon cancer Maternal Uncle 23  . Colon polyps Neg Hx   . Esophageal cancer Neg Hx   . Rectal cancer Neg Hx     Social History Social History   Tobacco Use  . Smoking status: Current Every Day Smoker    Packs/day: 1.00    Years: 29.00    Pack years: 29.00    Types: Cigarettes  . Smokeless tobacco: Never Used  Substance Use Topics  . Alcohol use: No    Alcohol/week: 0.0 standard drinks  . Drug use: No     Allergies   Sulfa antibiotics   Review of Systems Review of Systems  Constitutional: Positive for chills. Negative for fever.  HENT: Positive for congestion. Negative for ear pain, postnasal drip, sinus pain and sore throat.   Eyes: Positive for photophobia. Negative for pain and visual disturbance.  Respiratory: Positive for cough and shortness of breath. Negative for chest tightness.   Cardiovascular: Negative for chest pain and palpitations.  Gastrointestinal: Positive for abdominal pain, diarrhea and nausea. Negative for vomiting.  Genitourinary: Negative for dysuria, frequency, hematuria and urgency.  Musculoskeletal: Positive for myalgias. Negative for arthralgias and back pain.  Skin: Negative for color change and rash.  Neurological: Positive for headaches. Negative for seizures and syncope.  All other systems reviewed and are negative.    Physical Exam Triage Vital Signs ED Triage Vitals  Enc Vitals Group     BP      Pulse      Resp      Temp      Temp src      SpO2      Weight      Height      Head Circumference      Peak Flow      Pain Score      Pain Loc      Pain Edu?      Excl. in [place]?    No data found.  Updated Vital  Signs BP 108/78 (BP Location: Left Arm)   Pulse 90   Temp 98 F (36.7 C) (Oral)   Resp 17   Wt 142 lb 9.6 oz (64.7 kg)   SpO2 96%   BMI 26.94 kg/m   Visual Acuity Right Eye Distance:   Left Eye Distance:   Bilateral Distance:    Right Eye Near:   Left Eye  Near:    Bilateral Near:     Physical Exam Vitals and nursing note reviewed.  Constitutional:      General: She is not in acute distress.    Appearance: She is well-developed.  HENT:     Head: Normocephalic and atraumatic.     Right Ear: Tympanic membrane normal.     Left Ear: Tympanic membrane normal.     Nose:     Comments: Turbinates with erythema.    Mouth/Throat:     Mouth: Mucous membranes are moist.     Pharynx: Oropharynx is clear.  Eyes:     General: No scleral icterus.    Extraocular Movements: Extraocular movements intact.     Conjunctiva/sclera: Conjunctivae normal.     Pupils: Pupils are equal, round, and reactive to light.  Cardiovascular:     Rate and Rhythm: Normal rate and regular rhythm.     Heart sounds: No murmur. No friction rub. No gallop.      Comments: Sinus rhythm with sinus pause on auscultation however ECG without evidence of pauses or arrhythmia. Pulmonary:     Effort: Pulmonary effort is normal. No respiratory distress.     Breath sounds: Normal breath sounds. No stridor. No wheezing, rhonchi or rales.     Comments: Good O2 saturation able to fully complete sentences and conversational. Abdominal:     Palpations: Abdomen is soft.     Tenderness: There is no abdominal tenderness.  Musculoskeletal:        General: Normal range of motion.     Cervical back: Neck supple.     Right lower leg: No edema.     Left lower leg: No edema.  Skin:    General: Skin is warm and dry.  Neurological:     General: No focal deficit present.     Mental Status: She is alert and oriented to person, place, and time.  Psychiatric:        Mood and Affect: Mood normal.        Behavior: Behavior normal.         Thought Content: Thought content normal.        Judgment: Judgment normal.      UC Treatments / Results  Labs (all labs ordered are listed, but only abnormal results are displayed) Labs Reviewed  POCT URINALYSIS DIP (DEVICE) - Abnormal; Notable for the following components:      Result Value   Hgb urine dipstick LARGE (*)    Protein, ur 30 (*)    All other components within normal limits  INFLUENZA A AND B ANTIGEN (CONVERTED LAB)  INFLUENZA PANEL BY PCR (TYPE A & B)    EKG Normal sinus rhythm no major changes when compared to previous.  Radiology No results found.  Procedures Procedures (including critical care time)  Medications Ordered in UC Medications  ketorolac (TORADOL) injection 60 mg (60 mg Intramuscular Given 11/27/19 0943)  SUMAtriptan (IMITREX) injection 6 mg (6 mg Subcutaneous Given 11/27/19 0944)  ondansetron (ZOFRAN-ODT) disintegrating tablet 4 mg (4 mg Oral Given 11/27/19 0942)    Initial Impression / Assessment and Plan / UC Course  I have reviewed the triage vital signs and the nursing notes.  Pertinent labs & imaging results that were available during my care of the patient were reviewed by me and considered in my medical decision making (see chart for details).     #Influenza-like illness Patient is a 49 year old female presenting with influenza-like illness with severe headache.  She  was Covid positive on 10/04/2019.  She had negative rapid influenza for A and B today.  Given that she is saturating well and shortness of breath is primarily with cough episodes will defer imaging at this time.  We will treat symptomatically with close follow-up if worsening or fever development.  Migraine treated today with ketorolac and sumatriptan in office.  Recommended follow-up with primary care for further headache management. -Emergency department precautions and return precautions discussed.  Patient verbalized understanding of plan.  Final Clinical  Impressions(s) / UC Diagnoses   Final diagnoses:  Influenza-like illness     Discharge Instructions     Like for you to take the [facility] for your cough.  Please use Tylenol 2 regular strength tablets every 6 hours for headache and body aches.  Utilize the Zofran every 8 hours for nausea.  I would like for you to drink plenty of water and include Pedialyte or Gatorade.  Please eat small hearty meals.  If your diarrhea persists for more than 3 more days  at the frequency and consistency that you describe I would like for you return to clinic or follow-up with your primary care.  If your symptoms are not improving or worsening, such that you have fever, worsening shortness of breath, worsening nausea and vomiting In the next 3 days I would like for you to return to clinic for reevaluation.      ED Prescriptions    Medication Sig Dispense Auth. Provider   benzonatate (TESSALON) 100 MG capsule Take 1 capsule (100 mg total) by mouth every 8 (eight) hours. 21 capsule Sloane Palmer, Marguerita Beards, PA-C   ondansetron (ZOFRAN) 4 MG tablet Take 1 tablet (4 mg total) by mouth every 6 (six) hours. 12 tablet Savreen Gebhardt, Marguerita Beards, PA-C     PDMP not reviewed this encounter.   Purnell Shoemaker, PA-C 11/27/19 1003    Gray Maugeri, Marguerita Beards, PA-C 11/27/19 1058

## 2019-11-27 NOTE — Discharge Instructions (Addendum)
Like for you to take the [facility] for your cough.  Please use Tylenol 2 regular strength tablets every 6 hours for headache and body aches.  Utilize the Zofran every 8 hours for nausea.  I would like for you to drink plenty of water and include Pedialyte or Gatorade.  Please eat small hearty meals.  If your diarrhea persists for more than 3 more days  at the frequency and consistency that you describe I would like for you return to clinic or follow-up with your primary care.  If your symptoms are not improving or worsening, such that you have fever, worsening shortness of breath, worsening nausea and vomiting In the next 3 days I would like for you to return to clinic for reevaluation.

## 2019-11-27 NOTE — ED Notes (Signed)
Patient is unable to void at this time 

## 2019-11-28 MED FILL — BENZONATATE 100 MG CAPS: 100 | 7 days supply | Qty: 21 | Fill #0

## 2019-11-28 MED FILL — ONDANSETRON HCL 4 MG TABLET: 4 | 15 days supply | Qty: 12 | Fill #0

## 2019-12-02 ENCOUNTER — Emergency Department (HOSPITAL_COMMUNITY): Payer: 59

## 2019-12-02 ENCOUNTER — Encounter (HOSPITAL_COMMUNITY): Payer: Self-pay | Admitting: Emergency Medicine

## 2019-12-02 ENCOUNTER — Emergency Department (HOSPITAL_COMMUNITY)
Admission: EM | Admit: 2019-12-02 | Discharge: 2019-12-02 | Disposition: A | Discharge status: Home / Self Care | Payer: 59 | Attending: Emergency Medicine | Admitting: Emergency Medicine

## 2019-12-02 ENCOUNTER — Other Ambulatory Visit: Payer: Self-pay

## 2019-12-02 DIAGNOSIS — Z79899 Other long term (current) drug therapy: Secondary | ICD-10-CM | POA: Insufficient documentation

## 2019-12-02 DIAGNOSIS — F10230 Alcohol dependence with withdrawal, uncomplicated: Secondary | ICD-10-CM | POA: Diagnosis not present

## 2019-12-02 DIAGNOSIS — F17210 Nicotine dependence, cigarettes, uncomplicated: Secondary | ICD-10-CM | POA: Insufficient documentation

## 2019-12-02 DIAGNOSIS — R0789 Other chest pain: Secondary | ICD-10-CM | POA: Insufficient documentation

## 2019-12-02 DIAGNOSIS — R079 Chest pain, unspecified: Secondary | ICD-10-CM

## 2019-12-02 DIAGNOSIS — R251 Tremor, unspecified: Secondary | ICD-10-CM

## 2019-12-02 LAB — BASIC METABOLIC PANEL
Anion gap: 12 (ref 5–15)
BUN: <5 mg/dL — ABNORMAL LOW (ref 6–20)
CO2: 22 mmol/L (ref 22–32)
Calcium: 9.4 mg/dL (ref 8.9–10.3)
Chloride: 108 mmol/L (ref 98–111)
Creatinine, Ser: 0.66 mg/dL (ref 0.44–1.00)
GFR calc Af Amer: >60 mL/min (ref >60)
GFR calc non Af Amer: >60 mL/min (ref >60)
Glucose, Bld: 89 mg/dL (ref 70–99)
Potassium: 3.5 mmol/L (ref 3.5–5.1)
Sodium: 142 mmol/L (ref 135–145)

## 2019-12-02 LAB — CBC
HCT: 49.1 % — ABNORMAL HIGH (ref 36.0–46.0)
Hemoglobin: 16.9 g/dL — ABNORMAL HIGH (ref 12.0–15.0)
MCH: 33.2 pg (ref 26.0–34.0)
MCHC: 34.4 g/dL (ref 30.0–36.0)
MCV: 96.5 fL (ref 80.0–100.0)
Platelets: 198 10*3/uL (ref 150–400)
RBC: 5.09 MIL/uL (ref 3.87–5.11)
RDW: 13.2 % (ref 11.5–15.5)
WBC: 7.2 10*3/uL (ref 4.0–10.5)
nRBC: 0 % (ref 0.0–0.2)

## 2019-12-02 LAB — TROPONIN I (HIGH SENSITIVITY)
Troponin I (High Sensitivity): 3 ng/L (ref <18)
Troponin I (High Sensitivity): 3 ng/L (ref <18)

## 2019-12-02 LAB — I-STAT BETA HCG BLOOD, ED (MC, WL, AP ONLY): I-stat hCG, quantitative: <5 m[IU]/mL (ref <5)

## 2019-12-02 MED ORDER — CHLORDIAZEPOXIDE HCL 25 MG PO CAPS: ORAL_CAPSULE | ORAL | 0 refills | Status: DC

## 2019-12-02 MED ORDER — LORAZEPAM 2 MG/ML IJ SOLN
1.0000 mg | Freq: Once | INTRAMUSCULAR | Status: AC
  Administered 2019-12-02: 12:00:00 1 mg via INTRAVENOUS
  Filled 2019-12-02: qty 1

## 2019-12-02 MED ORDER — SODIUM CHLORIDE 0.9% FLUSH: 3.0000 mL | Freq: Once | INTRAVENOUS | Status: DC

## 2019-12-02 MED ORDER — SODIUM CHLORIDE 0.9 % IV BOLUS
500.0000 mL | Freq: Once | INTRAVENOUS | Status: AC
  Administered 2019-12-02: 500 mL via INTRAVENOUS

## 2019-12-02 NOTE — ED Triage Notes (Signed)
Pt reports chest pain that began 3 weeks ago, today she began having tremors to her R arm. She reports she typically drinks about 50oz beer every other day, has not drank since Thursday, went to UC today and they sent her here for further evaluation. She denies sob or other related symptoms. A/ox4, no neuro deficits noted.

## 2019-12-02 NOTE — Discharge Instructions (Signed)
You were seen in the emergency department for evaluation of chest pain and shaking of your arms.  You had blood work EKG and a chest x-ray that did not show any serious findings.  Your symptoms improved with medication.  The shaking may be related to your alcohol use.  Please schedule a follow-up appointment with your doctor and return to the emergency department if any worsening symptoms.

## 2019-12-02 NOTE — ED Provider Notes (Signed)
[facility] EMERGENCY DEPARTMENT Provider Note   CSN: [id number] Arrival date & time: 12/02/19  1128     History Chief Complaint  Patient presents with  . Chest Pain    April Hudson is a 49 y.o. female.  She is complaining of chest pain with been going on for 3 weeks intermittently.  Associated with a cough.  She said she went to urgent care and they did not find a reason for her pain. She also states she drinks every other day and has not drank since 2 days ago.  This morning she woke up with her left arm shaking.  Denies any numbness or weakness.  No bowel or bladder incontinence.  No visual or auditory hallucinations.  He uses tobacco, denies any street drugs.  No prior history of cardiac disease.  The history is provided by the patient.  Chest Pain Pain location:  L chest Pain quality: dull   Pain radiates to:  Does not radiate Pain severity:  Moderate Onset quality:  Gradual Timing:  Intermittent Progression:  Unchanged Chronicity:  New Worsened by:  Coughing Ineffective treatments:  None tried Associated symptoms: cough and shortness of breath   Associated symptoms: no abdominal pain, no altered mental status, no diaphoresis, no fever, no lower extremity edema, no nausea, no numbness, no vomiting and no weakness   Risk factors: smoking        Past Medical History:  Diagnosis Date  . Carpal tunnel syndrome of right wrist   . Ganglion cyst of wrist, right   . History of adenomatous polyp of colon    tubular adenoma's 09/ 2016;  06/ 2017  . History of Clostridium difficile colitis 05/21/2016  . History of frequent urinary tract infections   . History of gastric ulcer    clinical diagnosis, no prior EGD  . History of uterine fibroid   . Migraines   . OA (osteoarthritis)     Patient Active Problem List   Diagnosis Date Noted  . Influenza A 10/23/2017  . Ganglion cyst of wrist, right 09/16/2016  . Esophageal reflux 05/24/2016  . Anal  fissure 10/23/2015  . Epidermoid cyst 09/02/2015  . 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 Tinea pedis 05/26/2015  . Pain of left thumb 05/26/2015  . 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 De Quervain's tenosynovitis, left 04/14/2015  . Neck pain on right side 01/13/2015  . Carpal tunnel syndrome 12/30/2014  . Smoker 07/03/2014  . Family history of stomach cancer 07/03/2014    Past Surgical History:  Procedure Laterality Date  . CARPAL TUNNEL RELEASE Right 01/13/2017   Procedure: CARPAL TUNNEL RELEASE;  Surgeon: Iran Planas, MD;  Location: [facility];  Service: Orthopedics;  Laterality: Right;  . COLONOSCOPY  last one 01/ 03/ 2018   sigmoid scope  . GANGLION CYST EXCISION Right 01/13/2017   Procedure: REMOVAL GANGLION OF WRIST;  Surgeon: Iran Planas, MD;  Location: [facility];  Service: Orthopedics;  Laterality: Right;  . KNEE ARTHROSCOPY Left ~ 2015  . VAGINAL HYSTERECTOMY  2006     OB History   No obstetric history on file.     Family History  Problem Relation Age of Onset  . Diabetes Mother   . Hypertension Mother   . Ovarian cancer Mother   . Stomach cancer Maternal Uncle   . Heart disease Sister   . Healthy Brother   . Asthma Son   . Asthma Daughter   .  Colon cancer Maternal Uncle 80  . Colon polyps Neg Hx   . Esophageal cancer Neg Hx   . Rectal cancer Neg Hx     Social History   Tobacco Use  . Smoking status: Current Every Day Smoker    Packs/day: 1.00    Years: 29.00    Pack years: 29.00    Types: Cigarettes  . Smokeless tobacco: Never Used  Substance Use Topics  . Alcohol use: No    Alcohol/week: 0.0 standard drinks  . Drug use: No    Home Medications Prior to Admission medications   Medication Sig Start Date End Date Taking? Authorizing Provider  benzonatate (TESSALON) 100 MG capsule Take 1 capsule (100 mg total) by mouth every 8 (eight) hours. 11/27/19   Darr, Marguerita Beards, PA-C  cyclobenzaprine  (FLEXERIL) 5 MG tablet Take 1 tablet (5 mg total) by mouth 3 (three) times daily as needed for muscle spasms. 05/28/19   [name], NP  fluticasone (FLONASE) 50 MCG/ACT nasal spray Place 1 spray into both nostrils daily. Patient not taking: Reported on 05/28/2019 12/23/18   Petrucelli, Glynda Jaeger, PA-C  linaclotide (LINZESS) 72 MCG capsule Take 1 capsule (72 mcg total) by mouth daily. 03/21/19   Milus Banister, MD  meloxicam (MOBIC) 15 MG tablet Take 1 tablet (15 mg total) by mouth daily. 09/13/19   Vanessa Kick, MD  ondansetron (ZOFRAN) 4 MG tablet Take 1 tablet (4 mg total) by mouth every 6 (six) hours. 11/27/19   Darr, Marguerita Beards, PA-C  topiramate (TOPAMAX) 50 MG tablet Take 50 mg by mouth 2 (two) times daily.    [provider]    Allergies    Sulfa antibiotics  Review of Systems   Review of Systems  Constitutional: Negative for diaphoresis and fever.  HENT: Negative for sore throat.   Eyes: Negative for visual disturbance.  Respiratory: Positive for cough and shortness of breath.   Cardiovascular: Positive for chest pain.  Gastrointestinal: Negative for abdominal pain, nausea and vomiting.  Genitourinary: Negative for dysuria.  Musculoskeletal: Negative for neck pain.  Skin: Negative for rash.  Neurological: Positive for tremors. Negative for weakness and numbness.    Physical Exam Updated Vital Signs BP 118/88   Pulse (!) 104   Temp 98.3 F (36.8 C) (Oral)   Resp 15   SpO2 99%   Physical Exam Vitals and nursing note reviewed.  Constitutional:      General: She is not in acute distress.    Appearance: She is well-developed.  HENT:     Head: Normocephalic and atraumatic.  Eyes:     Conjunctiva/sclera: Conjunctivae normal.  Cardiovascular:     Rate and Rhythm: Normal rate and regular rhythm.     Heart sounds: Normal heart sounds. No murmur.  Pulmonary:     Effort: Pulmonary effort is normal. No respiratory distress.     Breath sounds: Normal breath  sounds.  Abdominal:     Palpations: Abdomen is soft.     Tenderness: There is no abdominal tenderness.  Musculoskeletal:        General: Normal range of motion.     Cervical back: Neck supple.     Right lower leg: No tenderness. No edema.     Left lower leg: No tenderness. No edema.  Skin:    General: Skin is warm and dry.     Capillary Refill: Capillary refill takes less than 2 seconds.  Neurological:     General: No focal deficit present.  Mental Status: She is alert.     Comments: She has intermittent tremor in her left and right arm.     ED Results / Procedures / Treatments   Labs (all labs ordered are listed, but only abnormal results are displayed) Labs Reviewed  BASIC METABOLIC PANEL - Abnormal; Notable for the following components:      Result Value   BUN <5 (*)    All other components within normal limits  CBC - Abnormal; Notable for the following components:   Hemoglobin 16.9 (*)    HCT 49.1 (*)    All other components within normal limits  I-STAT BETA HCG BLOOD, ED ([facility], [facility], [facility] ONLY)  TROPONIN I (HIGH SENSITIVITY)  TROPONIN I (HIGH SENSITIVITY)    EKG EKG Interpretation  Date/Time:  "Sunday December 02 2019 11:28:31 EST Ventricular Rate:  101 PR Interval:  144 QRS Duration: 72 QT Interval:  350 QTC Calculation: 453 R Axis:   67 Text Interpretation: Sinus tachycardia Nonspecific ST and T wave abnormality Abnormal ECG poor baseline secondary to tremor Confirmed by Arnold Kester ([id number]) on 12/02/2019 11:40:47 AM   Radiology DG Chest Port 1 View  Result Date: 12/02/2019 CLINICAL DATA:  Chest pain EXAM: PORTABLE CHEST 1 VIEW COMPARISON:  12/23/2018 FINDINGS: Cardiomediastinal contours and hilar structures are normal. Lungs are clear. No acute bone finding. IMPRESSION: No acute cardiopulmonary disease. Electronically Signed   By: Geoffrey  Wile M.D.   On: 12/02/2019 12:18    Procedures Procedures (including critical care time)  Medications Ordered in  ED Medications  sodium chloride flush (NS) 0.9 % injection 3 mL (has no administration in time range)  LORazepam (ATIVAN) injection 1 mg (1 mg Intravenous Given 12/02/19 1202)  sodium chloride 0.9 % bolus 500 mL (0 mLs Intravenous Stopped 12/02/19 1331)    ED Course  I have reviewed the triage vital signs and the nursing notes.  Pertinent labs & imaging results that were available during my care of the patient were reviewed by me and considered in my medical decision making (see chart for details).  Clinical Course as of Dec 01 1628  Sun Dec 02, 2019  1153 Differential includes ACS, pneumonia, pneumothorax, alcohol withdrawal, GERD, metabolic derangement, vascular   [MB]  1159 Chest x-ray interpreted by me as no pneumothorax normal mediastinum normal heart size.   [MB]  1233 Reevaluated patient, she is resting comfortably and tremors have stopped.  Tachycardia improving.   [MB]  1507 Patient's delta troponin remains unchanged.  She is interested in stopping drinking so we will prescribe her Librium taper.  We talked about not using alcohol while doing this.   [MB]    Clinical Course User Index [MB] Christopher Hink C, MD   MDM Rules/Calculators/A&P                       Final Clinical Impression(s) / ED Diagnoses Final diagnoses:  Nonspecific chest pain  Tremor  Alcohol dependence with uncomplicated withdrawal (HCC)    Rx / DC Orders ED Discharge Orders         Ordered    chlordiazePOXIDE (LIBRIUM) 25 MG capsule     02" /28/21 [place], Zondra Lawlor C, MD 12/02/19 [phone]

## 2019-12-05 ENCOUNTER — Ambulatory Visit: Payer: 59 | Attending: Nurse Practitioner | Admitting: Nurse Practitioner

## 2019-12-05 ENCOUNTER — Other Ambulatory Visit: Payer: Self-pay

## 2019-12-05 ENCOUNTER — Encounter: Payer: Self-pay | Admitting: Nurse Practitioner

## 2019-12-05 DIAGNOSIS — Z1231 Encounter for screening mammogram for malignant neoplasm of breast: Secondary | ICD-10-CM

## 2019-12-05 DIAGNOSIS — R1013 Epigastric pain: Secondary | ICD-10-CM

## 2019-12-05 DIAGNOSIS — Z131 Encounter for screening for diabetes mellitus: Secondary | ICD-10-CM

## 2019-12-05 DIAGNOSIS — D649 Anemia, unspecified: Secondary | ICD-10-CM

## 2019-12-05 DIAGNOSIS — F1010 Alcohol abuse, uncomplicated: Secondary | ICD-10-CM | POA: Diagnosis not present

## 2019-12-05 DIAGNOSIS — Z13220 Encounter for screening for lipoid disorders: Secondary | ICD-10-CM

## 2019-12-05 MED ORDER — OMEPRAZOLE 20 MG PO CPDR: 20.0000 mg | DELAYED_RELEASE_CAPSULE | Freq: Every day | ORAL | 3 refills | Status: DC

## 2019-12-05 MED FILL — OMEPRAZOLE 20 MG CAP: 20 | 30 days supply | Qty: 30 | Fill #0

## 2019-12-05 NOTE — Progress Notes (Signed)
Virtual Visit via Telephone Note Due to national recommendations of social distancing due to [place] 19, telehealth visit is felt to be most appropriate for this patient at this time.  I discussed the limitations, risks, security and privacy concerns of performing an evaluation and management service by telephone and the availability of in person appointments. I also discussed with the patient that there may be a patient responsible charge related to this service. The patient expressed understanding and agreed to proceed.    I connected with Josiah Lobo on 12/05/19  at   8:50 AM EST  EDT by telephone and verified that I am speaking with the correct person using two identifiers.   Consent I discussed the limitations, risks, security and privacy concerns of performing an evaluation and management service by telephone and the availability of in person appointments. I also discussed with the patient that there may be a patient responsible charge related to this service. The patient expressed understanding and agreed to proceed.   Location of Patient: Private Residence    Location of Provider: [place] and [place] participating in Telemedicine visit: Geryl Rankins FNP-BC [place]    History of Present Illness: Telemedicine visit for: Chest Pain   Seen in the ED On 12-02-2019 with c/o CP. Work up was unremarkable (CXR, Troponin). EKG showing ST. She was treated with librium for ETOH dependence. Today she endorses diarrhea which started yesterday and continues with epigastric chest pain. Denies shortness of breath, nausea or vomiting.  Will try PPI as she may have GERD. She does consume daily caffeine and is a smoker. Continues on librium. Interested in quitting alcohol use.    Past Medical History:  Diagnosis Date  . Carpal tunnel syndrome of right wrist   . Ganglion cyst of wrist, right   . History of adenomatous polyp of colon    tubular adenoma's 09/ 2016;  06/ 2017  . History of Clostridium difficile colitis 05/21/2016  . History of frequent urinary tract infections   . History of gastric ulcer    clinical diagnosis, no prior EGD  . History of uterine fibroid   . Migraines   . OA (osteoarthritis)     Past Surgical History:  Procedure Laterality Date  . CARPAL TUNNEL RELEASE Right 01/13/2017   Procedure: CARPAL TUNNEL RELEASE;  Surgeon: Iran Planas, MD;  Location: [facility];  Service: Orthopedics;  Laterality: Right;  . COLONOSCOPY  last one 01/ 03/ 2018   sigmoid scope  . GANGLION CYST EXCISION Right 01/13/2017   Procedure: REMOVAL GANGLION OF WRIST;  Surgeon: Iran Planas, MD;  Location: [facility] [facility];  Service: Orthopedics;  Laterality: Right;  . KNEE ARTHROSCOPY Left ~ 2015  . VAGINAL HYSTERECTOMY  2006    Family History  Problem Relation Age of Onset  . Diabetes Mother   . Hypertension Mother   . Ovarian cancer Mother   . Stomach cancer Maternal Uncle   . Heart disease Sister   . Healthy Brother   . Asthma Son   . Asthma Daughter   . Colon cancer Maternal Uncle 53  . Colon polyps Neg Hx   . Esophageal cancer Neg Hx   . Rectal cancer Neg Hx     Social History   Socioeconomic History  . Marital status: Married    Spouse name: Not on file  . Number of children: 2  . Years of education: Not on file  . Highest  education level: GED or equivalent  Occupational History  . Occupation: unemployed    Comment: Agricultural engineer  Tobacco Use  . Smoking status: Current Every Day Smoker    Packs/day: 1.00    Years: 29.00    Pack years: 29.00    Types: Cigarettes  . Smokeless tobacco: Never Used  Substance and Sexual Activity  . Alcohol use: No    Alcohol/week: 0.0 standard drinks  . Drug use: No  . Sexual activity: Yes    Birth control/protection: Surgical  Other Topics Concern  . Not on file  Social History Narrative   Lives with husband in a one story home.      Has 6 children (2 biological).     Works at [employer]..  Education: GED   Social Determinants of Health   Financial Resource Strain:   . Difficulty of Paying Living Expenses: Not on file  Food Insecurity:   . Worried About Charity fundraiser in the Last Year: Not on file  . Ran Out of Food in the Last Year: Not on file  Transportation Needs:   . Lack of Transportation (Medical): Not on file  . Lack of Transportation (Non-Medical): Not on file  Physical Activity:   . Days of Exercise per Week: Not on file  . Minutes of Exercise per Session: Not on file  Stress:   . Feeling of Stress : Not on file  Social Connections:   . Frequency of Communication with Friends and Family: Not on file  . Frequency of Social Gatherings with Friends and Family: Not on file  . Attends Religious Services: Not on file  . Active Member of Clubs or Organizations: Not on file  . Attends Archivist Meetings: Not on file  . Marital Status: Not on file     Observations/Objective: Awake, alert and oriented x 3   Review of Systems  Constitutional: Negative for fever, malaise/fatigue and weight loss.  HENT: Negative.  Negative for nosebleeds.   Eyes: Negative.  Negative for blurred vision, double vision and photophobia.  Respiratory: Negative.  Negative for cough and shortness of breath.   Cardiovascular: Positive for chest pain. Negative for palpitations and leg swelling.  Gastrointestinal: Positive for diarrhea. Negative for heartburn, nausea and vomiting.  Musculoskeletal: Negative.  Negative for myalgias.  Neurological: Negative.  Negative for dizziness, focal weakness, seizures and headaches.  Psychiatric/Behavioral: Positive for substance abuse. Negative for suicidal ideas.    Karstyn was seen today for chest pain.  Diagnoses and all orders for this visit:  Epigastric discomfort -     omeprazole (PRILOSEC) 20 MG capsule; Take 1 capsule (20 mg total) by mouth daily. INSTRUCTIONS:  Avoid GERD Triggers: acidic, spicy or fried foods, caffeine, coffee, sodas,  alcohol and chocolate.    Encounter for screening for diabetes mellitus -     Hemoglobin A1c; Future  Anemia, unspecified type -     CBC; Future  Alcohol abuse -     CMP14+EGFR; Future  Breast cancer screening by mammogram -     MM 3D SCREEN BREAST BILATERAL; Future  Lipid screening -     Lipid panel; Future   Follow Up Instructions Return in about 2 weeks (around 12/19/2019) for chest pain/GERD?.     I discussed the assessment and treatment plan with the patient. The patient was provided an opportunity to ask questions and all were answered. The patient agreed with the plan and demonstrated an understanding of the instructions.   The  patient was advised to call back or seek an in-person evaluation if the symptoms worsen or if the condition fails to improve as anticipated.  I provided 18 minutes of non-face-to-face time during this encounter including median intraservice time, reviewing previous notes, labs, imaging, medications and explaining diagnosis and management.  [name], FNP-BC

## 2019-12-18 ENCOUNTER — Encounter: Payer: Self-pay | Admitting: Nurse Practitioner

## 2019-12-18 ENCOUNTER — Ambulatory Visit: Payer: 59 | Attending: Nurse Practitioner

## 2019-12-18 ENCOUNTER — Ambulatory Visit: Payer: 59 | Attending: Nurse Practitioner | Admitting: Nurse Practitioner

## 2019-12-18 ENCOUNTER — Other Ambulatory Visit: Payer: Self-pay

## 2019-12-18 VITALS — BP 99/66 | HR 84 | Temp 97.7°F | Ht 61.0 in | Wt 141.0 lb

## 2019-12-18 DIAGNOSIS — K5909 Other constipation: Secondary | ICD-10-CM

## 2019-12-18 DIAGNOSIS — R05 Cough: Secondary | ICD-10-CM

## 2019-12-18 DIAGNOSIS — R1013 Epigastric pain: Secondary | ICD-10-CM

## 2019-12-18 DIAGNOSIS — R058 Other specified cough: Secondary | ICD-10-CM

## 2019-12-18 DIAGNOSIS — Z13220 Encounter for screening for lipoid disorders: Secondary | ICD-10-CM

## 2019-12-18 DIAGNOSIS — D649 Anemia, unspecified: Secondary | ICD-10-CM

## 2019-12-18 DIAGNOSIS — F1010 Alcohol abuse, uncomplicated: Secondary | ICD-10-CM

## 2019-12-18 DIAGNOSIS — Z131 Encounter for screening for diabetes mellitus: Secondary | ICD-10-CM

## 2019-12-18 MED ORDER — PANTOPRAZOLE SODIUM 40 MG PO TBEC: 40.0000 mg | DELAYED_RELEASE_TABLET | Freq: Every day | ORAL | 0 refills | Status: DC

## 2019-12-18 MED ORDER — LINACLOTIDE 72 MCG PO CAPS: 72.0000 ug | ORAL_CAPSULE | Freq: Every day | ORAL | 5 refills | Status: DC

## 2019-12-18 MED ORDER — BENZONATATE 100 MG PO CAPS: 100.0000 mg | ORAL_CAPSULE | Freq: Three times a day (TID) | ORAL | 1 refills | Status: DC

## 2019-12-18 MED FILL — PANTOPRAZOLE SOD DR 40 MG T: 40 | 90 days supply | Qty: 90 | Fill #0

## 2019-12-18 MED FILL — BENZONATATE 100 MG CAPS: 100 | 14 days supply | Qty: 42 | Fill #0

## 2019-12-18 MED FILL — LINZESS 72 MCG CAPSULE: 72 | 30 days supply | Qty: 30 | Fill #0

## 2019-12-18 NOTE — Progress Notes (Signed)
Assessment & Plan:  Kersey was seen today for follow-up.  Diagnoses and all orders for this visit:  Epigastric pain -     Cancel: Ambulatory referral to Gastroenterology -     Ambulatory referral to Gastroenterology -     pantoprazole (PROTONIX) 40 MG tablet; Take 1 tablet (40 mg total) by mouth daily. INSTRUCTIONS: Avoid GERD Triggers: acidic, spicy or fried foods, caffeine, coffee, sodas,  alcohol and chocolate.   Chronic constipation -     linaclotide (LINZESS) 72 MCG capsule; Take 1 capsule (72 mcg total) by mouth daily.  Cough present for greater than 3 weeks -     benzonatate (TESSALON) 100 MG capsule; Take 1 capsule (100 mg total) by mouth every 8 (eight) hours.    Patient has been counseled on age-appropriate routine health concerns for screening and prevention. These are reviewed and up-to-date. Referrals have been placed accordingly. Immunizations are up-to-date or declined.    Subjective:   Chief Complaint  Patient presents with  . Follow-up    Pt. is still having epigastric comfort.    [name] 49 y.o. female presents to office today for follow up to epigastric pain.    Epigastric Pain Started on PPI several weeks ago for epigastric pain. Cardiac etiology ruled out. Minimal relief at this time. She does consume caffeine and continues to smoke tobacco. Needs to see GI. She also has history of ETOH overuse which could be contributing to her symptoms. Associated symptoms: cough.   20% better  Voice is raspy. 3 weeks ago.  Lots of phlegm; dark greenish and thick and goes away as the day goes on.      Review of Systems  Constitutional: Negative for fever, malaise/fatigue and weight loss.  HENT: Negative.  Negative for nosebleeds.   Eyes: Negative.  Negative for blurred vision, double vision and photophobia.  Respiratory: Positive for cough. Negative for shortness of breath.   Cardiovascular: Negative.  Negative for chest pain, palpitations and  leg swelling.  Gastrointestinal: Positive for constipation and heartburn. Negative for nausea and vomiting.  Musculoskeletal: Negative.  Negative for myalgias.  Neurological: Negative.  Negative for dizziness, focal weakness, seizures and headaches.  Psychiatric/Behavioral: Negative.  Negative for suicidal ideas.    Past Medical History:  Diagnosis Date  . Carpal tunnel syndrome of right wrist   . Ganglion cyst of wrist, right   . History of adenomatous polyp of colon    tubular adenoma's 09/ 2016;  06/ 2017  . History of Clostridium difficile colitis 05/21/2016  . History of frequent urinary tract infections   . History of gastric ulcer    clinical diagnosis, no prior EGD  . History of uterine fibroid   . Migraines   . OA (osteoarthritis)     Past Surgical History:  Procedure Laterality Date  . CARPAL TUNNEL RELEASE Right 01/13/2017   Procedure: CARPAL TUNNEL RELEASE;  Surgeon: Iran Planas, MD;  Location: [facility];  Service: Orthopedics;  Laterality: Right;  . COLONOSCOPY  last one 01/ 03/ 2018   sigmoid scope  . GANGLION CYST EXCISION Right 01/13/2017   Procedure: REMOVAL GANGLION OF WRIST;  Surgeon: Iran Planas, MD;  Location: [facility];  Service: Orthopedics;  Laterality: Right;  . KNEE ARTHROSCOPY Left ~ 2015  . VAGINAL HYSTERECTOMY  2006    Family History  Problem Relation Age of Onset  . Diabetes Mother   . Hypertension Mother   . Ovarian cancer Mother   .  Stomach cancer Maternal Uncle   . Heart disease Sister   . Healthy Brother   . Asthma Son   . Asthma Daughter   . Colon cancer Maternal Uncle 29  . Colon polyps Neg Hx   . Esophageal cancer Neg Hx   . Rectal cancer Neg Hx     Social History Reviewed with no changes to be made today.   Outpatient Medications Prior to Visit  Medication Sig Dispense Refill  . ondansetron (ZOFRAN) 4 MG tablet Take 1 tablet (4 mg total) by mouth every 6 (six) hours. 12 tablet 0  .  benzonatate (TESSALON) 100 MG capsule Take 1 capsule (100 mg total) by mouth every 8 (eight) hours. 21 capsule 0  . omeprazole (PRILOSEC) 20 MG capsule Take 1 capsule (20 mg total) by mouth daily. 30 capsule 3  . chlordiazePOXIDE (LIBRIUM) 25 MG capsule 50mg  PO TID x 1D, then 25-50mg  PO BID X 1D, then 25-50mg  PO QD X 1D (Patient not taking: Reported on 12/05/2019) 10 capsule 0  . cyclobenzaprine (FLEXERIL) 5 MG tablet Take 1 tablet (5 mg total) by mouth 3 (three) times daily as needed for muscle spasms. (Patient not taking: Reported on 12/02/2019) 60 tablet 1  . fluticasone (FLONASE) 50 MCG/ACT nasal spray Place 1 spray into both nostrils daily. (Patient not taking: Reported on 05/28/2019) 16 g 0  . linaclotide (LINZESS) 72 MCG capsule Take 1 capsule (72 mcg total) by mouth daily. (Patient not taking: Reported on 12/05/2019) 30 capsule 5  . meloxicam (MOBIC) 15 MG tablet Take 1 tablet (15 mg total) by mouth daily. (Patient not taking: Reported on 12/02/2019) 20 tablet 0   No facility-administered medications prior to visit.    Allergies  Allergen Reactions  . Sulfa Antibiotics Hives and Rash       Objective:    BP 99/66 (BP Location: Left Arm, Patient Position: Sitting, Cuff Size: Normal)   Pulse 84   Temp 97.7 F (36.5 C) (Temporal)   Ht 5\' 1"  (1.549 m)   Wt 141 lb (64 kg)   SpO2 99%   BMI 26.64 kg/m  Wt Readings from Last 3 Encounters:  12/18/19 141 lb (64 kg)  11/27/19 142 lb 9.6 oz (64.7 kg)  07/07/19 130 lb (59 kg)    Physical Exam Vitals and nursing note reviewed.  Constitutional:      Appearance: She is well-developed.  HENT:     Head: Normocephalic and atraumatic.  Cardiovascular:     Rate and Rhythm: Normal rate and regular rhythm.     Heart sounds: Normal heart sounds. No murmur. No friction rub. No gallop.   Pulmonary:     Effort: Pulmonary effort is normal. No tachypnea or respiratory distress.     Breath sounds: Normal breath sounds. No decreased breath sounds,  wheezing, rhonchi or rales.  Chest:     Chest wall: No tenderness.  Abdominal:     General: Bowel sounds are normal.     Palpations: Abdomen is soft.  Musculoskeletal:        General: Normal range of motion.     Cervical back: Normal range of motion.  Skin:    General: Skin is warm and dry.  Neurological:     Mental Status: She is alert and oriented to person, place, and time.     Coordination: Coordination normal.  Psychiatric:        Behavior: Behavior normal. Behavior is cooperative.        Thought Content: Thought content  normal.        Judgment: Judgment normal.          Patient has been counseled extensively about nutrition and exercise as well as the importance of adherence with medications and regular follow-up. The patient was given clear instructions to go to ER or return to medical center if symptoms don't improve, worsen or new problems develop. The patient verbalized understanding.   Follow-up: No follow-ups on file.   [name], FNP-BC [facility] and [facility] [place], [place]   12/20/2019, 9:08 AM

## 2019-12-18 NOTE — Patient Instructions (Signed)
Food Choices for Gastroesophageal Reflux Disease, Adult When you have gastroesophageal reflux disease (GERD), the foods you eat and your eating habits are very important. Choosing the right foods can help ease your discomfort. Think about working with a nutrition specialist (dietitian) to help you make good choices. What are tips for following this plan?  Meals  Choose healthy foods that are low in fat, such as fruits, vegetables, whole grains, low-fat dairy products, and lean meat, fish, and poultry.  Eat small meals often instead of 3 large meals a day. Eat your meals slowly, and in a place where you are relaxed. Avoid bending over or lying down until 2-3 hours after eating.  Avoid eating meals 2-3 hours before bed.  Avoid drinking a lot of liquid with meals.  Cook foods using methods other than frying. Bake, grill, or broil food instead.  Avoid or limit: ? Chocolate. ? Peppermint or spearmint. ? Alcohol. ? Pepper. ? Black and decaffeinated coffee. ? Black and decaffeinated tea. ? Bubbly (carbonated) soft drinks. ? Caffeinated energy drinks and soft drinks.  Limit high-fat foods such as: ? Fatty meat or fried foods. ? Whole milk, cream, butter, or ice cream. ? Nuts and nut butters. ? Pastries, donuts, and sweets made with butter or shortening.  Avoid foods that cause symptoms. These foods may be different for everyone. Common foods that cause symptoms include: ? Tomatoes. ? Oranges, lemons, and limes. ? Peppers. ? Spicy food. ? Onions and garlic. ? Vinegar. Lifestyle  Maintain a healthy weight. Ask your doctor what weight is healthy for you. If you need to lose weight, work with your doctor to do so safely.  Exercise for at least 30 minutes for 5 or more days each week, or as told by your doctor.  Wear loose-fitting clothes.  Do not smoke. If you need help quitting, ask your doctor.  Sleep with the head of your bed higher than your feet. Use a wedge under the  mattress or blocks under the bed frame to raise the head of the bed. Summary  When you have gastroesophageal reflux disease (GERD), food and lifestyle choices are very important in easing your symptoms.  Eat small meals often instead of 3 large meals a day. Eat your meals slowly, and in a place where you are relaxed.  Limit high-fat foods such as fatty meat or fried foods.  Avoid bending over or lying down until 2-3 hours after eating.  Avoid peppermint and spearmint, caffeine, alcohol, and chocolate. This information is not intended to replace advice given to you by your health care provider. Make sure you discuss any questions you have with your health care provider. Document Revised: 01/11/2019 Document Reviewed: 10/26/2016 Elsevier Patient Education  [place].  Heartburn Heartburn is a type of pain or discomfort that can happen in the throat or chest. It is often described as a burning pain. It may also cause a bad, acid-like taste in the mouth. Heartburn may feel worse when you lie down or bend over. It may be worse at night. It may be caused by stomach contents that move back up (reflux) into the tube that connects the mouth with the stomach (esophagus). Follow these instructions at home: Eating and drinking   Avoid certain foods and drinks as told by your doctor. This may include: ? Coffee and tea (with or without caffeine). ? Drinks that have alcohol. ? Energy drinks and sports drinks. ? Carbonated drinks or sodas. ? Chocolate and cocoa. ? Peppermint  and mint flavorings. ? Garlic and onions. ? Horseradish. ? Spicy and acidic foods, such as:  Peppers.  Chili powder and curry powder.  Vinegar.  Hot sauces and BBQ sauce. ? Citrus fruit juices and citrus fruits, such as:  Oranges.  Lemons.  Limes. ? Tomato-based foods, such as:  Red sauce and pizza with red sauce.  Chili.  Salsa. ? Fried and fatty foods, such as:  Donuts.  [place] fries and  potato chips.  High-fat dressings. ? High-fat meats, such as:  Hot dogs and sausage.  Rib eye steak.  Ham and bacon. ? High-fat dairy items, such as:  Whole milk.  Butter.  Cream cheese.  Eat small meals often. Avoid eating large meals.  Avoid drinking large amounts of liquid with your meals.  Avoid eating meals during the 2-3 hours before bedtime.  Avoid lying down right after you eat.  Do not exercise right after you eat. Lifestyle      If you are overweight, lose an amount of weight that is healthy for you. Ask your doctor about a safe weight loss goal.  Do not use any products that contain nicotine or tobacco, including cigarettes, e-cigarettes, and chewing tobacco. These can make your symptoms worse. If you need help quitting, ask your doctor.  Wear loose clothes. Do not wear anything tight around your waist.  Raise (elevate) the head of your bed about 6 inches (15 cm) when you sleep.  Try to lower your stress. If you need help doing this, ask your doctor. General instructions  Pay attention to any changes in your symptoms.  Take over-the-counter and prescription medicines only as told by your doctor. ? Do not take aspirin, ibuprofen, or other NSAIDs unless your doctor says it is okay. ? Stop medicines only as told by your doctor.  Keep all follow-up visits as told by your doctor. This is important. Contact a doctor if:  You have new symptoms.  You lose weight and you do not know why it is happening.  You have trouble swallowing, or it hurts to swallow.  You have wheezing or a cough that keeps happening.  Your symptoms do not get better with treatment.  You have heartburn often for more than 2 weeks. Get help right away if:  You have pain in your arms, neck, jaw, teeth, or back.  You feel sweaty, dizzy, or light-headed.  You have chest pain or shortness of breath.  You throw up (vomit) and your throw up looks like blood or coffee  grounds.  Your poop (stool) is bloody or black. These symptoms may represent a serious problem that is an emergency. Do not wait to see if the symptoms will go away. Get medical help right away. Call your local emergency services (911 in the [place] Do not drive yourself to the hospital. Summary  Heartburn is a type of pain that can happen in the throat or chest. It can feel like a burning pain. It may also cause a bad, acid-like taste in the mouth.  You may need to avoid certain foods and drinks to help your symptoms. Ask your doctor what foods and drinks you should avoid.  Take over-the-counter and prescription medicines only as told by your doctor. Do not take aspirin, ibuprofen, or other NSAIDs unless your doctor told you to do so.  Contact your doctor if your symptoms do not get better or they get worse. This information is not intended to replace advice given to you by your health  care provider. Make sure you discuss any questions you have with your health care provider. Document Revised: 02/20/2018 Document Reviewed: 02/20/2018 Elsevier Patient Education  [place].

## 2019-12-19 ENCOUNTER — Other Ambulatory Visit: Payer: Self-pay | Admitting: Nurse Practitioner

## 2019-12-19 DIAGNOSIS — Z1231 Encounter for screening mammogram for malignant neoplasm of breast: Secondary | ICD-10-CM

## 2019-12-19 LAB — CBC
Hematocrit: 45.8 % (ref 34.0–46.6)
Hemoglobin: 16.1 g/dL — ABNORMAL HIGH (ref 11.1–15.9)
MCH: 33.9 pg — ABNORMAL HIGH (ref 26.6–33.0)
MCHC: 35.2 g/dL (ref 31.5–35.7)
MCV: 96 fL (ref 79–97)
Platelets: 198 10*3/uL (ref 150–450)
RBC: 4.75 x10E6/uL (ref 3.77–5.28)
RDW: 12.5 % (ref 11.7–15.4)
WBC: 6.8 10*3/uL (ref 3.4–10.8)

## 2019-12-19 LAB — LIPID PANEL
Chol/HDL Ratio: 4.2 ratio (ref 0.0–4.4)
Cholesterol, Total: 204 mg/dL — ABNORMAL HIGH (ref 100–199)
HDL: 49 mg/dL (ref >39)
LDL Chol Calc (NIH): 138 mg/dL — ABNORMAL HIGH (ref 0–99)
Triglycerides: 95 mg/dL (ref 0–149)
VLDL Cholesterol Cal: 17 mg/dL (ref 5–40)

## 2019-12-19 LAB — CMP14+EGFR
ALT: 10 IU/L (ref 0–32)
AST: 18 IU/L (ref 0–40)
Albumin/Globulin Ratio: 1.6 (ref 1.2–2.2)
Albumin: 4.1 g/dL (ref 3.8–4.8)
Alkaline Phosphatase: 64 IU/L (ref 39–117)
BUN/Creatinine Ratio: 8 — ABNORMAL LOW (ref 9–23)
BUN: 7 mg/dL (ref 6–24)
Bilirubin Total: 0.3 mg/dL (ref 0.0–1.2)
CO2: 24 mmol/L (ref 20–29)
Calcium: 9.4 mg/dL (ref 8.7–10.2)
Chloride: 106 mmol/L (ref 96–106)
Creatinine, Ser: 0.83 mg/dL (ref 0.57–1.00)
GFR calc Af Amer: 95 mL/min/{1.73_m2} (ref >59)
GFR calc non Af Amer: 82 mL/min/{1.73_m2} (ref >59)
Globulin, Total: 2.6 g/dL (ref 1.5–4.5)
Glucose: 87 mg/dL (ref 65–99)
Potassium: 4.1 mmol/L (ref 3.5–5.2)
Sodium: 144 mmol/L (ref 134–144)
Total Protein: 6.7 g/dL (ref 6.0–8.5)

## 2019-12-19 LAB — HEMOGLOBIN A1C
Est. average glucose Bld gHb Est-mCnc: 114 mg/dL
Hgb A1c MFr Bld: 5.6 % (ref 4.8–5.6)

## 2019-12-20 ENCOUNTER — Encounter: Payer: Self-pay | Admitting: Nurse Practitioner

## 2019-12-21 ENCOUNTER — Ambulatory Visit: Payer: 59 | Admitting: Nurse Practitioner

## 2020-01-08 ENCOUNTER — Ambulatory Visit: Payer: 59 | Admitting: Nurse Practitioner

## 2020-01-11 ENCOUNTER — Other Ambulatory Visit: Payer: Self-pay

## 2020-01-11 ENCOUNTER — Ambulatory Visit
Admission: RE | Admit: 2020-01-11 | Discharge: 2020-01-11 | Disposition: A | Discharge status: Home / Self Care | Payer: 59 | Source: Ambulatory Visit | Attending: Nurse Practitioner | Admitting: Nurse Practitioner

## 2020-01-11 DIAGNOSIS — Z1231 Encounter for screening mammogram for malignant neoplasm of breast: Secondary | ICD-10-CM

## 2020-01-13 ENCOUNTER — Other Ambulatory Visit: Payer: Self-pay | Admitting: Nurse Practitioner

## 2020-01-13 DIAGNOSIS — R928 Other abnormal and inconclusive findings on diagnostic imaging of breast: Secondary | ICD-10-CM

## 2020-01-24 ENCOUNTER — Other Ambulatory Visit: Payer: Self-pay

## 2020-01-24 ENCOUNTER — Ambulatory Visit (INDEPENDENT_AMBULATORY_CARE_PROVIDER_SITE_OTHER)
Admission: RE | Admit: 2020-01-24 | Discharge: 2020-01-24 | Disposition: A | Discharge status: Home / Self Care | Payer: 59 | Source: Ambulatory Visit | Attending: Physician Assistant | Admitting: Physician Assistant

## 2020-01-24 ENCOUNTER — Other Ambulatory Visit (INDEPENDENT_AMBULATORY_CARE_PROVIDER_SITE_OTHER): Payer: 59

## 2020-01-24 ENCOUNTER — Ambulatory Visit (INDEPENDENT_AMBULATORY_CARE_PROVIDER_SITE_OTHER): Payer: 59 | Admitting: Physician Assistant

## 2020-01-24 ENCOUNTER — Encounter: Payer: Self-pay | Admitting: Physician Assistant

## 2020-01-24 VITALS — BP 108/62 | HR 80 | Temp 98.5°F | Ht 61.0 in | Wt 138.4 lb

## 2020-01-24 DIAGNOSIS — R1013 Epigastric pain: Secondary | ICD-10-CM

## 2020-01-24 DIAGNOSIS — K219 Gastro-esophageal reflux disease without esophagitis: Secondary | ICD-10-CM

## 2020-01-24 DIAGNOSIS — Z01818 Encounter for other preprocedural examination: Secondary | ICD-10-CM

## 2020-01-24 DIAGNOSIS — R0789 Other chest pain: Secondary | ICD-10-CM

## 2020-01-24 DIAGNOSIS — R1310 Dysphagia, unspecified: Secondary | ICD-10-CM

## 2020-01-24 LAB — CBC WITH DIFFERENTIAL/PLATELET
Basophils Absolute: 0.1 10*3/uL (ref 0.0–0.1)
Basophils Relative: 1 % (ref 0.0–3.0)
Eosinophils Absolute: 0.1 10*3/uL (ref 0.0–0.7)
Eosinophils Relative: 2 % (ref 0.0–5.0)
HCT: 46.4 % — ABNORMAL HIGH (ref 36.0–46.0)
Hemoglobin: 15.9 g/dL — ABNORMAL HIGH (ref 12.0–15.0)
Lymphocytes Relative: 39.9 % (ref 12.0–46.0)
Lymphs Abs: 2.4 10*3/uL (ref 0.7–4.0)
MCHC: 34.2 g/dL (ref 30.0–36.0)
MCV: 98.7 fl (ref 78.0–100.0)
Monocytes Absolute: 0.5 10*3/uL (ref 0.1–1.0)
Monocytes Relative: 7.8 % (ref 3.0–12.0)
Neutro Abs: 3 10*3/uL (ref 1.4–7.7)
Neutrophils Relative %: 49.3 % (ref 43.0–77.0)
Platelets: 196 10*3/uL (ref 150.0–400.0)
RBC: 4.7 Mil/uL (ref 3.87–5.11)
RDW: 13.5 % (ref 11.5–15.5)
WBC: 6 10*3/uL (ref 4.0–10.5)

## 2020-01-24 LAB — SEDIMENTATION RATE: Sed Rate: 12 mm/hr (ref 0–30)

## 2020-01-24 LAB — HIGH SENSITIVITY CRP: CRP, High Sensitivity: 0.88 mg/L (ref 0.000–5.000)

## 2020-01-24 MED ORDER — PANTOPRAZOLE SODIUM 40 MG PO TBEC: 40.0000 mg | DELAYED_RELEASE_TABLET | Freq: Two times a day (BID) | ORAL | 1 refills | Status: DC

## 2020-01-24 NOTE — Progress Notes (Signed)
Subjective:    Patient ID: April Hudson, female    DOB: 06-04-1971, 49 y.o.   MRN: [id number]  [name] is a 49 year old African-American female, known to Dr. Ardis Hughs, who comes in today with complaints of epigastric pain. She has history of GERD, osteoarthritis, is status post hysterectomy, and had a large tubular adenoma removed in 2016.  She underwent repeat colonoscopy in June 2020 with finding of a 10 mm polyp at the cecum presumed to be at the same site as the prior 4 cm adenoma.  Also noted to have multiple diverticuli.  Polyp was removed and found to be a tubular adenoma without high-grade dysplasia and she is indicated for follow-up in 3 years. No prior EGD.  She did have labs in March 2021 with WBC of 6.8, hemoglobin 16 hematocrit of 45 LFTs within normal limits.  Patient has been on Protonix 40 mg p.o. every morning long-term for GERD symptoms.  She says her current pain has been present over the past 2 to 3 months, she describes pain in the lower chest and epigastrium describes it as sharp in nature.  She has not noticed any change in symptoms with eating or swallowing.  No complaints of odynophagia but has had some intermittent dysphagia and has had a couple of episodes requiring regurgitation. Denies any nausea or vomiting.  Weight has fluctuated but has not trended down.  No fever or chills.  She has been having loose stools over the past couple of weeks somewhat dark but no melena or hematochezia. No regular aspirin or NSAID use.  She has not had any prior similar pain.  Review of Systems Pertinent positive and negative review of systems were noted in the above HPI section.  All other review of systems was otherwise negative.  Outpatient Encounter Medications as of 01/24/2020  Medication Sig   chlordiazePOXIDE (LIBRIUM) 25 MG capsule 50mg  PO TID x 1D, then 25-50mg  PO BID X 1D, then 25-50mg  PO QD X 1D   cyclobenzaprine (FLEXERIL) 5 MG tablet Take 1 tablet (5 mg total) by  mouth 3 (three) times daily as needed for muscle spasms.   linaclotide (LINZESS) 72 MCG capsule Take 1 capsule (72 mcg total) by mouth daily.   pantoprazole (PROTONIX) 40 MG tablet Take 1 tablet (40 mg total) by mouth 2 (two) times daily. Take before breakfast and dinner   [DISCONTINUED] pantoprazole (PROTONIX) 40 MG tablet Take 1 tablet (40 mg total) by mouth daily.   benzonatate (TESSALON) 100 MG capsule Take 1 capsule (100 mg total) by mouth every 8 (eight) hours. (Patient not taking: Reported on 01/24/2020)   fluticasone (FLONASE) 50 MCG/ACT nasal spray Place 1 spray into both nostrils daily. (Patient not taking: Reported on 05/28/2019)   ondansetron (ZOFRAN) 4 MG tablet Take 1 tablet (4 mg total) by mouth every 6 (six) hours. (Patient not taking: Reported on 01/24/2020)   No facility-administered encounter medications on file as of 01/24/2020.   Allergies  Allergen Reactions   Sulfa Antibiotics Hives and Rash   Patient Active Problem List   Diagnosis Date Noted   Influenza A 10/23/2017   Ganglion cyst of wrist, right 09/16/2016   Esophageal reflux 05/24/2016   Anal fissure 10/23/2015   Epidermoid cyst 09/02/2015   Rectal polyp,focal high grade dysplasia 06/27/2015   Tinea pedis 05/26/2015   Pain of left thumb 05/26/2015   Osteoarthritis of left wrist 04/29/2015   De Quervain's tenosynovitis, left 04/14/2015   Neck pain on right side 01/13/2015  Carpal tunnel syndrome 12/30/2014   Smoker 07/03/2014   Family history of stomach cancer 07/03/2014   Social History   Socioeconomic History   Marital status: Married    Spouse name: Not on file   Number of children: 2   Years of education: Not on file   Highest education level: GED or equivalent  Occupational History   Occupation: unemployed    Comment: Agricultural engineer  Tobacco Use   Smoking status: Current Every Day Smoker    Packs/day: 1.00    Years: 29.00    Pack years: 29.00    Types: Cigarettes    Smokeless tobacco: Never Used  Substance and Sexual Activity   Alcohol use: No    Alcohol/week: 0.0 standard drinks   Drug use: No   Sexual activity: Yes    Birth control/protection: Surgical  Other Topics Concern   Not on file  Social History Narrative   Lives with husband in a one story home.     Has 6 children (2 biological).     Works at [employer]..  Education: GED   Social Determinants of Health   Financial Resource Strain:    Difficulty of Paying Living Expenses:   Food Insecurity:    Worried About Charity fundraiser in the Last Year:    Arboriculturist in the Last Year:   Transportation Needs:    Film/video editor (Medical):    Lack of Transportation (Non-Medical):   Physical Activity:    Days of Exercise per Week:    Minutes of Exercise per Session:   Stress:    Feeling of Stress :   Social Connections:    Frequency of Communication with Friends and Family:    Frequency of Social Gatherings with Friends and Family:    Attends Religious Services:    Active Member of Clubs or Organizations:    Attends Music therapist:    Marital Status:   Intimate Partner Violence:    Fear of Current or Ex-Partner:    Emotionally Abused:    Physically Abused:    Sexually Abused:     April Hudson family history includes Asthma in her daughter and son; Colon cancer (age of onset: 65) in her maternal uncle; Diabetes in her mother; Healthy in her brother; Heart disease in her sister; Hypertension in her mother; Ovarian cancer in her mother; Stomach cancer in her maternal uncle.      Objective:    Vitals:   01/24/20 0852  BP: 108/62  Pulse: 80  Temp: 98.5 F (36.9 C)    Physical Exam Well-developed well-nourished African-American female in no acute distress.  Accompanied by her husband Weight 138, BMI 26.1  HEENT; nontraumatic normocephalic, EOMI, PER R LA, sclera anicteric. Oropharynx; not examined today Neck; supple, no  JVD Cardiovascular; regular rate and rhythm with S1-S2, no murmur rub or gallop Pulmonary; Clear bilaterally, there is chest wall tenderness to palpation bilaterally lower chest and tender over the lower left posterior ribs Abdomen; soft, nontender, nondistended, no palpable mass or hepatosplenomegaly, bowel sounds are active Rectal; not done today Skin; benign exam, no jaundice rash or appreciable lesions Extremities; no clubbing cyanosis or edema skin warm and dry Neuro/Psych; alert and oriented x4, grossly nonfocal mood and affect appropriate       Assessment & Plan:   #27 49 year old African-American female with 2 to 61-month history of epigastric and lower chest pain described as sharp in nature.  On exam she has significant  chest wall tenderness bilaterally across the lower chest, costal margins and into the left lower posterior rib area. I suspect her pain is musculoskeletal in etiology, possible costochondritis, rule out other bone pathology.  #2 chronic GERD, new intermittent solid food dysphagia rule out stricture. #3 history of large tubular adenoma 2016, and 10 mm cecal polyp at colonoscopy June 2020/tubular adenoma without high-grade dysplasia indicated for 3-year interval follow-up 4.  Diverticulosis 5.  Osteoarthritis  Plan; increase Protonix to 40 mg p.o. twice daily Schedule for upper endoscopy with possible esophageal dilation with Dr. Ardis Hughs.  Procedure was discussed in detail with the patient including indications risks and benefits and she is agreeable to proceed. Chest x-ray PA and lateral, and lower left posterior rib details. We discussed use of moist heat over affected chest wall 2-3 times daily. Also discussed a trial of Aleve twice daily versus Tylenol twice daily.  She prefers Tylenol, 500 to 750 mg p.o. twice daily. Further recommendations pending results of above.  April Ellerman Genia Harold PA-C 01/24/2020   Cc: [name], NP

## 2020-01-24 NOTE — Patient Instructions (Signed)
If you are age 51 or older, your body mass index should be between 23-30. Your Body mass index is 26.15 kg/m. If this is out of the aforementioned range listed, please consider follow up with your Primary Care Provider.  If you are age 66 or younger, your body mass index should be between 19-25. Your Body mass index is 26.15 kg/m. If this is out of the aformentioned range listed, please consider follow up with your Primary Care Provider.   You have been scheduled for an endoscopy. Please follow written instructions given to you at your visit today. If you use inhalers (even only as needed), please bring them with you on the day of your procedure. Your physician has requested that you go to www.startemmi.com and enter the access code given to you at your visit today. This web site gives a general overview about your procedure. However, you should still follow specific instructions given to you by our office regarding your preparation for the procedure.  Your provider has requested that you go to the basement level for  Chest x-ray andlab work before leaving today. Press "B" on the elevator. The lab is located at the first door on the left as you exit the elevator.  We have sent the following medications to your pharmacy for you to pick up at your convenience: Protonix - take twice daily.  Take Tylenol every 8 hours as needed for chest wall pain.  Use heating pad on chest a couple of times daily  Thank you for choosing me and [facility].   Amy Esterwood, PA-C

## 2020-01-25 NOTE — Progress Notes (Signed)
I agree with the above note, plan 

## 2020-01-28 ENCOUNTER — Ambulatory Visit
Admission: RE | Admit: 2020-01-28 | Discharge: 2020-01-28 | Disposition: A | Discharge status: Home / Self Care | Payer: 59 | Source: Ambulatory Visit | Attending: Nurse Practitioner | Admitting: Nurse Practitioner

## 2020-01-28 ENCOUNTER — Other Ambulatory Visit: Payer: Self-pay | Admitting: Nurse Practitioner

## 2020-01-28 ENCOUNTER — Other Ambulatory Visit: Payer: Self-pay

## 2020-01-28 DIAGNOSIS — R921 Mammographic calcification found on diagnostic imaging of breast: Secondary | ICD-10-CM

## 2020-01-28 DIAGNOSIS — N6320 Unspecified lump in the left breast, unspecified quadrant: Secondary | ICD-10-CM

## 2020-01-28 DIAGNOSIS — R928 Other abnormal and inconclusive findings on diagnostic imaging of breast: Secondary | ICD-10-CM

## 2020-01-30 ENCOUNTER — Other Ambulatory Visit (HOSPITAL_COMMUNITY)
Admission: RE | Admit: 2020-01-30 | Discharge: 2020-01-30 | Disposition: A | Discharge status: Home / Self Care | Payer: 59 | Source: Ambulatory Visit | Attending: Gastroenterology | Admitting: Gastroenterology

## 2020-01-30 ENCOUNTER — Other Ambulatory Visit: Payer: Self-pay

## 2020-01-30 ENCOUNTER — Encounter: Payer: Self-pay | Admitting: Gastroenterology

## 2020-01-30 ENCOUNTER — Ambulatory Visit (INDEPENDENT_AMBULATORY_CARE_PROVIDER_SITE_OTHER): Payer: 59

## 2020-01-30 DIAGNOSIS — Z20822 Contact with and (suspected) exposure to covid-19: Secondary | ICD-10-CM | POA: Diagnosis not present

## 2020-01-30 DIAGNOSIS — Z01812 Encounter for preprocedural laboratory examination: Secondary | ICD-10-CM | POA: Diagnosis present

## 2020-01-30 DIAGNOSIS — Z1159 Encounter for screening for other viral diseases: Secondary | ICD-10-CM

## 2020-01-30 LAB — SARS CORONAVIRUS 2 (TAT 6-24 HRS): SARS Coronavirus 2: NEGATIVE

## 2020-02-01 ENCOUNTER — Encounter: Payer: 59 | Admitting: Gastroenterology

## 2020-02-01 ENCOUNTER — Telehealth: Payer: Self-pay | Admitting: Gastroenterology

## 2020-02-01 NOTE — Telephone Encounter (Signed)
Patient called to reschedule her procedure for today. Said her mother is sick in ICU and rescheduled for 02/15/20.

## 2020-02-06 ENCOUNTER — Ambulatory Visit
Admission: RE | Admit: 2020-02-06 | Discharge: 2020-02-06 | Disposition: A | Discharge status: Home / Self Care | Payer: 59 | Source: Ambulatory Visit | Attending: Nurse Practitioner | Admitting: Nurse Practitioner

## 2020-02-06 ENCOUNTER — Other Ambulatory Visit: Payer: Self-pay

## 2020-02-06 DIAGNOSIS — R921 Mammographic calcification found on diagnostic imaging of breast: Secondary | ICD-10-CM

## 2020-02-06 DIAGNOSIS — N6320 Unspecified lump in the left breast, unspecified quadrant: Secondary | ICD-10-CM

## 2020-02-14 ENCOUNTER — Other Ambulatory Visit: Payer: Self-pay | Admitting: Gastroenterology

## 2020-02-14 ENCOUNTER — Ambulatory Visit (INDEPENDENT_AMBULATORY_CARE_PROVIDER_SITE_OTHER): Payer: 59

## 2020-02-14 DIAGNOSIS — Z1159 Encounter for screening for other viral diseases: Secondary | ICD-10-CM

## 2020-02-15 ENCOUNTER — Ambulatory Visit (AMBULATORY_SURGERY_CENTER): Payer: 59 | Admitting: Gastroenterology

## 2020-02-15 ENCOUNTER — Other Ambulatory Visit: Payer: Self-pay

## 2020-02-15 ENCOUNTER — Encounter: Payer: Self-pay | Admitting: Gastroenterology

## 2020-02-15 VITALS — BP 108/81 | HR 96 | Temp 96.0°F | Resp 12 | Ht 61.0 in | Wt 138.0 lb

## 2020-02-15 DIAGNOSIS — R1013 Epigastric pain: Secondary | ICD-10-CM

## 2020-02-15 DIAGNOSIS — K3189 Other diseases of stomach and duodenum: Secondary | ICD-10-CM | POA: Diagnosis not present

## 2020-02-15 DIAGNOSIS — K2970 Gastritis, unspecified, without bleeding: Secondary | ICD-10-CM

## 2020-02-15 DIAGNOSIS — K228 Other specified diseases of esophagus: Secondary | ICD-10-CM

## 2020-02-15 LAB — SARS CORONAVIRUS 2 (TAT 6-24 HRS): SARS Coronavirus 2: NEGATIVE

## 2020-02-15 MED ORDER — SODIUM CHLORIDE 0.9 % IV SOLN: 500.0000 mL | INTRAVENOUS | Status: DC

## 2020-02-15 NOTE — Progress Notes (Signed)
Called to room to assist during endoscopic procedure.  Patient ID and intended procedure confirmed with present staff. Received instructions for my participation in the procedure from the performing physician.  

## 2020-02-15 NOTE — Patient Instructions (Signed)
Handout given for gastritis.  Await pathology results.  YOU HAD AN ENDOSCOPIC PROCEDURE TODAY AT [facility]:   Refer to the procedure report that was given to you for any specific questions about what was found during the examination.  If the procedure report does not answer your questions, please call your gastroenterologist to clarify.  If you requested that your care partner not be given the details of your procedure findings, then the procedure report has been included in a sealed envelope for you to review at your convenience later.  YOU SHOULD EXPECT: Some feelings of bloating in the abdomen. Passage of more gas than usual.  Walking can help get rid of the air that was put into your GI tract during the procedure and reduce the bloating. If you had a lower endoscopy (such as a colonoscopy or flexible sigmoidoscopy) you may notice spotting of blood in your stool or on the toilet paper. If you underwent a bowel prep for your procedure, you may not have a normal bowel movement for a few days.  Please Note:  You might notice some irritation and congestion in your nose or some drainage.  This is from the oxygen used during your procedure.  There is no need for concern and it should clear up in a day or so.  SYMPTOMS TO REPORT IMMEDIATELY:   Following upper endoscopy (EGD)  Vomiting of blood or coffee ground material  New chest pain or pain under the shoulder blades  Painful or persistently difficult swallowing  New shortness of breath  Fever of 100F or higher  Black, tarry-looking stools  For urgent or emergent issues, a gastroenterologist can be reached at any hour by calling [phone]. Do not use MyChart messaging for urgent concerns.    DIET:  We do recommend a small meal at first, but then you may proceed to your regular diet.  Drink plenty of fluids but you should avoid alcoholic beverages for 24 hours.  ACTIVITY:  You should plan to take it easy for the rest  of today and you should NOT DRIVE or use heavy machinery until tomorrow (because of the sedation medicines used during the test).    FOLLOW UP: Our staff will call the number listed on your records 48-72 hours following your procedure to check on you and address any questions or concerns that you may have regarding the information given to you following your procedure. If we do not reach you, we will leave a message.  We will attempt to reach you two times.  During this call, we will ask if you have developed any symptoms of COVID 19. If you develop any symptoms (ie: fever, flu-like symptoms, shortness of breath, cough etc.) before then, please call [phone].  If you test positive for Covid 19 in the 2 weeks post procedure, please call and report this information to [place].    If any biopsies were taken you will be contacted by phone or by letter within the next 1-3 weeks.  Please call us at [phone] if you have not heard about the biopsies in 3 weeks.    SIGNATURES/CONFIDENTIALITY: You and/or your care partner have signed paperwork which will be entered into your electronic medical record.  These signatures attest to the fact that that the information above on your After Visit Summary has been reviewed and is understood.  Full responsibility of the confidentiality of this discharge information lies with you and/or your care-partner.

## 2020-02-15 NOTE — Progress Notes (Signed)
Temp JB  CW  V/s

## 2020-02-15 NOTE — Op Note (Addendum)
[place] Patient Name: April Hudson Procedure Date: 02/15/2020 10:59 AM MRN: [id number] Endoscopist: Milus Banister , MD Age: 49 Referring MD:  Date of Birth: Jul 16, 1971 Gender: Female Account #: [id number] Procedure:                Upper GI endoscopy Indications:              Epigastric abdominal pain, Dysphagia Medicines:                Monitored Anesthesia Care Procedure:                Pre-Anesthesia Assessment:                           - Prior to the procedure, a History and Physical                            was performed, and patient medications and                            allergies were reviewed. The patient's tolerance of                            previous anesthesia was also reviewed. The risks                            and benefits of the procedure and the sedation                            options and risks were discussed with the patient.                            All questions were answered, and informed consent                            was obtained. Prior Anticoagulants: The patient has                            taken no previous anticoagulant or antiplatelet                            agents. ASA Grade Assessment: II - A patient with                            mild systemic disease. After reviewing the risks                            and benefits, the patient was deemed in                            satisfactory condition to undergo the procedure.                           After obtaining informed consent, the endoscope was  passed under direct vision. Throughout the                            procedure, the patient's blood pressure, pulse, and                            oxygen saturations were monitored continuously. The                            Endoscope was introduced through the mouth, and                            advanced to the second part of duodenum. The upper                            GI endoscopy  was accomplished without difficulty.                            The patient tolerated the procedure well. Scope In: Scope Out: Findings:                 The esophagus and GE junction were normal. Biopsies                            were taken from distal (jar 2) and proximal (jar 3)                            esopahgus.                           Mild inflammation characterized by erythema,                            friability and granularity was found in the entire                            examined stomach. Biopsies were taken with a cold                            forceps for histology. Jar 1.                           The exam was otherwise without abnormality. Complications:            No immediate complications. Estimated blood loss:                            None. Estimated Blood Loss:     Estimated blood loss: none. Impression:               - Mild, non-specific gastritis. Biopsied to check                            for H. pylori.                           -  Normal esophagus and GE junction. Esophagus                            biopsied to check for Eosinophilic Esophagitis.                           - The examination was otherwise normal. Recommendation:           - Patient has a contact number available for                            emergencies. The signs and symptoms of potential                            delayed complications were discussed with the                            patient. Return to normal activities tomorrow.                            Written discharge instructions were provided to the                            patient.                           - Resume previous diet.                           - Continue present medications.                           - Await pathology results. Milus Banister, MD 02/15/2020 11:20:46 AM This report has been signed electronically.

## 2020-02-15 NOTE — Progress Notes (Signed)
To PACU, VSS. Report to [email or web address] 

## 2020-02-19 ENCOUNTER — Telehealth: Payer: Self-pay

## 2020-02-19 NOTE — Telephone Encounter (Signed)
  Follow up Call-  Call back number 02/15/2020 03/13/2019 06/21/2018  Post procedure Call Back phone  # [phone]  Permission to leave phone message Yes Yes Yes  Some recent data might be hidden     Patient questions:  Do you have a fever, pain , or abdominal swelling? No. Pain Score  0 *  Have you tolerated food without any problems? Yes.    Have you been able to return to your normal activities? Yes.    Do you have any questions about your discharge instructions: Diet   No. Medications  No. Follow up visit  No.  Do you have questions or concerns about your Care? No.  Actions: * If pain score is 4 or above: No action needed, pain <4.   1. Have you developed a fever since your procedure? no  2.   Have you had an respiratory symptoms (SOB or cough) since your procedure? no  3.   Have you tested positive for COVID 19 since your procedure no  4.   Have you had any family members/close contacts diagnosed with the COVID 19 since your procedure?  no   If yes to any of these questions please route to Joylene John, RN and Erenest Rasher, RN

## 2020-02-20 ENCOUNTER — Other Ambulatory Visit: Payer: Self-pay

## 2020-02-20 ENCOUNTER — Encounter: Payer: Self-pay | Admitting: Internal Medicine

## 2020-02-20 ENCOUNTER — Ambulatory Visit: Payer: 59 | Attending: Internal Medicine | Admitting: Internal Medicine

## 2020-02-20 VITALS — BP 116/71 | HR 90 | Temp 98.0°F | Resp 18 | Ht 61.0 in | Wt 139.0 lb

## 2020-02-20 DIAGNOSIS — Z202 Contact with and (suspected) exposure to infections with a predominantly sexual mode of transmission: Secondary | ICD-10-CM | POA: Diagnosis not present

## 2020-02-20 NOTE — Progress Notes (Signed)
Pt's husband has had an "affair" and she wants to be checked out. She feels well, no complaints. No vaginal sxs. No pelvic pain or other concerns. She has had a complete hysterectomy. (10+ years ago).   Reviewed chart and recent pathology with patient regarding egd and breast biopsy.   Past Medical History:  Diagnosis Date  . Carpal tunnel syndrome of right wrist   . Ganglion cyst of wrist, right   . History of adenomatous polyp of colon    tubular adenoma's 09/ 2016;  06/ 2017  . History of Clostridium difficile colitis 05/21/2016  . History of frequent urinary tract infections   . History of gastric ulcer    clinical diagnosis, no prior EGD  . History of uterine fibroid   . Migraines   . OA (osteoarthritis)     Social History   Socioeconomic History  . Marital status: Married    Spouse name: Not on file  . Number of children: 2  . Years of education: Not on file  . Highest education level: GED or equivalent  Occupational History  . Occupation: unemployed    Comment: Agricultural engineer  Tobacco Use  . Smoking status: Current Every Day Smoker    Packs/day: 1.00    Years: 29.00    Pack years: 29.00    Types: Cigarettes  . Smokeless tobacco: Never Used  Substance and Sexual Activity  . Alcohol use: No    Alcohol/week: 0.0 standard drinks  . Drug use: No  . Sexual activity: Yes    Birth control/protection: Surgical  Other Topics Concern  . Not on file  Social History Narrative   Lives with husband in a one story home.     Has 6 children (2 biological).     Works at [employer]..  Education: GED   Social Determinants of Health   Financial Resource Strain:   . Difficulty of Paying Living Expenses:   Food Insecurity:   . Worried About Charity fundraiser in the Last Year:   . Arboriculturist in the Last Year:   Transportation Needs:   . Film/video editor (Medical):   Marland Kitchen Lack of Transportation (Non-Medical):   Physical Activity:   . Days of Exercise per Week:   .  Minutes of Exercise per Session:   Stress:   . Feeling of Stress :   Social Connections:   . Frequency of Communication with Friends and Family:   . Frequency of Social Gatherings with Friends and Family:   . Attends Religious Services:   . Active Member of Clubs or Organizations:   . Attends Archivist Meetings:   Marland Kitchen Marital Status:   Intimate Partner Violence:   . Fear of Current or Ex-Partner:   . Emotionally Abused:   Marland Kitchen Physically Abused:   . Sexually Abused:     Past Surgical History:  Procedure Laterality Date  . CARPAL TUNNEL RELEASE Right 01/13/2017   Procedure: CARPAL TUNNEL RELEASE;  Surgeon: Iran Planas, MD;  Location: [facility];  Service: Orthopedics;  Laterality: Right;  . COLONOSCOPY  last one 01/ 03/ 2018   sigmoid scope  . GANGLION CYST EXCISION Right 01/13/2017   Procedure: REMOVAL GANGLION OF WRIST;  Surgeon: Iran Planas, MD;  Location: [facility];  Service: Orthopedics;  Laterality: Right;  . KNEE ARTHROSCOPY Left ~ 2015  . VAGINAL HYSTERECTOMY  2006    Family History  Problem Relation Age of Onset  . Diabetes  Mother   . Hypertension Mother   . Ovarian cancer Mother   . Stomach cancer Maternal Uncle   . Heart disease Sister   . Healthy Brother   . Asthma Son   . Asthma Daughter   . Colon cancer Maternal Uncle 33  . Colon polyps Neg Hx   . Esophageal cancer Neg Hx   . Rectal cancer Neg Hx     Allergies  Allergen Reactions  . Sulfa Antibiotics Hives and Rash    Current Outpatient Medications on File Prior to Visit  Medication Sig Dispense Refill  . benzonatate (TESSALON) 100 MG capsule Take 1 capsule (100 mg total) by mouth every 8 (eight) hours. 42 capsule 1  . cyclobenzaprine (FLEXERIL) 5 MG tablet Take 1 tablet (5 mg total) by mouth 3 (three) times daily as needed for muscle spasms. 60 tablet 1  . ondansetron (ZOFRAN) 4 MG tablet Take 1 tablet (4 mg total) by mouth every 6 (six) hours. 12 tablet 0  .  pantoprazole (PROTONIX) 40 MG tablet Take 1 tablet (40 mg total) by mouth 2 (two) times daily. Take before breakfast and dinner 60 tablet 1   No current facility-administered medications on file prior to visit.     patient denies chest pain, shortness of breath, orthopnea. Denies lower extremity edema, abdominal pain, change in appetite, change in bowel movements. Patient denies rashes, musculoskeletal complaints. No other specific complaints in a complete review of systems.   BP 116/71 (BP Location: Left Arm, Patient Position: Sitting, Cuff Size: Normal)   Pulse 90   Temp 98 F (36.7 C)   Resp 18   Ht 5\' 1"  (1.549 m)   Wt 139 lb (63 kg)   SpO2 97%   BMI 26.26 kg/m     Well-developed well-nourished female in no acute distress. HEENT exam atraumatic, normocephalic, extraocular muscles are intact. Neck is supple. No jugular venous distention no thyromegaly. Chest clear to auscultation without increased work of breathing. Cardiac exam S1 and S2 are regular. Abdominal exam active bowel sounds, soft, nontender. Extremities no edema. Neurologic exam she is alert without any motor sensory deficits. Gait is normal.  A/p- sexually transmitted disease exposure without sxs.  Rpr, hiv, urine for gc/chlamydia

## 2020-02-20 NOTE — Patient Instructions (Signed)
We will call with results

## 2020-02-20 NOTE — Progress Notes (Signed)
49 Yrs old female  stated her husband had an affair would like to get tested for STD and HIV .Denies any S&S at this time  Pt will get her COVID vaccine today

## 2020-02-21 LAB — HIV ANTIBODY (ROUTINE TESTING W REFLEX): HIV Screen 4th Generation wRfx: NONREACTIVE

## 2020-02-21 LAB — RPR: RPR Ser Ql: NONREACTIVE

## 2020-02-27 ENCOUNTER — Other Ambulatory Visit: Payer: Self-pay

## 2020-02-27 DIAGNOSIS — K219 Gastro-esophageal reflux disease without esophagitis: Secondary | ICD-10-CM

## 2020-02-27 DIAGNOSIS — R1310 Dysphagia, unspecified: Secondary | ICD-10-CM

## 2020-02-27 DIAGNOSIS — R1013 Epigastric pain: Secondary | ICD-10-CM

## 2020-02-28 ENCOUNTER — Ambulatory Visit: Payer: Self-pay | Admitting: General Surgery

## 2020-02-28 DIAGNOSIS — N6092 Unspecified benign mammary dysplasia of left breast: Secondary | ICD-10-CM

## 2020-03-25 ENCOUNTER — Encounter (HOSPITAL_COMMUNITY): Payer: Self-pay | Admitting: Emergency Medicine

## 2020-03-25 ENCOUNTER — Emergency Department (HOSPITAL_COMMUNITY)
Admission: EM | Admit: 2020-03-25 | Discharge: 2020-03-26 | Disposition: A | Discharge status: Home / Self Care | Payer: 59 | Attending: Emergency Medicine | Admitting: Emergency Medicine

## 2020-03-25 DIAGNOSIS — R197 Diarrhea, unspecified: Secondary | ICD-10-CM | POA: Insufficient documentation

## 2020-03-25 DIAGNOSIS — Z5321 Procedure and treatment not carried out due to patient leaving prior to being seen by health care provider: Secondary | ICD-10-CM | POA: Insufficient documentation

## 2020-03-25 DIAGNOSIS — R109 Unspecified abdominal pain: Secondary | ICD-10-CM | POA: Insufficient documentation

## 2020-03-25 MED ORDER — SODIUM CHLORIDE 0.9% FLUSH: 3.0000 mL | Freq: Once | INTRAVENOUS | Status: DC

## 2020-03-25 NOTE — ED Notes (Signed)
Pt did not answer for urine specimen collection.

## 2020-03-25 NOTE — ED Triage Notes (Signed)
Pt. Stated, My stomach has been hurting for 3 days with loose bowels.

## 2020-03-25 NOTE — ED Notes (Signed)
Pt called x3 for role call with no response. °

## 2020-03-27 ENCOUNTER — Other Ambulatory Visit: Payer: Self-pay | Admitting: General Surgery

## 2020-03-27 DIAGNOSIS — N6092 Unspecified benign mammary dysplasia of left breast: Secondary | ICD-10-CM

## 2020-04-07 ENCOUNTER — Encounter (HOSPITAL_COMMUNITY): Payer: Self-pay | Admitting: Emergency Medicine

## 2020-04-07 ENCOUNTER — Other Ambulatory Visit: Payer: Self-pay

## 2020-04-07 ENCOUNTER — Ambulatory Visit (HOSPITAL_COMMUNITY)
Admission: EM | Admit: 2020-04-07 | Discharge: 2020-04-07 | Disposition: A | Discharge status: Home / Self Care | Payer: 59 | Attending: Family Medicine | Admitting: Family Medicine

## 2020-04-07 DIAGNOSIS — R197 Diarrhea, unspecified: Secondary | ICD-10-CM | POA: Insufficient documentation

## 2020-04-07 DIAGNOSIS — R1032 Left lower quadrant pain: Secondary | ICD-10-CM | POA: Insufficient documentation

## 2020-04-07 MED ORDER — AMOXICILLIN-POT CLAVULANATE 875-125 MG PO TABS: 1.0000 | ORAL_TABLET | Freq: Two times a day (BID) | ORAL | 0 refills | Status: DC

## 2020-04-07 MED ORDER — ONDANSETRON 4 MG PO TBDP
4.0000 mg | ORAL_TABLET | Freq: Three times a day (TID) | ORAL | 0 refills | Status: DC | PRN
Start: 2020-04-07 — End: 2020-04-23

## 2020-04-07 MED ORDER — TRAMADOL HCL 50 MG PO TABS: 50.0000 mg | ORAL_TABLET | Freq: Two times a day (BID) | ORAL | 0 refills | Status: AC | PRN

## 2020-04-07 NOTE — ED Triage Notes (Addendum)
Pt arrives with c/o of body aches on set last night. No fever recorded pt sts "ive been hot and cold." C/o of "real green" diarrhea. Denies N/V, SOB.   Pt tested postive for COVID in April 2021.

## 2020-04-07 NOTE — Discharge Instructions (Signed)
Treating you for possible diverticulitis. Amoxicillin twice a day for the next 7 days. Tramadol for severe pain as needed.  Zofran for nausea, vomiting as needed. As you are drinking plenty of fluids staying hydrated to include Gatorade and water. Follow up as needed for continued or worsening symptoms

## 2020-04-08 LAB — GASTROINTESTINAL PANEL BY PCR, STOOL (REPLACES STOOL CULTURE)

## 2020-04-08 MED FILL — traMADol HCL 50 MG TABS: 50 | 3 days supply | Qty: 6 | Fill #0

## 2020-04-08 MED FILL — AMOX-CLAV 875-125 MG TABLET: 875-125 | 14 days supply | Qty: 14 | Fill #0

## 2020-04-08 MED FILL — ONDANSETRON ODT 4 MG TABLET: 4 | 21 days supply | Qty: 18 | Fill #0

## 2020-04-08 NOTE — ED Provider Notes (Signed)
[place]    CSN: [id number] Arrival date & time: 04/07/20  1154      History   Chief Complaint Chief Complaint  Patient presents with  . Generalized Body Aches    [name] is a 49 y.o. female.   Patient is a 49 year old female past medical history of C. difficile, carpal tunnel, gastric ulcer, osteoarthritis, migraines, reflux.  She presents today with generalized body aches with onset last night.  She is also had some left lower quadrant pain, chills and green stools. Mild nausea without vomiting.  Reporting 5 episodes of diarrhea last night and 2 this morning.  Tested positive for Covid in April.  No recorded fevers at home.  No recent antibiotic use.  Prior was having regular bowel movements.  No recent sick contacts or recent traveling.  ROS per HPI      Past Medical History:  Diagnosis Date  . Carpal tunnel syndrome of right wrist   . Ganglion cyst of wrist, right   . History of adenomatous polyp of colon    tubular adenoma's 09/ 2016;  06/ 2017  . History of Clostridium difficile colitis 05/21/2016  . History of frequent urinary tract infections   . History of gastric ulcer    clinical diagnosis, no prior EGD  . History of uterine fibroid   . Migraines   . OA (osteoarthritis)     Patient Active Problem List   Diagnosis Date Noted  . Influenza A 10/23/2017  . Ganglion cyst of wrist, right 09/16/2016  . Esophageal reflux 05/24/2016  . Anal fissure 10/23/2015  . Epidermoid cyst 09/02/2015  . 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 Tinea pedis 05/26/2015  . Pain of left thumb 05/26/2015  . 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 De Quervain's tenosynovitis, left 04/14/2015  . Neck pain on right side 01/13/2015  . Carpal tunnel syndrome 12/30/2014  . Smoker 07/03/2014  . Family history of stomach cancer 07/03/2014    Past Surgical History:  Procedure Laterality Date  . CARPAL TUNNEL RELEASE Right 01/13/2017    Procedure: CARPAL TUNNEL RELEASE;  Surgeon: Iran Planas, MD;  Location: [facility];  Service: Orthopedics;  Laterality: Right;  . COLONOSCOPY  last one 01/ 03/ 2018   sigmoid scope  . GANGLION CYST EXCISION Right 01/13/2017   Procedure: REMOVAL GANGLION OF WRIST;  Surgeon: Iran Planas, MD;  Location: [facility];  Service: Orthopedics;  Laterality: Right;  . KNEE ARTHROSCOPY Left ~ 2015  . VAGINAL HYSTERECTOMY  2006    OB History   No obstetric history on file.      Home Medications    Prior to Admission medications   Medication Sig Start Date End Date Taking? Authorizing Provider  amoxicillin-clavulanate (AUGMENTIN) 875-125 MG tablet Take 1 tablet by mouth every 12 (twelve) hours. 04/07/20   Aleksei Goodlin, Tressia Miners A, NP  ondansetron (ZOFRAN ODT) 4 MG disintegrating tablet Take 1 tablet (4 mg total) by mouth every 8 (eight) hours as needed for nausea or vomiting. 04/07/20   Loura Halt A, NP  traMADol (ULTRAM) 50 MG tablet Take 1 tablet (50 mg total) by mouth every 12 (twelve) hours as needed for up to 3 days. 04/07/20 04/10/20  Loura Halt A, NP  pantoprazole (PROTONIX) 40 MG tablet Take 1 tablet (40 mg total) by mouth 2 (two) times daily. Take before breakfast and dinner 01/24/20 04/07/20  Alfredia Ferguson, PA-C    Family History Family History  Problem  Relation Age of Onset  . Diabetes Mother   . Hypertension Mother   . Ovarian cancer Mother   . Stomach cancer Maternal Uncle   . Heart disease Sister   . Healthy Brother   . Asthma Son   . Asthma Daughter   . Colon cancer Maternal Uncle 62  . Colon polyps Neg Hx   . Esophageal cancer Neg Hx   . Rectal cancer Neg Hx     Social History Social History   Tobacco Use  . Smoking status: Current Every Day Smoker    Packs/day: 1.00    Years: 29.00    Pack years: 29.00    Types: Cigarettes  . Smokeless tobacco: Never Used  Vaping Use  . Vaping Use: Never used  Substance Use Topics  . Alcohol use: No     Alcohol/week: 0.0 standard drinks  . Drug use: No     Allergies   Sulfa antibiotics   Review of Systems Review of Systems   Physical Exam Triage Vital Signs ED Triage Vitals  Enc Vitals Group     BP 04/07/20 1255 98/72     Pulse Rate 04/07/20 1255 97     Resp 04/07/20 1255 16     Temp 04/07/20 1255 98.5 F (36.9 C)     Temp Source 04/07/20 1255 Oral     SpO2 04/07/20 1255 100 %     Weight 04/07/20 1253 134 lb (60.8 kg)     Height 04/07/20 1253 5' 1.5" (1.562 m)     Head Circumference --      Peak Flow --      Pain Score 04/07/20 1253 9     Pain Loc --      Pain Edu? --      Excl. in [place]? --    No data found.  Updated Vital Signs BP 98/72 (BP Location: Left Arm)   Pulse 97   Temp 98.5 F (36.9 C) (Oral)   Resp 16   Ht 5' 1.5" (1.562 m)   Wt 134 lb (60.8 kg)   SpO2 100%   BMI 24.91 kg/m   Visual Acuity Right Eye Distance:   Left Eye Distance:   Bilateral Distance:    Right Eye Near:   Left Eye Near:    Bilateral Near:     Physical Exam Vitals and nursing note reviewed.  Constitutional:      General: She is not in acute distress.    Appearance: Normal appearance. She is not ill-appearing, toxic-appearing or diaphoretic.  HENT:     Head: Normocephalic.     Nose: Nose normal.     Mouth/Throat:     Pharynx: Oropharynx is clear.  Eyes:     Conjunctiva/sclera: Conjunctivae normal.  Pulmonary:     Effort: Pulmonary effort is normal.  Abdominal:     Tenderness: There is abdominal tenderness.     Comments: TTP of LLQ with mild guarding.   Musculoskeletal:        General: Normal range of motion.     Cervical back: Normal range of motion.  Skin:    General: Skin is warm and dry.     Findings: No rash.  Neurological:     Mental Status: She is alert.  Psychiatric:        Mood and Affect: Mood normal.      UC Treatments / Results  Labs (all labs ordered are listed, but only abnormal results are displayed) Labs Reviewed  GASTROINTESTINAL PANEL  BY PCR, STOOL (REPLACES STOOL CULTURE)    EKG   Radiology No results found.  Procedures Procedures (including critical care time)  Medications Ordered in UC Medications - No data to display  Initial Impression / Assessment and Plan / UC Course  I have reviewed the triage vital signs and the nursing notes.  Pertinent labs & imaging results that were available during my care of the patient were reviewed by me and considered in my medical decision making (see chart for details).     Left lower quadrant pain and diarrhea. Patient reporting history of diverticulosis.  Based on symptoms, abdominal pain and history we will go ahead and cover for diverticulitis.  Treated with amoxicillin. Sent stool for testing Zofran for nausea, vomiting as needed and tramadol for severe pain as needed.  Make sure she is drinking plenty of fluids and staying hydrated. For worsening abdominal pain, nausea or vomiting she will need to go to the ER. Stool pending.  Final Clinical Impressions(s) / UC Diagnoses   Final diagnoses:  LLQ pain  Diarrhea, unspecified type     Discharge Instructions     Treating you for possible diverticulitis. Amoxicillin twice a day for the next 7 days. Tramadol for severe pain as needed.  Zofran for nausea, vomiting as needed. As you are drinking plenty of fluids staying hydrated to include Gatorade and water. Follow up as needed for continued or worsening symptoms     ED Prescriptions    Medication Sig Dispense Auth. Provider   amoxicillin-clavulanate (AUGMENTIN) 875-125 MG tablet Take 1 tablet by mouth every 12 (twelve) hours. 14 tablet Tocara Mennen A, NP   traMADol (ULTRAM) 50 MG tablet Take 1 tablet (50 mg total) by mouth every 12 (twelve) hours as needed for up to 3 days. 6 tablet Yury Schaus A, NP   ondansetron (ZOFRAN ODT) 4 MG disintegrating tablet Take 1 tablet (4 mg total) by mouth every 8 (eight) hours as needed for nausea or vomiting. 20 tablet Benjimin Hadden,  Anslie Spadafora A, NP     I have reviewed the PDMP during this encounter.   Loura Halt A, NP 04/08/20 1147

## 2020-04-23 ENCOUNTER — Other Ambulatory Visit: Payer: Self-pay

## 2020-04-23 ENCOUNTER — Encounter (HOSPITAL_BASED_OUTPATIENT_CLINIC_OR_DEPARTMENT_OTHER): Payer: Self-pay | Admitting: General Surgery

## 2020-04-28 ENCOUNTER — Other Ambulatory Visit (HOSPITAL_COMMUNITY)
Admission: RE | Admit: 2020-04-28 | Discharge: 2020-04-28 | Disposition: A | Discharge status: Home / Self Care | Payer: 59 | Source: Ambulatory Visit | Attending: Gastroenterology | Admitting: Gastroenterology

## 2020-04-28 DIAGNOSIS — Z20822 Contact with and (suspected) exposure to covid-19: Secondary | ICD-10-CM | POA: Insufficient documentation

## 2020-04-28 DIAGNOSIS — Z01812 Encounter for preprocedural laboratory examination: Secondary | ICD-10-CM | POA: Diagnosis present

## 2020-04-28 LAB — SARS CORONAVIRUS 2 (TAT 6-24 HRS): SARS Coronavirus 2: NEGATIVE

## 2020-04-30 ENCOUNTER — Ambulatory Visit
Admission: RE | Admit: 2020-04-30 | Discharge: 2020-04-30 | Disposition: A | Discharge status: Home / Self Care | Payer: 59 | Source: Ambulatory Visit | Attending: General Surgery | Admitting: General Surgery

## 2020-04-30 ENCOUNTER — Other Ambulatory Visit: Payer: Self-pay | Admitting: General Surgery

## 2020-04-30 ENCOUNTER — Ambulatory Visit: Payer: Self-pay | Admitting: General Surgery

## 2020-04-30 ENCOUNTER — Encounter (HOSPITAL_COMMUNITY): Admission: RE | Payer: Self-pay | Source: Home / Self Care

## 2020-04-30 ENCOUNTER — Ambulatory Visit (HOSPITAL_COMMUNITY): Admission: RE | Admit: 2020-04-30 | Payer: 59 | Source: Home / Self Care | Admitting: Gastroenterology

## 2020-04-30 DIAGNOSIS — N6092 Unspecified benign mammary dysplasia of left breast: Secondary | ICD-10-CM

## 2020-04-30 DIAGNOSIS — N6091 Unspecified benign mammary dysplasia of right breast: Secondary | ICD-10-CM

## 2020-04-30 SURGERY — MANOMETRY, ESOPHAGUS
Anesthesia: Choice

## 2020-05-01 ENCOUNTER — Ambulatory Visit
Admission: RE | Admit: 2020-05-01 | Discharge: 2020-05-01 | Disposition: A | Discharge status: Home / Self Care | Payer: 59 | Source: Ambulatory Visit | Attending: General Surgery | Admitting: General Surgery

## 2020-05-01 ENCOUNTER — Encounter (HOSPITAL_BASED_OUTPATIENT_CLINIC_OR_DEPARTMENT_OTHER): Payer: Self-pay | Admitting: General Surgery

## 2020-05-01 ENCOUNTER — Ambulatory Visit (HOSPITAL_BASED_OUTPATIENT_CLINIC_OR_DEPARTMENT_OTHER): Payer: 59 | Admitting: Anesthesiology

## 2020-05-01 ENCOUNTER — Encounter (HOSPITAL_BASED_OUTPATIENT_CLINIC_OR_DEPARTMENT_OTHER)
Admission: RE | Disposition: A | Discharge status: Home / Self Care | Payer: Self-pay | Source: Home / Self Care | Attending: General Surgery

## 2020-05-01 ENCOUNTER — Other Ambulatory Visit: Payer: Self-pay

## 2020-05-01 ENCOUNTER — Ambulatory Visit (HOSPITAL_BASED_OUTPATIENT_CLINIC_OR_DEPARTMENT_OTHER)
Admission: RE | Admit: 2020-05-01 | Discharge: 2020-05-01 | Disposition: A | Discharge status: Home / Self Care | Payer: 59 | Attending: General Surgery | Admitting: General Surgery

## 2020-05-01 DIAGNOSIS — Z87891 Personal history of nicotine dependence: Secondary | ICD-10-CM | POA: Insufficient documentation

## 2020-05-01 DIAGNOSIS — Z8249 Family history of ischemic heart disease and other diseases of the circulatory system: Secondary | ICD-10-CM | POA: Diagnosis not present

## 2020-05-01 DIAGNOSIS — Z882 Allergy status to sulfonamides status: Secondary | ICD-10-CM | POA: Insufficient documentation

## 2020-05-01 DIAGNOSIS — M1990 Unspecified osteoarthritis, unspecified site: Secondary | ICD-10-CM | POA: Insufficient documentation

## 2020-05-01 DIAGNOSIS — K219 Gastro-esophageal reflux disease without esophagitis: Secondary | ICD-10-CM | POA: Diagnosis not present

## 2020-05-01 DIAGNOSIS — Z8261 Family history of arthritis: Secondary | ICD-10-CM | POA: Insufficient documentation

## 2020-05-01 DIAGNOSIS — N6091 Unspecified benign mammary dysplasia of right breast: Secondary | ICD-10-CM | POA: Diagnosis not present

## 2020-05-01 DIAGNOSIS — Z90710 Acquired absence of both cervix and uterus: Secondary | ICD-10-CM | POA: Diagnosis not present

## 2020-05-01 DIAGNOSIS — N6092 Unspecified benign mammary dysplasia of left breast: Secondary | ICD-10-CM

## 2020-05-01 HISTORY — DX: Unspecified lump in the left breast, unspecified quadrant: N63.20

## 2020-05-01 HISTORY — PX: BREAST LUMPECTOMY WITH RADIOACTIVE SEED LOCALIZATION: SHX6424

## 2020-05-01 SURGERY — BREAST LUMPECTOMY WITH RADIOACTIVE SEED LOCALIZATION
Anesthesia: General | Site: Breast | Laterality: Left

## 2020-05-01 MED ORDER — PROPOFOL 500 MG/50ML IV EMUL
INTRAVENOUS | Status: DC | PRN
  Administered 2020-05-01: 150 ug/kg/min via INTRAVENOUS

## 2020-05-01 MED ORDER — ACETAMINOPHEN 500 MG PO TABS: 1000.0000 mg | ORAL_TABLET | Freq: Once | ORAL | Status: DC | PRN

## 2020-05-01 MED ORDER — OXYCODONE HCL 5 MG PO TABS
ORAL_TABLET | ORAL | Status: AC
  Filled 2020-05-01: qty 1

## 2020-05-01 MED ORDER — LIDOCAINE 2% (20 MG/ML) 5 ML SYRINGE
INTRAMUSCULAR | Status: AC
  Filled 2020-05-01: qty 5

## 2020-05-01 MED ORDER — LIDOCAINE-EPINEPHRINE (PF) 1 %-1:200000 IJ SOLN
INTRAMUSCULAR | Status: AC
  Filled 2020-05-01: qty 30

## 2020-05-01 MED ORDER — ACETAMINOPHEN 500 MG PO TABS
ORAL_TABLET | ORAL | Status: AC
  Filled 2020-05-01: qty 2

## 2020-05-01 MED ORDER — CHLORHEXIDINE GLUCONATE CLOTH 2 % EX PADS: 6.0000 | MEDICATED_PAD | Freq: Once | CUTANEOUS | Status: DC

## 2020-05-01 MED ORDER — FENTANYL CITRATE (PF) 100 MCG/2ML IJ SOLN
INTRAMUSCULAR | Status: DC | PRN
  Administered 2020-05-01 (×2): 50 ug via INTRAVENOUS
  Administered 2020-05-01: 25 ug via INTRAVENOUS
  Administered 2020-05-01: 50 ug via INTRAVENOUS
  Administered 2020-05-01: 25 ug via INTRAVENOUS

## 2020-05-01 MED ORDER — ACETAMINOPHEN 500 MG PO TABS
1000.0000 mg | ORAL_TABLET | ORAL | Status: AC
  Administered 2020-05-01: 1000 mg via ORAL

## 2020-05-01 MED ORDER — MIDAZOLAM HCL 2 MG/2ML IJ SOLN
INTRAMUSCULAR | Status: DC | PRN
  Administered 2020-05-01: 2 mg via INTRAVENOUS

## 2020-05-01 MED ORDER — GABAPENTIN 300 MG PO CAPS
ORAL_CAPSULE | ORAL | Status: AC
  Filled 2020-05-01: qty 1

## 2020-05-01 MED ORDER — PROPOFOL 10 MG/ML IV BOLUS
INTRAVENOUS | Status: AC
  Filled 2020-05-01: qty 20

## 2020-05-01 MED ORDER — FENTANYL CITRATE (PF) 100 MCG/2ML IJ SOLN
INTRAMUSCULAR | Status: AC
  Filled 2020-05-01: qty 2

## 2020-05-01 MED ORDER — MIDAZOLAM HCL 2 MG/2ML IJ SOLN
INTRAMUSCULAR | Status: AC
  Filled 2020-05-01: qty 2

## 2020-05-01 MED ORDER — FENTANYL CITRATE (PF) 100 MCG/2ML IJ SOLN
25.0000 ug | INTRAMUSCULAR | Status: DC | PRN
  Administered 2020-05-01 (×2): 50 ug via INTRAVENOUS

## 2020-05-01 MED ORDER — ACETAMINOPHEN 500 MG PO TABS: 1000.0000 mg | ORAL_TABLET | ORAL | Status: DC

## 2020-05-01 MED ORDER — OXYCODONE HCL 5 MG PO TABS
5.0000 mg | ORAL_TABLET | Freq: Once | ORAL | Status: AC | PRN
  Administered 2020-05-01: 5 mg via ORAL

## 2020-05-01 MED ORDER — KETOROLAC TROMETHAMINE 30 MG/ML IJ SOLN
INTRAMUSCULAR | Status: DC | PRN
Start: 2020-05-01 — End: 2020-05-01
  Administered 2020-05-01: 30 mg via INTRAVENOUS

## 2020-05-01 MED ORDER — GABAPENTIN 300 MG PO CAPS: 300.0000 mg | ORAL_CAPSULE | ORAL | Status: DC

## 2020-05-01 MED ORDER — ACETAMINOPHEN 10 MG/ML IV SOLN: 1000.0000 mg | Freq: Once | INTRAVENOUS | Status: DC | PRN

## 2020-05-01 MED ORDER — BUPIVACAINE HCL (PF) 0.25 % IJ SOLN
INTRAMUSCULAR | Status: DC | PRN
Start: 2020-05-01 — End: 2020-05-01
  Administered 2020-05-01: 16 mL

## 2020-05-01 MED ORDER — CEFAZOLIN SODIUM-DEXTROSE 2-4 GM/100ML-% IV SOLN: 2.0000 g | INTRAVENOUS | Status: DC

## 2020-05-01 MED ORDER — DEXAMETHASONE SODIUM PHOSPHATE 10 MG/ML IJ SOLN
INTRAMUSCULAR | Status: DC | PRN
  Administered 2020-05-01: 10 mg via INTRAVENOUS

## 2020-05-01 MED ORDER — PROPOFOL 10 MG/ML IV BOLUS
INTRAVENOUS | Status: DC | PRN
  Administered 2020-05-01: 140 mg via INTRAVENOUS

## 2020-05-01 MED ORDER — LIDOCAINE 2% (20 MG/ML) 5 ML SYRINGE
INTRAMUSCULAR | Status: DC | PRN
  Administered 2020-05-01 (×2): 50 mg via INTRAVENOUS

## 2020-05-01 MED ORDER — CEFAZOLIN SODIUM-DEXTROSE 2-4 GM/100ML-% IV SOLN
2.0000 g | INTRAVENOUS | Status: AC
  Administered 2020-05-01: 2 g via INTRAVENOUS

## 2020-05-01 MED ORDER — OXYCODONE HCL 5 MG/5ML PO SOLN: 5.0000 mg | Freq: Once | ORAL | Status: AC | PRN

## 2020-05-01 MED ORDER — HYDROCODONE-ACETAMINOPHEN 5-325 MG PO TABS: 1.0000 | ORAL_TABLET | Freq: Four times a day (QID) | ORAL | 0 refills | Status: DC | PRN

## 2020-05-01 MED ORDER — BUPIVACAINE HCL (PF) 0.25 % IJ SOLN
INTRAMUSCULAR | Status: AC
  Filled 2020-05-01: qty 90

## 2020-05-01 MED ORDER — GABAPENTIN 300 MG PO CAPS
300.0000 mg | ORAL_CAPSULE | ORAL | Status: AC
  Administered 2020-05-01: 300 mg via ORAL

## 2020-05-01 MED ORDER — ACETAMINOPHEN 160 MG/5ML PO SOLN: 1000.0000 mg | Freq: Once | ORAL | Status: DC | PRN

## 2020-05-01 MED ORDER — CEFAZOLIN SODIUM-DEXTROSE 2-4 GM/100ML-% IV SOLN
INTRAVENOUS | Status: AC
  Filled 2020-05-01: qty 100

## 2020-05-01 MED ORDER — LACTATED RINGERS IV SOLN: INTRAVENOUS | Status: DC

## 2020-05-01 MED ORDER — ONDANSETRON HCL 4 MG/2ML IJ SOLN
INTRAMUSCULAR | Status: DC | PRN
  Administered 2020-05-01: 4 mg via INTRAVENOUS

## 2020-05-01 MED ORDER — SODIUM BICARBONATE 4.2 % IV SOLN
INTRAVENOUS | Status: AC
  Filled 2020-05-01: qty 10

## 2020-05-01 SURGICAL SUPPLY — 46 items
ADH SKN CLS APL DERMABOND .7 (GAUZE/BANDAGES/DRESSINGS) ×2
APL PRP STRL LF DISP 70% ISPRP (MISCELLANEOUS) ×1
APPLIER CLIP 9.375 MED OPEN (MISCELLANEOUS)
APR CLP MED 9.3 20 MLT OPN (MISCELLANEOUS)
BLADE SURG 15 STRL LF DISP TIS (BLADE) ×1 IMPLANT
BLADE SURG 15 STRL SS (BLADE) ×3
CANISTER SUC SOCK COL 7IN (MISCELLANEOUS) IMPLANT
CANISTER SUCT 1200ML W/VALVE (MISCELLANEOUS) IMPLANT
CHLORAPREP W/TINT 26 (MISCELLANEOUS) ×3 IMPLANT
CLIP APPLIE 9.375 MED OPEN (MISCELLANEOUS) IMPLANT
COVER BACK TABLE 60X90IN (DRAPES) ×3 IMPLANT
COVER MAYO STAND STRL (DRAPES) ×3 IMPLANT
COVER PROBE W GEL 5X96 (DRAPES) ×3 IMPLANT
COVER WAND RF STERILE (DRAPES) IMPLANT
DECANTER SPIKE VIAL GLASS SM (MISCELLANEOUS) IMPLANT
DERMABOND ADVANCED (GAUZE/BANDAGES/DRESSINGS) ×4
DERMABOND ADVANCED .7 DNX12 (GAUZE/BANDAGES/DRESSINGS) ×2 IMPLANT
DRAPE LAPAROSCOPIC ABDOMINAL (DRAPES) ×3 IMPLANT
DRAPE UTILITY XL STRL (DRAPES) ×3 IMPLANT
ELECT COATED BLADE 2.86 ST (ELECTRODE) ×3 IMPLANT
ELECT REM PT RETURN 9FT ADLT (ELECTROSURGICAL) ×3
ELECTRODE REM PT RTRN 9FT ADLT (ELECTROSURGICAL) ×1 IMPLANT
GLOVE BIO SURGEON STRL SZ7.5 (GLOVE) ×6 IMPLANT
GLOVE BIOGEL PI IND STRL 7.0 (GLOVE) ×2 IMPLANT
GLOVE BIOGEL PI INDICATOR 7.0 (GLOVE) ×4
GLOVE ECLIPSE 6.5 STRL STRAW (GLOVE) ×3 IMPLANT
GOWN STRL REUS W/ TWL LRG LVL3 (GOWN DISPOSABLE) ×2 IMPLANT
GOWN STRL REUS W/TWL LRG LVL3 (GOWN DISPOSABLE) ×6
ILLUMINATOR WAVEGUIDE N/F (MISCELLANEOUS) IMPLANT
KIT MARKER MARGIN INK (KITS) ×3 IMPLANT
LIGHT WAVEGUIDE WIDE FLAT (MISCELLANEOUS) IMPLANT
NEEDLE HYPO 25X1 1.5 SAFETY (NEEDLE) ×3 IMPLANT
NS IRRIG 1000ML POUR BTL (IV SOLUTION) ×3 IMPLANT
PACK BASIN DAY SURGERY FS (CUSTOM PROCEDURE TRAY) ×3 IMPLANT
PENCIL SMOKE EVACUATOR (MISCELLANEOUS) ×3 IMPLANT
SLEEVE SCD COMPRESS KNEE MED (MISCELLANEOUS) ×3 IMPLANT
SPONGE LAP 18X18 RF (DISPOSABLE) ×3 IMPLANT
SUT MON AB 4-0 PC3 18 (SUTURE) ×6 IMPLANT
SUT SILK 2 0 SH (SUTURE) IMPLANT
SUT VICRYL 3-0 CR8 SH (SUTURE) ×3 IMPLANT
SYR CONTROL 10ML LL (SYRINGE) ×3 IMPLANT
TOWEL GREEN STERILE FF (TOWEL DISPOSABLE) ×3 IMPLANT
TRAY FAXITRON CT DISP (TRAY / TRAY PROCEDURE) ×6 IMPLANT
TUBE CONNECTING 20'X1/4 (TUBING)
TUBE CONNECTING 20X1/4 (TUBING) IMPLANT
YANKAUER SUCT BULB TIP NO VENT (SUCTIONS) IMPLANT

## 2020-05-01 NOTE — Interval H&P Note (Signed)
History and Physical Interval Note:  05/01/2020 7:53 AM  April Hudson  has presented today for surgery, with the diagnosis of right breast atypical lobular hyperplasia.  The various methods of treatment have been discussed with the patient and family. After consideration of risks, benefits and other options for treatment, the patient has consented to  Procedure(s): RIGHT BREAST LUMPECTOMY X 2 WITH RADIOACTIVE SEED LOCALIZATION as a surgical intervention.  The patient's history has been reviewed, patient examined, no change in status, stable for surgery.  I have reviewed the patient's chart and labs.  Questions were answered to the patient's satisfaction.     Autumn Messing III

## 2020-05-01 NOTE — Transfer of Care (Signed)
Immediate Anesthesia Transfer of Care Note  Patient: April Hudson  Procedure(s) Performed: LEFT BREAST LUMPECTOMY X 2 WITH RADIOACTIVE SEED LOCALIZATION (Left Breast)  Patient Location: PACU  Anesthesia Type:General  Level of Consciousness: awake, alert  and oriented  Airway & Oxygen Therapy: Patient Spontanous Breathing and Patient connected to face mask oxygen  Post-op Assessment: Report given to RN and Post -op Vital signs reviewed and stable  Post vital signs: Reviewed and stable  Last Vitals:  Vitals Value Taken Time  BP 139/97 05/01/20 0952  Temp 36.5 C 05/01/20 0952  Pulse 87 05/01/20 0958  Resp 10 05/01/20 0958  SpO2 100 % 05/01/20 0958  Vitals shown include unvalidated device data.  Last Pain:  Vitals:   05/01/20 0952  TempSrc:   PainSc: (P) Asleep      Patients Stated Pain Goal: 3 (83/72/90 2111)  Complications: No complications documented.

## 2020-05-01 NOTE — Anesthesia Preprocedure Evaluation (Addendum)
Anesthesia Evaluation  Patient identified by MRN, date of birth, ID band Patient awake    Reviewed: Allergy & Precautions, NPO status , Patient's Chart, lab work & pertinent test results  Airway Mallampati: II  TM Distance: >3 FB Neck ROM: Full    Dental  (+) Dental Advisory Given   Pulmonary neg pulmonary ROS, Current Smoker,    breath sounds clear to auscultation       Cardiovascular negative cardio ROS   Rhythm:Regular     Neuro/Psych  Headaches,  Neuromuscular disease negative psych ROS   GI/Hepatic Neg liver ROS, GERD  Controlled,  Endo/Other  negative endocrine ROS  Renal/GU negative Renal ROS     Musculoskeletal  (+) Arthritis ,   Abdominal   Peds  Hematology negative hematology ROS (+)   Anesthesia Other Findings   Reproductive/Obstetrics hysterectomy                            Anesthesia Physical Anesthesia Plan  ASA: II  Anesthesia Plan: General   Post-op Pain Management:    Induction: Intravenous  PONV Risk Score and Plan: 2 and Ondansetron, Dexamethasone, Propofol infusion and TIVA  Airway Management Planned: LMA  Additional Equipment: None  Intra-op Plan:   Post-operative Plan: Extubation in OR  Informed Consent: I have reviewed the patients History and Physical, chart, labs and discussed the procedure including the risks, benefits and alternatives for the proposed anesthesia with the patient or authorized representative who has indicated his/her understanding and acceptance.     Dental advisory given  Plan Discussed with: CRNA and Surgeon  Anesthesia Plan Comments:         Anesthesia Quick Evaluation

## 2020-05-01 NOTE — H&P (Signed)
April Hudson  Location: [facility] Patient #: [id number] DOB: 11/30/1970 Married / Language: English / Race: Black or African American Female   History of Present Illness  The patient is a 49 year old female who presents with a breast mass. We are asked to see the patient in consultation by Dr. Geryl Rankins to evaluate her for atypical lobular hyperplasia of the right breast. The patient is a 49 year old black female who recently went for a routine screening mammogram. At that time she was reporting some significant tenderness in the left breast only. She denies any discharge from the nipple. She has no family history of breast cancer. She was found to have 2 areas of abnormality in the right breast. These were both biopsied and came back as atypical lobular hyperplasia.   Past Surgical History  Breast Biopsy  Bilateral. Colon Polyp Removal - Colonoscopy  Hemorrhoidectomy  Hysterectomy (due to cancer) - Complete  Knee Surgery  Left.  Diagnostic Studies History  Colonoscopy  1-5 years ago Mammogram  within last year Pap Smear  >5 years ago  Allergies  Sulfur  Allergies Reconciled   Medication History Benzonatate (100MG  Capsule, Oral) Active. Ondansetron HCl (4MG  Tablet, Oral) Active. Cyclobenzaprine HCl (5MG  Tablet, Oral) Active. Medications Reconciled  Social History  Alcohol use  Occasional alcohol use. Caffeine use  Coffee, Tea. No drug use  Tobacco use  Former smoker.  Family History  Alcohol Abuse  Father. Arthritis  Brother, Mother, Sister. Cerebrovascular Accident  Mother. Colon Polyps  Mother. Diabetes Mellitus  Mother. Heart Disease  Mother. Heart disease in female family member before age 70  Hypertension  Mother.  Pregnancy / Birth History  Age at menarche  74 years. Age of menopause  <43 Gravida  0 Maternal age  91-20 Para  2 Regular periods   Other Problems  Arthritis  Gastric Ulcer   Gastroesophageal Reflux Disease  Migraine Headache     Review of Systems General Present- Night Sweats. Not Present- Appetite Loss, Chills, Fatigue, Fever, Weight Gain and Weight Loss. Skin Not Present- Change in Wart/Mole, Dryness, Hives, Jaundice, New Lesions, Non-Healing Wounds, Rash and Ulcer. HEENT Not Present- Earache, Hearing Loss, Hoarseness, Nose Bleed, Oral Ulcers, Ringing in the Ears, Seasonal Allergies, Sinus Pain, Sore Throat, Visual Disturbances, Wears glasses/contact lenses and Yellow Eyes. Respiratory Present- Chronic Cough. Not Present- Bloody sputum, Difficulty Breathing, Snoring and Wheezing. Breast Present- Breast Mass. Not Present- Breast Pain, Nipple Discharge and Skin Changes. Cardiovascular Not Present- Chest Pain, Difficulty Breathing Lying Down, Leg Cramps, Palpitations, Rapid Heart Rate, Shortness of Breath and Swelling of Extremities. Gastrointestinal Present- Difficulty Swallowing. Not Present- Abdominal Pain, Bloating, Bloody Stool, Change in Bowel Habits, Chronic diarrhea, Constipation, Excessive gas, Gets full quickly at meals, Hemorrhoids, Indigestion, Nausea, Rectal Pain and Vomiting. Female Genitourinary Not Present- Frequency, Nocturia, Painful Urination, Pelvic Pain and Urgency. Musculoskeletal Present- Swelling of Extremities. Not Present- Back Pain, Joint Pain, Joint Stiffness, Muscle Pain and Muscle Weakness. Neurological Present- Headaches, Numbness and Tingling. Not Present- Decreased Memory, Fainting, Seizures, Tremor, Trouble walking and Weakness. Psychiatric Not Present- Anxiety, Bipolar, Change in Sleep Pattern, Depression, Fearful and Frequent crying. Hematology Not Present- Blood Thinners, Easy Bruising, Excessive bleeding, Gland problems, HIV and Persistent Infections.  Vitals  Weight: 138 lb Height: 61in Body Surface Area: 1.61 m Body Mass Index: 26.07 kg/m  Temp.: 98.80F (Temporal)  Pulse: 96 (Regular)  P.OX: 98% (Room  air) BP: 110/68(Sitting, Left Arm, Standard)       Physical Exam  General Mental  Status-Alert. General Appearance-Consistent with stated age. Hydration-Well hydrated. Voice-Normal.  Head and Neck Head-normocephalic, atraumatic with no lesions or palpable masses. Trachea-midline. Thyroid Gland Characteristics - normal size and consistency.  Eye Eyeball - Bilateral-Extraocular movements intact. Sclera/Conjunctiva - Bilateral-No scleral icterus.  Chest and Lung Exam Chest and lung exam reveals -quiet, even and easy respiratory effort with no use of accessory muscles and on auscultation, normal breath sounds, no adventitious sounds and normal vocal resonance. Inspection Chest Wall - Normal. Back - normal.  Breast Note: There is no palpable mass in either breast. There is no palpable axillary, supraclavicular, or cervical lymphadenopathy.   Cardiovascular Cardiovascular examination reveals -normal heart sounds, regular rate and rhythm with no murmurs and normal pedal pulses bilaterally.  Abdomen Inspection Inspection of the abdomen reveals - No Hernias. Skin - Scar - no surgical scars. Palpation/Percussion Palpation and Percussion of the abdomen reveal - Soft, Non Tender, No Rebound tenderness, No Rigidity (guarding) and No hepatosplenomegaly. Auscultation Auscultation of the abdomen reveals - Bowel sounds normal.  Neurologic Neurologic evaluation reveals -alert and oriented x 3 with no impairment of recent or remote memory. Mental Status-Normal.  Musculoskeletal Normal Exam - Left-Upper Extremity Strength Normal and Lower Extremity Strength Normal. Normal Exam - Right-Upper Extremity Strength Normal and Lower Extremity Strength Normal.  Lymphatic Head & Neck  General Head & Neck Lymphatics: Bilateral - Description - Normal. Axillary  General Axillary Region: Bilateral - Description - Normal. Tenderness - Non Tender. Femoral &  Inguinal  Generalized Femoral & Inguinal Lymphatics: Bilateral - Description - Normal. Tenderness - Non Tender.    Assessment & Plan  ATYPICAL DUCTAL HYPERPLASIA OF LEFT BREAST (N60.92) Impression: The patient appears to have 2 areas of atypical lobular hyperplasia in the right breast. Because ALH can have an appearance very similar to DCIS and because [facility] carries a risk of breast cancer around 35% I would recommend that these 2 areas be removed to make sure we are not missing something more significant. I have discussed with her in detail the risks and benefits of the operation as well as some of the technical aspects including the use of a radioactive seed for localization and she understands and wishes to proceed. I will go ahead and refer her to the high-risk clinic at the cancer center to talk about risk reduction. She will also be a candidate for more intensive screening with MRI in addition to her mammogram yearly. This patient encounter took 60 minutes today to perform the following: take history, perform exam, review outside records, interpret imaging, counsel the patient on their diagnosis and document encounter, findings & plan in the EHR Current Plans Referred to Oncology, for evaluation and follow up (Oncology). Routine. Addendum Note This is a correction. The patient had 2 biopsies in the right breast that showed atypical lobular hyperplasia. Both of these areas should be removed because of their high-risk nature and because of the risk of missing something more significant. The left side is benign.

## 2020-05-01 NOTE — Discharge Instr - Supplementary Instructions (Signed)
Last dose of pain medication (oxycodone 5mg ) given at 1045. Next dose anytime after 4:45 today (Thursday)

## 2020-05-01 NOTE — Op Note (Signed)
05/01/2020  9:39 AM  PATIENT:  April Hudson  49 y.o. female  PRE-OPERATIVE DIAGNOSIS:  right breast atypical lobular hyperplasia  POST-OPERATIVE DIAGNOSIS:  Right breast atypical lobular hyperplasia  PROCEDURE:  Procedure(s): RIGHT BREAST LUMPECTOMY X 2 WITH RADIOACTIVE SEED LOCALIZATION   SURGEON:  Surgeon(s) and Role:    * Jovita Kussmaul, MD - Primary  PHYSICIAN ASSISTANT:   ASSISTANTS: none   ANESTHESIA:   local and general  EBL:  Minimal   BLOOD ADMINISTERED:none  DRAINS: none   LOCAL MEDICATIONS USED:  MARCAINE     SPECIMEN:  Source of Specimen:  right breast tissue x 2  DISPOSITION OF SPECIMEN:  PATHOLOGY  COUNTS:  YES  TOURNIQUET:  * No tourniquets in log *  DICTATION: .Dragon Dictation   After informed consent was obtained the patient was brought to the operating room and placed in the supine position on the operating table.  After adequate induction of general anesthesia the patient's right breast was prepped with ChloraPrep, allowed to dry, and draped in usual sterile manner.  An appropriate timeout was performed.  Previously to I-125 seeds were placed in the right breast to mark areas of atypical lobular hyperplasia.  The neoprobe was set to I-125 and the 2 areas of radioactivity were readily identified.  The first was in the lower outer central right breast.  The area around this was infiltrated with quarter percent Marcaine.  A curvilinear incision was made along the lower outer edge of the areola of the right breast with a 15 blade knife.  The incision was carried through the skin and subcutaneous tissue sharply with the electrocautery.  Dissection was then carried towards the radioactive seed under the direction of the neoprobe.  Once I more closely approached the radioactive seed I then removed a circular portion of breast tissue sharply with the electrocautery around the radioactive seed while checking the area of radioactivity frequently.  Once the  specimen was removed it was oriented with the appropriate paint colors.  A specimen radiograph was obtained that showed the clip and seed to be near the center of the specimen.  The specimen was then sent to pathology for further evaluation.  Hemostasis was achieved using the Bovie electrocautery.  The wound was infiltrated with more quarter percent Marcaine.  The deep layer of the wound was then closed with layers of interrupted 3-0 Vicryl stitches.  The skin was closed with interrupted 4-0 Monocryl subcuticular stitches.  Attention was then turned to the second area which was far in the upper outer quadrant of the right breast.  I elected to make a separate incision to get this out in the very upper outer lateral edge of the breast with a 15 blade knife.  The incision was then carried through the skin and subcutaneous tissue sharply with the electrocautery.  Dissection was then carried towards the radioactive seed under the direction of the neoprobe.  Once I more closely approached the radioactive seed I then removed a circular portion of breast tissue sharply with the electrocautery around the radioactive seed while checking the area of radioactivity frequently.  Once the specimen was removed it was oriented with the appropriate paint colors.  A specimen radiograph was obtained that showed the seed in the center of the specimen.  The clip was noted to have migrated away from the area and was no longer in an appropriate position.  The specimen was then sent to pathology for further evaluation.  Hemostasis was achieved using the  Bovie electrocautery.  The wound was infiltrated with more quarter percent Marcaine and irrigated with saline.  The deep layer of the wound was then closed with layers of interrupted 3-0 Vicryl stitches.  The skin was then closed with a running 4-0 Monocryl subcuticular stitch.  Dermabond dressings were applied.  The patient tolerated the procedure well.  At the end of the case all needle  sponge and instrument counts were correct.  The patient was then awakened and taken to recovery in stable condition.  PLAN OF CARE: Discharge to home after PACU  PATIENT DISPOSITION:  PACU - hemodynamically stable.   Delay start of Pharmacological VTE agent (>24hrs) due to surgical blood loss or risk of bleeding: not applicable

## 2020-05-01 NOTE — Discharge Instructions (Signed)
  Post Anesthesia Home Care Instructions  Activity: Get plenty of rest for the remainder of the day. A responsible individual must stay with you for 24 hours following the procedure.  For the next 24 hours, DO NOT: -Drive a car -Paediatric nurse -Drink alcoholic beverages -Take any medication unless instructed by your physician -Make any legal decisions or sign important papers.  Meals: Start with liquid foods such as gelatin or soup. Progress to regular foods as tolerated. Avoid greasy, spicy, heavy foods. If nausea and/or vomiting occur, drink only clear liquids until the nausea and/or vomiting subsides. Call your physician if vomiting continues.  Special Instructions/Symptoms: Your throat may feel dry or sore from the anesthesia or the breathing tube placed in your throat during surgery. If this causes discomfort, gargle with warm salt water. The discomfort should disappear within 24 hours.  Next dose of Tylenol can be given at 2:00pm if needed. Next dose of Ibuprofen/Motrin can be given at 4:00pm if needed.

## 2020-05-01 NOTE — Anesthesia Procedure Notes (Signed)
Procedure Name: LMA Insertion Date/Time: 05/01/2020 8:36 AM Performed by: [place] Alexsandra Shontz C, CRNA Pre-anesthesia Checklist: Patient identified, Emergency Drugs available, Suction available and Patient being monitored Patient Re-evaluated:Patient Re-evaluated prior to induction Oxygen Delivery Method: Circle system utilized Preoxygenation: Pre-oxygenation with 100% oxygen Induction Type: IV induction Ventilation: Mask ventilation without difficulty LMA: LMA inserted LMA Size: 4.0 Number of attempts: 1 Airway Equipment and Method: Bite block Placement Confirmation: positive ETCO2 Tube secured with: Tape Dental Injury: Teeth and Oropharynx as per pre-operative assessment

## 2020-05-01 NOTE — Anesthesia Postprocedure Evaluation (Signed)
Anesthesia Post Note  Patient: [name]  Procedure(s) Performed: LEFT BREAST LUMPECTOMY X 2 WITH RADIOACTIVE SEED LOCALIZATION (Left Breast)     Patient location during evaluation: PACU Anesthesia Type: General Level of consciousness: awake and alert Pain management: pain level controlled Vital Signs Assessment: post-procedure vital signs reviewed and stable Respiratory status: spontaneous breathing, nonlabored ventilation, respiratory function stable and patient connected to nasal cannula oxygen Cardiovascular status: blood pressure returned to baseline and stable Postop Assessment: no apparent nausea or vomiting Anesthetic complications: no   No complications documented.  Last Vitals:  Vitals:   05/01/20 1045 05/01/20 1115  BP:  (!) 146/94  Pulse:  82  Resp:  16  Temp:  (!) 36.2 C  SpO2: 98% 98%    Last Pain:  Vitals:   05/01/20 1115  TempSrc:   PainSc: 2                  Calbert Hulsebus

## 2020-05-02 ENCOUNTER — Encounter (HOSPITAL_BASED_OUTPATIENT_CLINIC_OR_DEPARTMENT_OTHER): Payer: Self-pay | Admitting: General Surgery

## 2020-05-02 LAB — SURGICAL PATHOLOGY

## 2020-05-02 NOTE — Addendum Note (Signed)
Addendum  created 05/02/20 2409 by Tawni Millers, CRNA   Charge Capture section accepted

## 2020-05-23 ENCOUNTER — Telehealth: Payer: Self-pay | Admitting: Hematology and Oncology

## 2020-05-23 NOTE — Telephone Encounter (Signed)
Received a new pt referral from Dr. Marlou Starks at [facility] for the high risk clinic. Pt has been cld and scheduled to see Dr. Lindi Adie on 9/15 at 10:15am. Pt aware to arrive 15 minutes early.

## 2020-06-17 NOTE — Progress Notes (Signed)
[facility] NOTE  Patient Care Team: [name], NP as PCP - General (Nurse Practitioner)  CHIEF COMPLAINTS/PURPOSE OF CONSULTATION:  Newly diagnosed high risk for breast cancer  HISTORY OF PRESENTING ILLNESS:  April Hudson 49 y.o. female is here because of recent diagnosis of high risk for breast cancer. Screening mammogram on 01/11/20 showed right breast calcifications, 0.6cm, and a left breast mass at the 11:30 position, 0.7cm. Biopsy on 02/06/20 showed atypical lobular hyperplasia and calcifications in the right breast, and in the left breast, fibrocystic changes and no evidence of malignancy. She underwent a right lumpectomy on 05/01/20 with Dr. Marlou Starks for which pathology showed atypical lobular hyperplasia. She presents to the clinic today for initial evaluation and discussion of surveillance options.   I reviewed her records extensively and collaborated the history with the patient.  MEDICAL HISTORY:  Past Medical History:  Diagnosis Date  . Carpal tunnel syndrome of right wrist   . Ganglion cyst of wrist, right   . GERD (gastroesophageal reflux disease)   . History of adenomatous polyp of colon    tubular adenoma's 09/ 2016;  06/ 2017  . History of Clostridium difficile colitis 05/21/2016  . History of frequent urinary tract infections   . History of gastric ulcer    clinical diagnosis, no prior EGD  . History of uterine fibroid   . Left breast mass   . Migraines   . OA (osteoarthritis)   . Smoker    1ppd    SURGICAL HISTORY: Past Surgical History:  Procedure Laterality Date  . BREAST LUMPECTOMY WITH RADIOACTIVE SEED LOCALIZATION Left 05/01/2020   Procedure: LEFT BREAST LUMPECTOMY X 2 WITH RADIOACTIVE SEED LOCALIZATION;  Surgeon: Jovita Kussmaul, MD;  Location: [facility];  Service: General;  Laterality: Left;  . CARPAL TUNNEL RELEASE Right 01/13/2017   Procedure: CARPAL TUNNEL RELEASE;  Surgeon: Iran Planas, MD;  Location:  [facility];  Service: Orthopedics;  Laterality: Right;  . COLONOSCOPY  last one 01/ 03/ 2018   sigmoid scope  . GANGLION CYST EXCISION Right 01/13/2017   Procedure: REMOVAL GANGLION OF WRIST;  Surgeon: Iran Planas, MD;  Location: [facility];  Service: Orthopedics;  Laterality: Right;  . KNEE ARTHROSCOPY Left ~ 2015  . VAGINAL HYSTERECTOMY  2006    SOCIAL HISTORY: Social History   Socioeconomic History  . Marital status: Married    Spouse name: Not on file  . Number of children: 2  . Years of education: Not on file  . Highest education level: GED or equivalent  Occupational History  . Occupation: unemployed    Comment: Agricultural engineer  Tobacco Use  . Smoking status: Current Every Day Smoker    Packs/day: 1.00    Years: 29.00    Pack years: 29.00    Types: Cigarettes  . Smokeless tobacco: Never Used  Vaping Use  . Vaping Use: Never used  Substance and Sexual Activity  . Alcohol use: No    Alcohol/week: 0.0 standard drinks  . Drug use: No  . Sexual activity: Yes    Birth control/protection: Surgical  Other Topics Concern  . Not on file  Social History Narrative   Lives with husband in a one story home.     Has 6 children (2 biological).     Works at [employer]..  Education: GED   Social Determinants of Health   Financial Resource Strain:   . Difficulty of Paying Living Expenses: Not on  file  Food Insecurity:   . Worried About Charity fundraiser in the Last Year: Not on file  . Ran Out of Food in the Last Year: Not on file  Transportation Needs:   . Lack of Transportation (Medical): Not on file  . Lack of Transportation (Non-Medical): Not on file  Physical Activity:   . Days of Exercise per Week: Not on file  . Minutes of Exercise per Session: Not on file  Stress:   . Feeling of Stress : Not on file  Social Connections:   . Frequency of Communication with Friends and Family: Not on file  . Frequency of Social Gatherings with  Friends and Family: Not on file  . Attends Religious Services: Not on file  . Active Member of Clubs or Organizations: Not on file  . Attends Archivist Meetings: Not on file  . Marital Status: Not on file  Intimate Partner Violence:   . Fear of Current or Ex-Partner: Not on file  . Emotionally Abused: Not on file  . Physically Abused: Not on file  . Sexually Abused: Not on file    FAMILY HISTORY: Family History  Problem Relation Age of Onset  . Diabetes Mother   . Hypertension Mother   . Ovarian cancer Mother   . Stomach cancer Maternal Uncle   . Heart disease Sister   . Healthy Brother   . Asthma Son   . Asthma Daughter   . Colon cancer Maternal Uncle 64  . Colon polyps Neg Hx   . Esophageal cancer Neg Hx   . Rectal cancer Neg Hx     ALLERGIES:  is allergic to sulfa antibiotics.  MEDICATIONS:  Current Outpatient Medications  Medication Sig Dispense Refill  . HYDROcodone-acetaminophen (NORCO/VICODIN) 5-325 MG tablet Take 1-2 tablets by mouth every 6 (six) hours as needed for moderate pain or severe pain. 15 tablet 0   No current facility-administered medications for this visit.    REVIEW OF SYSTEMS:     All other systems were reviewed with the patient and are negative.  PHYSICAL EXAMINATION: ECOG PERFORMANCE STATUS: 1 - Symptomatic but completely ambulatory  There were no vitals filed for this visit. There were no vitals filed for this visit.    LABORATORY DATA:  I have reviewed the data as listed Lab Results  Component Value Date   WBC 6.0 01/24/2020   HGB 15.9 (H) 01/24/2020   HCT 46.4 (H) 01/24/2020   MCV 98.7 01/24/2020   PLT 196.0 01/24/2020   Lab Results  Component Value Date   NA 144 12/18/2019   K 4.1 12/18/2019   CL 106 12/18/2019   CO2 24 12/18/2019    RADIOGRAPHIC STUDIES: I have personally reviewed the radiological reports and agreed with the findings in the report.  ASSESSMENT AND PLAN:  Atypical lobular hyperplasia (ALH)  of right breast Screening mammogram on 01/11/20 showed right breast calcifications, 0.6cm, and a left breast mass at the 11:30 position, 0.7cm.  Biopsy on 02/06/20 showed atypical lobular hyperplasia and calcifications in the right breast, and in the left breast, fibrocystic changes and no evidence of malignancy.  Right lumpectomy on 05/01/20 with Dr. Marlou Starks: Atypical lobular hyperplasia.  Atypical lobular hyperplasia: This is characterized by abnormal cells that are filling part of the lobule.  It appears to increase the risk of breast cancer by 3.75.3 fold.  There is risk of both ipsilateral and contralateral breast cancers.  The cumulative incidence of breast cancer is approximately 1 %/year.  Tyrer Cusick 10-year risk 11% and lifetime risk 35%  Risk reduction strategies: I recommended appropriate lifestyle and dietary changes that include exercise, eating less red meat and increasing fruits and vegetables. Risk reduction with tamoxifen or raloxifene would reduce the risk by half.  Tamoxifen counseling: We discussed the risks and benefits of tamoxifen. These include but not limited to insomnia, hot flashes, mood changes, vaginal dryness, and weight gain. Although rare, serious side effects including endometrial cancer, risk of blood clots were also discussed. We strongly believe that the benefits far outweigh the risks. Patient understands these risks and consented to starting treatment. Planned treatment duration is 5 years.  Breast cancer surveillance: Annual mammograms, consider breast MRI because a lifetime risk of breast cancer risk of more than 20% along with breast exams.  Return to clinic in 3 months for toxicity check on tamoxifen    All questions were answered. The patient knows to call the clinic with any problems, questions or concerns.   Rulon Eisenmenger, MD, MPH 06/18/2020    I, Molly Dorshimer, am acting as scribe for Nicholas Lose, MD.  I have reviewed the above documentation for  accuracy and completeness, and I agree with the above.

## 2020-06-18 ENCOUNTER — Other Ambulatory Visit: Payer: Self-pay

## 2020-06-18 ENCOUNTER — Inpatient Hospital Stay: Payer: 59 | Attending: Hematology and Oncology | Admitting: Hematology and Oncology

## 2020-06-18 VITALS — BP 115/81 | HR 89 | Temp 97.2°F | Resp 18 | Ht 61.0 in | Wt 140.0 lb

## 2020-06-18 DIAGNOSIS — Z808 Family history of malignant neoplasm of other organs or systems: Secondary | ICD-10-CM | POA: Diagnosis not present

## 2020-06-18 DIAGNOSIS — Z9189 Other specified personal risk factors, not elsewhere classified: Secondary | ICD-10-CM | POA: Diagnosis not present

## 2020-06-18 DIAGNOSIS — Z800 Family history of malignant neoplasm of digestive organs: Secondary | ICD-10-CM | POA: Insufficient documentation

## 2020-06-18 DIAGNOSIS — Z8049 Family history of malignant neoplasm of other genital organs: Secondary | ICD-10-CM | POA: Insufficient documentation

## 2020-06-18 DIAGNOSIS — N6091 Unspecified benign mammary dysplasia of right breast: Secondary | ICD-10-CM | POA: Diagnosis not present

## 2020-06-18 DIAGNOSIS — Z86010 Personal history of colonic polyps: Secondary | ICD-10-CM | POA: Insufficient documentation

## 2020-06-18 DIAGNOSIS — F17210 Nicotine dependence, cigarettes, uncomplicated: Secondary | ICD-10-CM | POA: Diagnosis not present

## 2020-06-18 MED ORDER — TAMOXIFEN CITRATE 20 MG PO TABS
20.0000 mg | ORAL_TABLET | Freq: Every day | ORAL | 3 refills | Status: DC
Start: 2020-06-18 — End: 2021-08-10

## 2020-06-18 NOTE — Assessment & Plan Note (Addendum)
Screening mammogram on 01/11/20 showed right breast calcifications, 0.6cm, and a left breast mass at the 11:30 position, 0.7cm.  Biopsy on 02/06/20 showed atypical lobular hyperplasia and calcifications in the right breast, and in the left breast, fibrocystic changes and no evidence of malignancy.  Right lumpectomy on 05/01/20 with Dr. Marlou Starks: Atypical lobular hyperplasia.  Atypical lobular hyperplasia: This is characterized by abnormal cells that are filling part of the lobule.  It appears to increase the risk of breast cancer by 3.75.3 fold.  There is risk of both ipsilateral and contralateral breast cancers.  The cumulative incidence of breast cancer is approximately 1 %/year.  Risk reduction strategies: I recommended appropriate lifestyle and dietary changes that include exercise, eating less red meat and increasing fruits and vegetables. Risk reduction with tamoxifen or raloxifene would reduce the risk by half.  Tamoxifen counseling: We discussed the risks and benefits of tamoxifen. These include but not limited to insomnia, hot flashes, mood changes, vaginal dryness, and weight gain. Although rare, serious side effects including endometrial cancer, risk of blood clots were also discussed. We strongly believe that the benefits far outweigh the risks. Patient understands these risks and consented to starting treatment. Planned treatment duration is 5 years.  Breast cancer surveillance: Annual mammograms, consider breast MRI because a lifetime risk of breast cancer risk of more than 20% along with breast exams.  Return to clinic in 3 months for toxicity check on tamoxifen

## 2020-09-16 NOTE — Progress Notes (Signed)
Patient Care Team: [name], NP as PCP - General (Nurse Practitioner)  DIAGNOSIS:    ICD-10-CM   1. Atypical lobular hyperplasia (ALH) of right breast  N60.91 US BREAST LTD UNI RIGHT INC AXILLA    MM DIAG BREAST TOMO UNI RIGHT  2. Neck pain on right side  M54.2 gabapentin (NEURONTIN) 300 MG capsule    CHIEF COMPLIANT: Follow-up of high risk for breast cancer on tamoxifen  INTERVAL HISTORY: April Hudson is a 49 y.o. with above-mentioned history of high risk for breast for breast cancer currently on risk reduction with tamoxifen. She presents to the clinic today for follow-up.  Her major complaint is pain in the right breast it is sharp in nature in the lower outer quadrant along the surgical scar and there are no aggravating or relieving factors.  She has had this pain for about a month.  Previously she had postsurgical pain for which she took pain medication as well as gabapentin.  She is out of both of these medicines.  ALLERGIES:  is allergic to sulfa antibiotics.  MEDICATIONS:  Current Outpatient Medications  Medication Sig Dispense Refill  . gabapentin (NEURONTIN) 300 MG capsule Take 1 capsule (300 mg total) by mouth 3 (three) times daily. 90 capsule 6  . tamoxifen (NOLVADEX) 20 MG tablet Take 1 tablet (20 mg total) by mouth daily. 90 tablet 3   No current facility-administered medications for this visit.    PHYSICAL EXAMINATION: ECOG PERFORMANCE STATUS: 1 - Symptomatic but completely ambulatory  Vitals:   09/17/20 0920  BP: 113/68  Pulse: 94  Resp: 17  Temp: 98.4 F (36.9 C)  SpO2: 99%   Filed Weights   09/17/20 0920  Weight: 153 lb 3.2 oz (69.5 kg)    BREAST: No palpable masses or nodules in either right or left breasts. No palpable axillary supraclavicular or infraclavicular adenopathy no breast tenderness or nipple discharge.  There is tenderness in the right breast in the lower outer quadrant.  (exam performed in the presence of a  chaperone)  LABORATORY DATA:  I have reviewed the data as listed CMP Latest Ref Rng & Units 12/18/2019 12/02/2019 12/23/2018  Glucose 65 - 99 mg/dL 87 89 114(H)  BUN 6 - 24 mg/dL 7 <5(L) 9  Creatinine 0.57 - 1.00 mg/dL 0.83 0.66 0.98  Sodium 134 - 144 mmol/L 144 142 138  Potassium 3.5 - 5.2 mmol/L 4.1 3.5 3.4(L)  Chloride 96 - 106 mmol/L 106 108 103  CO2 20 - 29 mmol/L 24 22 26   Calcium 8.7 - 10.2 mg/dL 9.4 9.4 9.4  Total Protein 6.0 - 8.5 g/dL 6.7 - -  Total Bilirubin 0.0 - 1.2 mg/dL 0.3 - -  Alkaline Phos 39 - 117 IU/L 64 - -  AST 0 - 40 IU/L 18 - -  ALT 0 - 32 IU/L 10 - -    Lab Results  Component Value Date   WBC 6.0 01/24/2020   HGB 15.9 (H) 01/24/2020   HCT 46.4 (H) 01/24/2020   MCV 98.7 01/24/2020   PLT 196.0 01/24/2020   NEUTROABS 3.0 01/24/2020    ASSESSMENT & PLAN:  Atypical lobular hyperplasia (ALH) of right breast Screening mammogram on 01/11/20 showed right breast calcifications, 0.6cm, and a left breast mass at the 11:30 position, 0.7cm.  Biopsy on 02/06/20 showed atypical lobular hyperplasia and calcifications in the right breast, and in the left breast, fibrocystic changes and no evidence of malignancy.  Right lumpectomy on 05/01/20 with Dr. Marlou Starks: Atypical  lobular hyperplasia.  Atypical lobular hyperplasia: This is characterized by abnormal cells that are filling part of the lobule.  It appears to increase the risk of breast cancer by 3.75.3 fold.  There is risk of both ipsilateral and contralateral breast cancers.  The cumulative incidence of breast cancer is approximately 1 %/year.  Tyrer Cusick 10-year risk 11% and lifetime risk 35%  Risk Reduction: Tamoxifen  Tamoxifen Toxicities: Moderate to severe hot flashes: I encouraged her to take the tamoxifen at bedtime.  Right breast pain and discomfort: We will order a mammogram and ultrasound of the breast center.  I sent a prescription for gabapentin 300 mg p.o. 3 times daily.  Breast Cancer Surveillance: Annual  mammograms, consider breast MRI because a lifetime risk of breast cancer risk of more than 20% along with breast exams  Follow-up in 1 month to assess her pain issues and see how she is doing.   Orders Placed This Encounter  Procedures  . US BREAST LTD UNI RIGHT INC AXILLA    Standing Status:   Future    Standing Expiration Date:   09/17/2021    Order Specific Question:   Reason for Exam (SYMPTOM  OR DIAGNOSIS REQUIRED)    Answer:   Right breast pain    Order Specific Question:   Preferred imaging location?    Answer:   [facility]    Order Specific Question:   Release to patient    Answer:   Immediate  . MM DIAG BREAST TOMO UNI RIGHT    Standing Status:   Future    Standing Expiration Date:   09/17/2021    Order Specific Question:   Reason for Exam (SYMPTOM  OR DIAGNOSIS REQUIRED)    Answer:   Right breast pain    Order Specific Question:   Is the patient pregnant?    Answer:   No    Order Specific Question:   Preferred imaging location?    Answer:   [facility]    Order Specific Question:   Release to patient    Answer:   Immediate   The patient has a good understanding of the overall plan. she agrees with it. she will call with any problems that may develop before the next visit here.  Total time spent: 30 mins including face to face time and time spent for planning, charting and coordination of care  Nicholas Lose, MD 09/17/2020  I, Cloyde Reams Dorshimer, am acting as scribe for Dr. Nicholas Lose.  I have reviewed the above documentation for accuracy and completeness, and I agree with the above.

## 2020-09-17 ENCOUNTER — Inpatient Hospital Stay: Payer: 59 | Attending: Hematology and Oncology | Admitting: Hematology and Oncology

## 2020-09-17 ENCOUNTER — Other Ambulatory Visit: Payer: Self-pay

## 2020-09-17 ENCOUNTER — Telehealth: Payer: Self-pay | Admitting: Hematology and Oncology

## 2020-09-17 DIAGNOSIS — M542 Cervicalgia: Secondary | ICD-10-CM

## 2020-09-17 DIAGNOSIS — Z8049 Family history of malignant neoplasm of other genital organs: Secondary | ICD-10-CM | POA: Insufficient documentation

## 2020-09-17 DIAGNOSIS — Z86010 Personal history of colonic polyps: Secondary | ICD-10-CM | POA: Diagnosis not present

## 2020-09-17 DIAGNOSIS — Z9189 Other specified personal risk factors, not elsewhere classified: Secondary | ICD-10-CM | POA: Insufficient documentation

## 2020-09-17 DIAGNOSIS — F17210 Nicotine dependence, cigarettes, uncomplicated: Secondary | ICD-10-CM | POA: Insufficient documentation

## 2020-09-17 DIAGNOSIS — N6091 Unspecified benign mammary dysplasia of right breast: Secondary | ICD-10-CM | POA: Diagnosis present

## 2020-09-17 DIAGNOSIS — Z800 Family history of malignant neoplasm of digestive organs: Secondary | ICD-10-CM | POA: Insufficient documentation

## 2020-09-17 DIAGNOSIS — Z808 Family history of malignant neoplasm of other organs or systems: Secondary | ICD-10-CM | POA: Diagnosis not present

## 2020-09-17 MED ORDER — GABAPENTIN 300 MG PO CAPS: 300.0000 mg | ORAL_CAPSULE | Freq: Three times a day (TID) | ORAL | 6 refills | Status: DC

## 2020-09-17 NOTE — Assessment & Plan Note (Signed)
Screening mammogram on 01/11/20 showed right breast calcifications, 0.6cm, and a left breast mass at the 11:30 position, 0.7cm.  Biopsy on 02/06/20 showed atypical lobular hyperplasia and calcifications in the right breast, and in the left breast, fibrocystic changes and no evidence of malignancy.  Right lumpectomy on 05/01/20 with Dr. Marlou Starks: Atypical lobular hyperplasia.  Atypical lobular hyperplasia: This is characterized by abnormal cells that are filling part of the lobule.  It appears to increase the risk of breast cancer by 3.75.3 fold.  There is risk of both ipsilateral and contralateral breast cancers.  The cumulative incidence of breast cancer is approximately 1 %/year.  Tyrer Cusick 10-year risk 11% and lifetime risk 35%  Risk Reduction: Tamoxifen  Tamoxifen Toxicities:  Breast Cancer Surveillance: Annual mammograms, consider breast MRI because a lifetime risk of breast cancer risk of more than 20% along with breast exams

## 2020-09-17 NOTE — Telephone Encounter (Signed)
Scheduled appt per 12/15 los. Gave pt a print out of AVS.

## 2020-09-22 ENCOUNTER — Ambulatory Visit: Payer: Self-pay | Admitting: *Deleted

## 2020-09-22 NOTE — Telephone Encounter (Signed)
Pt called in c/o her left hand being swollen and very painful for the last month.  Also  C/o her fingertips feeling numb/tingly. She had the same problem in her right hand and Geryl Rankins sent her to a specialist Dr. Edison Nasuti.  She had carpel tunnel and he did surgery on her right hand.   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  "I lost my job and insurance so I don't have a way to pay  It".  "I've been dealing with this bad pain for a month or so."    She is taking Tylenol without relief.  There were no openings at [facility] so I have referred her to the urgent care center.   She is agreeable to this plan.      Reason for Disposition . Numbness (i.e., loss of sensation) in hand or fingers (Exception: just tingling; numbness present > 2 weeks)  Answer Assessment - Initial Assessment Questions 1. ONSET: "When did the pain start?"     Left hand is swollen for a month.   It hurts and my fingers are tingling.  Dr. Raul Del sent me to Dr. Zoe Lan and I had surgery on right hand. 2. LOCATION: "Where is the pain located?"     Left hand.   3. PAIN: "How bad is the pain?" (Scale 1-10; or mild, moderate, severe)   - MILD (1-3): doesn't interfere with normal activities   - MODERATE (4-7): interferes with normal activities (e.g., work or school) or awakens from sleep   - SEVERE (8-10): excruciating pain, unable to use hand at all     8 on scale sometimes a 10  I'm using Tylenol without relief.   4. WORK OR EXERCISE: "Has there been any recent work or exercise that involved this part of the body?"     No  She lost her  job and insurance had to stop seeing the specialist.   Since she has an Mudlogger with the specialist he won't see her.   5. CAUSE: "What do you think is causing the pain?"     Carpel Tunnel 6. AGGRAVATING FACTORS: "What makes the pain worse?" (e.g., using computer)     Moving it around 7. OTHER SYMPTOMS: "Do you have any other  symptoms?" (e.g., neck pain, swelling, rash, numbness, fever)     Fingertips tingling, swelling and pain 8. PREGNANCY: "Is there any chance you are pregnant?" "When was your last menstrual period?"     Not asked  Protocols used: HAND AND WRIST PAIN-A-AH

## 2020-10-02 ENCOUNTER — Ambulatory Visit
Admission: RE | Admit: 2020-10-02 | Discharge: 2020-10-02 | Disposition: A | Discharge status: Home / Self Care | Payer: 59 | Source: Ambulatory Visit | Attending: Hematology and Oncology | Admitting: Hematology and Oncology

## 2020-10-02 ENCOUNTER — Ambulatory Visit: Payer: 59

## 2020-10-02 ENCOUNTER — Other Ambulatory Visit: Payer: Self-pay

## 2020-10-02 DIAGNOSIS — N6091 Unspecified benign mammary dysplasia of right breast: Secondary | ICD-10-CM

## 2020-10-14 NOTE — Progress Notes (Signed)
Patient Care Team: [name], NP as PCP - General (Nurse Practitioner)  DIAGNOSIS:    ICD-10-CM   1. Atypical lobular hyperplasia (ALH) of right breast  N60.91 MR BREAST BILATERAL W WO CONTRAST INC CAD    MM DIAG BREAST TOMO BILATERAL    CHIEF COMPLIANT: Follow-up of high risk for breast cancer on tamoxifen  INTERVAL HISTORY: Shadow April Hudson is a 50 y.o. with above-mentioned history of high risk for breast for breast cancer currently on risk reduction with tamoxifen. Mammogram on 10/02/20 after the patient experienced diffuse right breast pain showed no evidence of malignancy in the right breast. She presents to the clinic today for follow-up. The breast pain appears to be improving.  Gabapentin appears to be working for her.  ALLERGIES:  is allergic to sulfa antibiotics.  MEDICATIONS:  Current Outpatient Medications  Medication Sig Dispense Refill  . gabapentin (NEURONTIN) 300 MG capsule Take 1 capsule (300 mg total) by mouth 3 (three) times daily. 90 capsule 6  . tamoxifen (NOLVADEX) 20 MG tablet Take 1 tablet (20 mg total) by mouth daily. 90 tablet 3   No current facility-administered medications for this visit.    PHYSICAL EXAMINATION: ECOG PERFORMANCE STATUS: 1 - Symptomatic but completely ambulatory  Vitals:   10/15/20 1137  BP: 104/64  Pulse: 100  Resp: 18  Temp: 97.9 F (36.6 C)  SpO2: 98%   Filed Weights   10/15/20 1137  Weight: 160 lb 1.6 oz (72.6 kg)     LABORATORY DATA:  I have reviewed the data as listed CMP Latest Ref Rng & Units 12/18/2019 12/02/2019 12/23/2018  Glucose 65 - 99 mg/dL 87 89 114(H)  BUN 6 - 24 mg/dL 7 <5(L) 9  Creatinine 0.57 - 1.00 mg/dL 0.83 0.66 0.98  Sodium 134 - 144 mmol/L 144 142 138  Potassium 3.5 - 5.2 mmol/L 4.1 3.5 3.4(L)  Chloride 96 - 106 mmol/L 106 108 103  CO2 20 - 29 mmol/L 24 22 26   Calcium 8.7 - 10.2 mg/dL 9.4 9.4 9.4  Total Protein 6.0 - 8.5 g/dL 6.7 - -  Total Bilirubin 0.0 - 1.2 mg/dL 0.3 - -  Alkaline  Phos 39 - 117 IU/L 64 - -  AST 0 - 40 IU/L 18 - -  ALT 0 - 32 IU/L 10 - -    Lab Results  Component Value Date   WBC 6.0 01/24/2020   HGB 15.9 (H) 01/24/2020   HCT 46.4 (H) 01/24/2020   MCV 98.7 01/24/2020   PLT 196.0 01/24/2020   NEUTROABS 3.0 01/24/2020    ASSESSMENT & PLAN:  Atypical lobular hyperplasia (ALH) of right breast Screening mammogram on 01/11/20 showed right breast calcifications, 0.6cm, and a left breast mass at the 11:30 position, 0.7cm.  Biopsy on 02/06/20 showed atypical lobular hyperplasia and calcifications in the right breast, and in the left breast, fibrocystic changes and no evidence of malignancy. Right lumpectomy on 05/01/20 with Dr. Marlou Starks: Atypical lobular hyperplasia.  Atypical lobular hyperplasia: This is characterized by abnormal cells that are filling part of the lobule. It appears to increase the risk of breast cancer. There is risk of both ipsilateral and contralateral breast cancers. The cumulative incidence of breast cancer is approximately 1 %/year.  [place] 10-year risk 11% and lifetime risk 35%  Risk Reduction: Tamoxifen  Tamoxifen Toxicities: Moderate to severe hot flashes: I encouraged her to take the tamoxifen at bedtime.  Right breast pain and discomfort: On gabapentin 300 mg p.o. 3 times daily. Mammogram 10/02/2020  right breast: Postsurgical changes without any evidence of malignancy.  Breast density category C With gabapentin the pain has subsided.  Breast Cancer Surveillance:  Annual mammograms, will be arranged for April 2022 breast MRI because a lifetime risk of breast cancer risk of more than 20%: Will be arranged for October 2022  Follow-up in 1 year.    Orders Placed This Encounter  Procedures  . MR BREAST BILATERAL W WO CONTRAST INC CAD    Standing Status:   Future    Standing Expiration Date:   10/15/2021    Order Specific Question:   If indicated for the ordered procedure, I authorize the administration of  contrast media per Radiology protocol    Answer:   Yes    Order Specific Question:   What is the patient's sedation requirement?    Answer:   No Sedation    Order Specific Question:   Does the patient have a pacemaker or implanted devices?    Answer:   No    Order Specific Question:   Preferred imaging location?    Answer:   [facility] (table limit-550lbs)  . MM DIAG BREAST TOMO BILATERAL    Standing Status:   Future    Standing Expiration Date:   10/15/2021    Order Specific Question:   Reason for Exam (SYMPTOM  OR DIAGNOSIS REQUIRED)    Answer:   High risk of breast cancer    Order Specific Question:   Is the patient pregnant?    Answer:   No    Order Specific Question:   Preferred imaging location?    Answer:   [facility]    Order Specific Question:   Release to patient    Answer:   Immediate   The patient has a good understanding of the overall plan. she agrees with it. she will call with any problems that may develop before the next visit here.  Total time spent: 20 mins including face to face time and time spent for planning, charting and coordination of care  Nicholas Lose, MD 10/15/2020  I, Cloyde Reams Dorshimer, am acting as scribe for Dr. Nicholas Lose.  I have reviewed the above documentation for accuracy and completeness, and I agree with the above.

## 2020-10-15 ENCOUNTER — Other Ambulatory Visit: Payer: Self-pay

## 2020-10-15 ENCOUNTER — Inpatient Hospital Stay: Payer: 59 | Attending: Hematology and Oncology | Admitting: Hematology and Oncology

## 2020-10-15 DIAGNOSIS — N6011 Diffuse cystic mastopathy of right breast: Secondary | ICD-10-CM | POA: Insufficient documentation

## 2020-10-15 DIAGNOSIS — N6322 Unspecified lump in the left breast, upper inner quadrant: Secondary | ICD-10-CM | POA: Insufficient documentation

## 2020-10-15 DIAGNOSIS — Z79899 Other long term (current) drug therapy: Secondary | ICD-10-CM | POA: Insufficient documentation

## 2020-10-15 DIAGNOSIS — N6012 Diffuse cystic mastopathy of left breast: Secondary | ICD-10-CM | POA: Diagnosis present

## 2020-10-15 DIAGNOSIS — Z882 Allergy status to sulfonamides status: Secondary | ICD-10-CM | POA: Diagnosis not present

## 2020-10-15 DIAGNOSIS — R232 Flushing: Secondary | ICD-10-CM | POA: Diagnosis not present

## 2020-10-15 DIAGNOSIS — N6091 Unspecified benign mammary dysplasia of right breast: Secondary | ICD-10-CM

## 2020-10-15 NOTE — Assessment & Plan Note (Signed)
Screening mammogram on 01/11/20 showed right breast calcifications, 0.6cm, and a left breast mass at the 11:30 position, 0.7cm.  Biopsy on 02/06/20 showed atypical lobular hyperplasia and calcifications in the right breast, and in the left breast, fibrocystic changes and no evidence of malignancy. Right lumpectomy on 05/01/20 with Dr. Marlou Starks: Atypical lobular hyperplasia.  Atypical lobular hyperplasia: This is characterized by abnormal cells that are filling part of the lobule. It appears to increase the risk of breast cancer. There is risk of both ipsilateral and contralateral breast cancers. The cumulative incidence of breast cancer is approximately 1 %/year.  [place] 10-year risk 11% and lifetime risk 35%  Risk Reduction: Tamoxifen  Tamoxifen Toxicities: Moderate to severe hot flashes: I encouraged her to take the tamoxifen at bedtime.  Right breast pain and discomfort: We will order a mammogram and ultrasound of the breast center.  I sent a prescription for gabapentin 300 mg p.o. 3 times daily. Mammogram 10/02/2020 right breast: Postsurgical changes without any evidence of malignancy.  Breast density category C   Breast Cancer Surveillance:  Annual mammograms,  breast MRI because a lifetime risk of breast cancer risk of more than 20%   Follow-up in 1 month to assess her pain issues and see how she is doing.

## 2021-03-20 ENCOUNTER — Telehealth: Payer: Self-pay

## 2021-03-20 NOTE — Telephone Encounter (Signed)
Per Dr Lindi Adie, pt should try topical pain relief such as Voltaren. Pt advised and verbalized thanks and understanding. Pt states she will let us know if this does not work.

## 2021-03-20 NOTE — Telephone Encounter (Signed)
Pt called c/o right breast and axilla discomfort. Pt states she has been taking Gabapentin TID which has helped, but for the past week it is no longer helping. Pt reports, "random sharp pains and burning sensation in my arm pit." This message given to Dr Lindi Adie to advise.

## 2021-04-23 ENCOUNTER — Other Ambulatory Visit: Payer: Self-pay

## 2021-04-23 ENCOUNTER — Encounter (HOSPITAL_COMMUNITY): Payer: Self-pay

## 2021-04-23 ENCOUNTER — Ambulatory Visit (HOSPITAL_COMMUNITY)
Admission: EM | Admit: 2021-04-23 | Discharge: 2021-04-23 | Disposition: A | Discharge status: Home / Self Care | Payer: 59 | Attending: Family Medicine | Admitting: Family Medicine

## 2021-04-23 ENCOUNTER — Ambulatory Visit (INDEPENDENT_AMBULATORY_CARE_PROVIDER_SITE_OTHER): Payer: 59

## 2021-04-23 DIAGNOSIS — Z20822 Contact with and (suspected) exposure to covid-19: Secondary | ICD-10-CM | POA: Insufficient documentation

## 2021-04-23 DIAGNOSIS — R062 Wheezing: Secondary | ICD-10-CM | POA: Diagnosis not present

## 2021-04-23 DIAGNOSIS — R059 Cough, unspecified: Secondary | ICD-10-CM

## 2021-04-23 DIAGNOSIS — F17210 Nicotine dependence, cigarettes, uncomplicated: Secondary | ICD-10-CM | POA: Insufficient documentation

## 2021-04-23 DIAGNOSIS — Z882 Allergy status to sulfonamides status: Secondary | ICD-10-CM | POA: Insufficient documentation

## 2021-04-23 DIAGNOSIS — R0602 Shortness of breath: Secondary | ICD-10-CM | POA: Diagnosis not present

## 2021-04-23 DIAGNOSIS — Z7952 Long term (current) use of systemic steroids: Secondary | ICD-10-CM | POA: Insufficient documentation

## 2021-04-23 LAB — SARS CORONAVIRUS 2 (TAT 6-24 HRS): SARS Coronavirus 2: NEGATIVE

## 2021-04-23 MED ORDER — PREDNISONE 20 MG PO TABS
40.0000 mg | ORAL_TABLET | Freq: Every day | ORAL | 0 refills | Status: DC
  Filled 2021-04-23: qty 10, 5d supply, fill #0

## 2021-04-23 NOTE — Discharge Instructions (Signed)
You have been tested for COVID-19 today. °If your test returns positive, you will receive a phone call from [place] regarding your results. °Negative test results are not called. °Both positive and negative results area always visible on MyChart. °If you do not have a MyChart account, sign up instructions are provided in your discharge papers. °Please do not hesitate to contact us should you have questions or concerns. ° °

## 2021-04-23 NOTE — ED Triage Notes (Signed)
Pt presents POV with c/o cough, SOB and left side chet pain X 2 days.   States she has been feeling tired and has not had an appetite. Pt states she has been coughing up black mucus. Denies extreme pain. States she is concerned she has COVID.

## 2021-04-23 NOTE — ED Provider Notes (Signed)
April Hudson   [id number] 04/23/21 Arrival Time: 1443  ASSESSMENT & PLAN:  1. Cough   2. SOB (shortness of breath)   3. Wheezing    Discussed typical duration of suspected viral illnesses. COVID-19 testing sent. OTC symptom care as needed. Begin:  Meds ordered this encounter  Medications   predniSONE (DELTASONE) 20 MG tablet    Sig: Take 2 tablets (40 mg total) by mouth daily.    Dispense:  10 tablet    Refill:  0      Discharge Instructions      You have been tested for COVID-19 today. If your test returns positive, you will receive a phone call from [facility] regarding your results. Negative test results are not called. Both positive and negative results area always visible on MyChart. If you do not have a MyChart account, sign up instructions are provided in your discharge papers. Please do not hesitate to contact April Hudson should you have questions or concerns.     Reviewed expectations re: course of current medical issues. Questions answered. Outlined signs and symptoms indicating need for more acute intervention. Understanding verbalized. After Visit Summary given.   SUBJECTIVE: History from: patient. April Hudson is a 50 y.o. female who presents with worries regarding COVID-19. Known COVID-19 contact: none. Recent travel: none. Reports: cough and nasal cong; 2 days. Ques wheezing at times. Occas SOB. Smoker. Denies: fever. Normal PO intake without n/v/d.   OBJECTIVE:  Vitals:   04/23/21 1332 04/23/21 1333  BP:  107/62  Pulse: 87   Resp: 15   Temp: 98.7 F (37.1 C)   TempSrc: Oral   SpO2: 100%     General appearance: alert; no distress; appears fatigued Eyes: PERRLA; EOMI; conjunctiva normal HENT: [place]; AT; with nasal congestion Neck: supple  Lungs: speaks full sentences without difficulty; unlabored; exp wheezing bilat Extremities: no edema Skin: warm and dry Neurologic: normal gait Psychological: alert and cooperative; normal mood  and affect  Labs:  Labs Reviewed  SARS CORONAVIRUS 2 (TAT 6-24 HRS)     Allergies  Allergen Reactions   Sulfa Antibiotics Hives and Rash    Past Medical History:  Diagnosis Date   Carpal tunnel syndrome of right wrist    Ganglion cyst of wrist, right    GERD (gastroesophageal reflux disease)    History of adenomatous polyp of colon    tubular adenoma's 09/ 2016;  06/ 2017   History of Clostridium difficile colitis 05/21/2016   History of frequent urinary tract infections    History of gastric ulcer    clinical diagnosis, no prior EGD   History of uterine fibroid    Left breast mass    Migraines    OA (osteoarthritis)    Smoker    1ppd   Social History   Socioeconomic History   Marital status: Married    Spouse name: Not on file   Number of children: 2   Years of education: Not on file   Highest education level: GED or equivalent  Occupational History   Occupation: unemployed    Comment: Agricultural engineer  Tobacco Use   Smoking status: Every Day    Packs/day: 1.00    Years: 29.00    Pack years: 29.00    Types: Cigarettes   Smokeless tobacco: Never  Vaping Use   Vaping Use: Never used  Substance and Sexual Activity   Alcohol use: No    Alcohol/week: 0.0 standard drinks   Drug use: No   Sexual  activity: Yes    Birth control/protection: Surgical  Other Topics Concern   Not on file  Social History Narrative   Lives with husband in a one story home.     Has 6 children (2 biological).     Works at [employer]..  Education: GED   Social Determinants of Health   Financial Resource Strain: Not on file  Food Insecurity: Not on file  Transportation Needs: Not on file  Physical Activity: Not on file  Stress: Not on file  Social Connections: Not on file  Intimate Partner Violence: Not on file   Family History  Problem Relation Age of Onset   Diabetes Mother    Hypertension Mother    Ovarian cancer Mother    Stomach cancer Maternal Uncle    Heart disease  Sister    Healthy Brother    Asthma Son    Asthma Daughter    Colon cancer Maternal Uncle 53   Colon polyps Neg Hx    Esophageal cancer Neg Hx    Rectal cancer Neg Hx    Past Surgical History:  Procedure Laterality Date   BREAST LUMPECTOMY WITH RADIOACTIVE SEED LOCALIZATION Left 05/01/2020   Procedure: LEFT BREAST LUMPECTOMY X 2 WITH RADIOACTIVE SEED LOCALIZATION;  Surgeon: Jovita Kussmaul, MD;  Location: [place];  Service: General;  Laterality: Left;   CARPAL TUNNEL RELEASE Right 01/13/2017   Procedure: CARPAL TUNNEL RELEASE;  Surgeon: Iran Planas, MD;  Location: [place];  Service: Orthopedics;  Laterality: Right;   COLONOSCOPY  last one 01/ 03/ 2018   sigmoid scope   GANGLION CYST EXCISION Right 01/13/2017   Procedure: REMOVAL GANGLION OF WRIST;  Surgeon: Iran Planas, MD;  Location: [place];  Service: Orthopedics;  Laterality: Right;   KNEE ARTHROSCOPY Left ~ 2015   VAGINAL HYSTERECTOMY  2006     April Kick, MD 04/23/21 [phone]

## 2021-04-30 ENCOUNTER — Other Ambulatory Visit: Payer: Self-pay

## 2021-05-14 ENCOUNTER — Telehealth: Payer: Self-pay | Admitting: *Deleted

## 2021-05-14 NOTE — Telephone Encounter (Signed)
Pt states she is having more consistent pain in her right breast, it taking Gabapentin TID. At times pain is severe. Wants to know if she needs to see Dr Lindi Adie. Next appt 10/15/21.  Also states she left her tamoxifen in [place] and daughter threw it out. Called [facility] for refill and they said she had to call MD. RN attempted to call [facility]. RN called [facility] to get Tamoxifen refilled.

## 2021-06-01 ENCOUNTER — Other Ambulatory Visit: Payer: Self-pay

## 2021-06-01 ENCOUNTER — Ambulatory Visit: Payer: 59 | Attending: Nurse Practitioner | Admitting: Nurse Practitioner

## 2021-06-01 ENCOUNTER — Other Ambulatory Visit: Payer: Self-pay | Admitting: Nurse Practitioner

## 2021-06-01 ENCOUNTER — Encounter: Payer: Self-pay | Admitting: Nurse Practitioner

## 2021-06-01 VITALS — BP 113/81 | HR 81 | Temp 98.7°F | Resp 16 | Wt 148.0 lb

## 2021-06-01 DIAGNOSIS — Z1159 Encounter for screening for other viral diseases: Secondary | ICD-10-CM

## 2021-06-01 DIAGNOSIS — R7303 Prediabetes: Secondary | ICD-10-CM

## 2021-06-01 DIAGNOSIS — H539 Unspecified visual disturbance: Secondary | ICD-10-CM

## 2021-06-01 DIAGNOSIS — L989 Disorder of the skin and subcutaneous tissue, unspecified: Secondary | ICD-10-CM

## 2021-06-01 DIAGNOSIS — G5603 Carpal tunnel syndrome, bilateral upper limbs: Secondary | ICD-10-CM | POA: Diagnosis not present

## 2021-06-01 DIAGNOSIS — R7989 Other specified abnormal findings of blood chemistry: Secondary | ICD-10-CM

## 2021-06-01 DIAGNOSIS — E785 Hyperlipidemia, unspecified: Secondary | ICD-10-CM

## 2021-06-01 MED ORDER — PREGABALIN 50 MG PO CAPS
50.0000 mg | ORAL_CAPSULE | Freq: Three times a day (TID) | ORAL | 0 refills | Status: DC
Start: 2021-06-01 — End: 2022-12-27

## 2021-06-01 NOTE — Progress Notes (Signed)
Bumps on right eye, itching Hand pain, carpal tunnel, loosing grip

## 2021-06-01 NOTE — Progress Notes (Signed)
Assessment & Plan:  Caylin was seen today for skin problem.  Diagnoses and all orders for this visit:  Carpal tunnel syndrome, bilateral -     pregabalin (LYRICA) 50 MG capsule; Take 1 capsule (50 mg total) by mouth 3 (three) times daily.  Skin lesions -     Ambulatory referral to Dermatology  Visual disturbances -     Ambulatory referral to Ophthalmology  Need for hepatitis C screening test -     HCV Ab w Reflex to Quant PCR   Patient has been counseled on age-appropriate routine health concerns for screening and prevention. These are reviewed and up-to-date. Referrals have been placed accordingly. Immunizations are up-to-date or declined.    Subjective:   Chief Complaint  Patient presents with   Skin Problem   [name] 50 y.o. female presents to office today with complaints of "bumps" on the right upper eyelid.   She has a past medical history of Carpal tunnel syndrome of right wrist, Ganglion cyst of wrist, right, GERD, History of adenomatous polyp of colon, History of Clostridium difficile colitis (05/21/2016), History of frequent urinary tract infections, History of gastric ulcer, History of uterine fibroid, Left breast mass, Migraines, OA, and Tobacco Dependence  Carpal Tunnel Syndrome Current symptoms include  pain in the bilateral wrists along with burning, numbness and tingling in the fingertips. . Inciting event/aggravating factors:  previously worked as a Chartered certified accountant.   Patient's course of [id number] worsening. Evaluation to date:  previous R carpal tunnel  release surgery 01-2017 .   Gabapentin has been ineffective. Due to history of GI Ulcers NSAIDs are not recommended.    Skin Lesion She has 2 small nodules on the inner canthus of the right eye. Associated symptoms: itching.   Review of Systems  Constitutional:  Negative for fever, malaise/fatigue and weight loss.  HENT: Negative.  Negative for nosebleeds.   Eyes: Negative.  Negative for  blurred vision, double vision and photophobia.  Respiratory: Negative.  Negative for cough and shortness of breath.   Cardiovascular: Negative.  Negative for chest pain, palpitations and leg swelling.  Gastrointestinal: Negative.  Negative for heartburn, nausea and vomiting.  Musculoskeletal:  Positive for joint pain. Negative for myalgias.  Skin:  Positive for itching.       SEE HPI  Neurological:  Positive for tingling and sensory change. Negative for dizziness, focal weakness, seizures and headaches.  Psychiatric/Behavioral: Negative.  Negative for suicidal ideas.    Past Medical History:  Diagnosis Date   Carpal tunnel syndrome of right wrist    Ganglion cyst of wrist, right    GERD (gastroesophageal reflux disease)    History of adenomatous polyp of colon    tubular adenoma's 09/ 2016;  06/ 2017   History of Clostridium difficile colitis 05/21/2016   History of frequent urinary tract infections    History of gastric ulcer    clinical diagnosis, no prior EGD   History of uterine fibroid    Left breast mass    Migraines    OA (osteoarthritis)    Smoker    1ppd    Past Surgical History:  Procedure Laterality Date   BREAST LUMPECTOMY WITH RADIOACTIVE SEED LOCALIZATION Left 05/01/2020   Procedure: LEFT BREAST LUMPECTOMY X 2 WITH RADIOACTIVE SEED LOCALIZATION;  Surgeon: Jovita Kussmaul, MD;  Location: [place];  Service: General;  Laterality: Left;   CARPAL TUNNEL RELEASE Right 01/13/2017   Procedure: CARPAL TUNNEL RELEASE;  Surgeon: Iran Planas, MD;  Location: [place];  Service: Orthopedics;  Laterality: Right;   COLONOSCOPY  last one 01/ 03/ 2018   sigmoid scope   GANGLION CYST EXCISION Right 01/13/2017   Procedure: REMOVAL GANGLION OF WRIST;  Surgeon: Iran Planas, MD;  Location: [employer];  Service: Orthopedics;  Laterality: Right;   KNEE ARTHROSCOPY Left ~ 2015   VAGINAL HYSTERECTOMY  2006    Family History  Problem  Relation Age of Onset   Diabetes Mother    Hypertension Mother    Ovarian cancer Mother    Stomach cancer Maternal Uncle    Heart disease Sister    Healthy Brother    Asthma Son    Asthma Daughter    Colon cancer Maternal Uncle 47   Colon polyps Neg Hx    Esophageal cancer Neg Hx    Rectal cancer Neg Hx     Social History Reviewed with no changes to be made today.   Outpatient Medications Prior to Visit  Medication Sig Dispense Refill   gabapentin (NEURONTIN) 300 MG capsule Take 1 capsule (300 mg total) by mouth 3 (three) times daily. 90 capsule 6   tamoxifen (NOLVADEX) 20 MG tablet Take 1 tablet (20 mg total) by mouth daily. 90 tablet 3   predniSONE (DELTASONE) 20 MG tablet Take 2 tablets (40 mg total) by mouth daily. (Patient not taking: Reported on 06/01/2021) 10 tablet 0   No facility-administered medications prior to visit.    Allergies  Allergen Reactions   Sulfa Antibiotics Hives and Rash       Objective:    BP 113/81 (BP Location: Right Arm, Patient Position: Sitting, Cuff Size: Normal)   Pulse 81   Temp 98.7 F (37.1 C) (Oral)   Resp 16   Wt 148 lb (67.1 kg)   SpO2 100%   BMI 27.96 kg/m  Wt Readings from Last 3 Encounters:  06/01/21 148 lb (67.1 kg)  10/15/20 160 lb 1.6 oz (72.6 kg)  09/17/20 153 lb 3.2 oz (69.5 kg)    Physical Exam Vitals and nursing note reviewed.  Constitutional:      Appearance: She is well-developed.  HENT:     Head: Normocephalic and atraumatic.  Cardiovascular:     Rate and Rhythm: Normal rate and regular rhythm.     Heart sounds: Normal heart sounds. No murmur heard.   No friction rub. No gallop.  Pulmonary:     Effort: Pulmonary effort is normal. No tachypnea or respiratory distress.     Breath sounds: Normal breath sounds. No decreased breath sounds, wheezing, rhonchi or rales.  Chest:     Chest wall: No tenderness.  Abdominal:     General: Bowel sounds are normal.     Palpations: Abdomen is soft.  Musculoskeletal:         General: Normal range of motion.     Right wrist: No swelling or deformity.     Left wrist: No swelling or deformity.     Right hand: No swelling or deformity.     Left hand: No swelling or deformity.     Cervical back: Normal range of motion.  Skin:    General: Skin is warm and dry.  Neurological:     Mental Status: She is alert and oriented to person, place, and time.     Coordination: Coordination normal.  Psychiatric:        Behavior: Behavior normal. Behavior is cooperative.        Thought Content: Thought  content normal.        Judgment: Judgment normal.         Patient has been counseled extensively about nutrition and exercise as well as the importance of adherence with medications and regular follow-up. The patient was given clear instructions to go to ER or return to medical center if symptoms don't improve, worsen or new problems develop. The patient verbalized understanding.   Follow-up: Return in about 3 months (around 09/01/2021).   [name], FNP-BC [facility] and [place], [place]   06/01/2021, 7:49 PM

## 2021-06-02 LAB — HCV AB W REFLEX TO QUANT PCR: HCV Ab: <0.1 s/co ratio (ref 0.0–0.9)

## 2021-06-02 LAB — HCV INTERPRETATION

## 2021-06-09 ENCOUNTER — Telehealth: Payer: Self-pay | Admitting: Dermatology

## 2021-06-09 NOTE — Telephone Encounter (Signed)
Please contact referrer (Dr. Raul Del, [facility]) office + see if they can get her in elsewhere sooner.

## 2021-06-09 NOTE — Telephone Encounter (Signed)
Notes documented and referral routed back to referring office. 

## 2021-06-17 ENCOUNTER — Telehealth: Payer: Self-pay | Admitting: Nurse Practitioner

## 2021-06-17 NOTE — Telephone Encounter (Signed)
Gm   Sent Referral  [facility]  [address]  [place] [address] Ph# [phone]

## 2021-06-17 NOTE — Telephone Encounter (Signed)
Copied from [place] [phone]. Topic: Referral - Status >> Jun 16, 2021  1:20 PM Erick Blinks wrote: Reason for CRM: Pt called back to report that her current dermatology referral cannot see her until March of 2023, she is requesting a new referral to be seen sooner   Best contact: [phone]

## 2021-07-08 ENCOUNTER — Other Ambulatory Visit: Payer: Self-pay | Admitting: Nurse Practitioner

## 2021-07-08 DIAGNOSIS — G5603 Carpal tunnel syndrome, bilateral upper limbs: Secondary | ICD-10-CM

## 2021-07-13 ENCOUNTER — Encounter: Payer: Self-pay | Admitting: Orthopedic Surgery

## 2021-07-13 ENCOUNTER — Ambulatory Visit (INDEPENDENT_AMBULATORY_CARE_PROVIDER_SITE_OTHER): Payer: 59 | Admitting: Orthopedic Surgery

## 2021-07-13 VITALS — BP 107/75 | HR 83 | Ht 61.0 in | Wt 148.0 lb

## 2021-07-13 DIAGNOSIS — M654 Radial styloid tenosynovitis [de Quervain]: Secondary | ICD-10-CM | POA: Diagnosis not present

## 2021-07-13 MED ORDER — BETAMETHASONE SOD PHOS & ACET 6 (3-3) MG/ML IJ SUSP
6.0000 mg | INTRAMUSCULAR | Status: AC | PRN
  Administered 2021-07-13: 6 mg via INTRA_ARTICULAR

## 2021-07-13 MED ORDER — LIDOCAINE HCL 1 % IJ SOLN
1.0000 mL | INTRAMUSCULAR | Status: AC | PRN
  Administered 2021-07-13: 1 mL

## 2021-07-13 NOTE — Progress Notes (Signed)
Office Visit Note   Patient: April Hudson           Date of Birth: 1971/07/14           MRN: [id number] Visit Date: 07/13/2021              Requested by: [name], NP [place],  [place] [address] PCP: [name], NP   Assessment & Plan: Visit Diagnoses:  1. De Quervain's tenosynovitis, left     Plan: We discussed the diagnosis, prognosis, non-operative and operative treatment options for DeQuervain's tenosynovitis.  After our discussion, the patient would like to proceed with corticosteroid injection into the first dorsal compartment.  We reviewed the risks and benefits of conservative management including alterations in her blood sugar.  The patient expressed understanding of the reasoning and strategy going forward.  She will closely monitor her blood sugar over the next several days.  All patient questions and concerns were addressed.    Follow-Up Instructions: No follow-ups on file.   Orders:  No orders of the defined types were placed in this encounter.  No orders of the defined types were placed in this encounter.     Procedures: Hand/UE Inj: L extensor compartment 1 for de Quervain's tenosynovitis on 07/13/2021 11:29 AM Details: 25 G needle, radial approach Medications: 1 mL lidocaine 1 %; 6 mg betamethasone acetate-betamethasone sodium phosphate 6 (3-3) MG/ML Outcome: tolerated well, no immediate complications Procedure, treatment alternatives, risks and benefits explained, specific risks discussed. Consent was given by the patient. Patient was prepped and draped in the usual sterile fashion.      Clinical Data: No additional findings.   Subjective: Chief Complaint  Patient presents with   Right Hand - Pain    RIGHT Hand dom, NKI, Hx of surgery 4-5 yrs ago on the right, + n/t, weakness, swelling, Pain 8/10, pain at base of right thumb and into the wrist, onset x 4-5 months, EMG/NCS done 11/23/2016   Left Hand - Pain    Same  pain in the left side, wearing brace on Left wrist    This is a 50 yo RHD F who complains of left radial sided wrist pain.  This has been present for 3-4 months.  The pain is localized to right around the radial styloid.  It is 8/10 at worst and worse w/ certain activities.  She has been wearing an OTC wrist brace with minimal symptom relief.  She has not tried any other treatment.    Review of Systems   Objective: Vital Signs: BP 107/75 (BP Location: Left Arm, Patient Position: Sitting)   Pulse 83   Ht 5\' 1"  (1.549 m)   Wt 148 lb (67.1 kg)   BMI 27.96 kg/m   Physical Exam Constitutional:      Appearance: Normal appearance.  Cardiovascular:     Rate and Rhythm: Normal rate.     Pulses: Normal pulses.  Skin:    General: Skin is warm and dry.     Capillary Refill: Capillary refill takes less than 2 seconds.  Neurological:     Mental Status: She is alert.    Left Hand Exam   Tenderness  Left hand tenderness location: TTP at radial styloid around first dorsal compartment.   Range of Motion  The patient has normal left wrist ROM.  Muscle Strength  The patient has normal left wrist strength.  Other  Sensation: normal Pulse: present  Comments:  Moderate swelling over radial styloid.  +  Finkelstein test.  Minimal pain at base of thumb w/ negative CMC grind test.  No palpable triggering of thumb.      Specialty Comments:  No specialty comments available.  Imaging: No results found.   PMFS History: Patient Active Problem List   Diagnosis Date Noted   Atypical lobular hyperplasia [facility]) of right breast 06/18/2020   Influenza A 10/23/2017   Ganglion cyst of wrist, right 09/16/2016   Esophageal reflux 05/24/2016   Anal fissure 10/23/2015   Epidermoid cyst 09/02/2015   Rectal polyp,focal high grade dysplasia 06/27/2015   Tinea pedis 05/26/2015   Pain of left thumb 05/26/2015   Osteoarthritis of left wrist 04/29/2015   De Quervain's tenosynovitis, left 04/14/2015    Neck pain on right side 01/13/2015   Carpal tunnel syndrome 12/30/2014   Smoker 07/03/2014   Family history of stomach cancer 07/03/2014   Past Medical History:  Diagnosis Date   Carpal tunnel syndrome of right wrist    Ganglion cyst of wrist, right    GERD (gastroesophageal reflux disease)    History of adenomatous polyp of colon    tubular adenoma's 09/ 2016;  06/ 2017   History of Clostridium difficile colitis 05/21/2016   History of frequent urinary tract infections    History of gastric ulcer    clinical diagnosis, no prior EGD   History of uterine fibroid    Left breast mass    Migraines    OA (osteoarthritis)    Smoker    1ppd    Family History  Problem Relation Age of Onset   Diabetes Mother    Hypertension Mother    Ovarian cancer Mother    Stomach cancer Maternal Uncle    Heart disease Sister    Healthy Brother    Asthma Son    Asthma Daughter    Colon cancer Maternal Uncle 69   Colon polyps Neg Hx    Esophageal cancer Neg Hx    Rectal cancer Neg Hx     Past Surgical History:  Procedure Laterality Date   BREAST LUMPECTOMY WITH RADIOACTIVE SEED LOCALIZATION Left 05/01/2020   Procedure: LEFT BREAST LUMPECTOMY X 2 WITH RADIOACTIVE SEED LOCALIZATION;  Surgeon: Jovita Kussmaul, MD;  Location: [place];  Service: General;  Laterality: Left;   CARPAL TUNNEL RELEASE Right 01/13/2017   Procedure: CARPAL TUNNEL RELEASE;  Surgeon: Iran Planas, MD;  Location: [place];  Service: Orthopedics;  Laterality: Right;   COLONOSCOPY  last one 01/ 03/ 2018   sigmoid scope   GANGLION CYST EXCISION Right 01/13/2017   Procedure: REMOVAL GANGLION OF WRIST;  Surgeon: Iran Planas, MD;  Location: [place];  Service: Orthopedics;  Laterality: Right;   KNEE ARTHROSCOPY Left ~ 2015   VAGINAL HYSTERECTOMY  2006   Social History   Occupational History   Occupation: unemployed    Comment: Agricultural engineer  Tobacco Use   Smoking status:  Every Day    Packs/day: 1.00    Years: 29.00    Pack years: 29.00    Types: Cigarettes   Smokeless tobacco: Never  Vaping Use   Vaping Use: Never used  Substance and Sexual Activity   Alcohol use: No    Alcohol/week: 0.0 standard drinks   Drug use: No   Sexual activity: Yes    Birth control/protection: Surgical

## 2021-08-10 ENCOUNTER — Ambulatory Visit: Payer: 59 | Admitting: Orthopedic Surgery

## 2021-08-10 ENCOUNTER — Other Ambulatory Visit: Payer: Self-pay | Admitting: Hematology and Oncology

## 2021-08-20 ENCOUNTER — Ambulatory Visit: Payer: 59 | Admitting: Orthopedic Surgery

## 2021-08-31 ENCOUNTER — Ambulatory Visit: Payer: 59 | Admitting: Orthopedic Surgery

## 2021-10-14 ENCOUNTER — Other Ambulatory Visit: Payer: Self-pay | Admitting: Hematology and Oncology

## 2021-10-14 DIAGNOSIS — M542 Cervicalgia: Secondary | ICD-10-CM

## 2021-10-14 NOTE — Assessment & Plan Note (Deleted)
Screening mammogram on 01/11/20 showed right breast calcifications, 0.6cm, and a left breast mass at the 11:30 position, 0.7cm.  Biopsy on 02/06/20 showed atypical lobular hyperplasia and calcifications in the right breast, and in the left breast, fibrocystic changes and no evidence of malignancy. Right lumpectomy on 05/01/20 with Dr. Marlou Starks: Atypical lobular hyperplasia.  TyrerCusick 10-year risk 11% and lifetime risk 35%  Risk Reduction: Tamoxifen  Tamoxifen Toxicities:Moderate to severe hot flashes: I encouraged her to take the tamoxifen at bedtime.  Right breast pain and discomfort: On gabapentin 300 mg p.o. 3 times daily. Mammogram 10/02/2020 right breast: Postsurgical changes without any evidence of malignancy.  Breast density category C With gabapentin the pain has subsided.  Breast Cancer Surveillance: Annual mammograms, 09/22/20: Benign Density Cat C breast MRI because a lifetime risk of breast cancer risk of more than 20%: Will be arranged for October 2022  Follow-up in 1 year.

## 2021-10-15 ENCOUNTER — Telehealth: Payer: Self-pay

## 2021-10-15 ENCOUNTER — Inpatient Hospital Stay: Payer: Self-pay | Attending: Hematology and Oncology | Admitting: Hematology and Oncology

## 2021-10-15 DIAGNOSIS — M7989 Other specified soft tissue disorders: Secondary | ICD-10-CM | POA: Insufficient documentation

## 2021-10-15 DIAGNOSIS — F17210 Nicotine dependence, cigarettes, uncomplicated: Secondary | ICD-10-CM | POA: Insufficient documentation

## 2021-10-15 DIAGNOSIS — N6091 Unspecified benign mammary dysplasia of right breast: Secondary | ICD-10-CM | POA: Insufficient documentation

## 2021-10-15 DIAGNOSIS — Z9189 Other specified personal risk factors, not elsewhere classified: Secondary | ICD-10-CM | POA: Insufficient documentation

## 2021-10-15 NOTE — Telephone Encounter (Signed)
Return call to pt, complains of right breast pain, unsure if warm to the touch or swollen.  I scheduled pt to see Mendel Ryder tomorrow for symptom management, pt verbalized understanding and thanks

## 2021-10-15 NOTE — Progress Notes (Deleted)
° °  Patient Care Team: [name], NP as PCP - General (Nurse Practitioner)  DIAGNOSIS:  Encounter Diagnosis  Name Primary?   Atypical lobular hyperplasia (ALH) of right breast     CHIEF COMPLIANT:   INTERVAL HISTORY: April Hudson is a   ALLERGIES:  is allergic to sulfa antibiotics.  MEDICATIONS:  Current Outpatient Medications  Medication Sig Dispense Refill   gabapentin (NEURONTIN) 300 MG capsule Take 1 capsule (300 mg total) by mouth 3 (three) times daily. 90 capsule 6   pregabalin (LYRICA) 50 MG capsule Take 1 capsule (50 mg total) by mouth 3 (three) times daily. 90 capsule 0   tamoxifen (NOLVADEX) 20 MG tablet TAKE 1 TABLET(20 MG) BY MOUTH DAILY 90 tablet 0   No current facility-administered medications for this visit.    PHYSICAL EXAMINATION: ECOG PERFORMANCE STATUS: 1 - Symptomatic but completely ambulatory    LABORATORY DATA:  I have reviewed the data as listed CMP Latest Ref Rng & Units 12/18/2019 12/02/2019 12/23/2018  Glucose 65 - 99 mg/dL 87 89 114(H)  BUN 6 - 24 mg/dL 7 <5(L) 9  Creatinine 0.57 - 1.00 mg/dL 0.83 0.66 0.98  Sodium 134 - 144 mmol/L 144 142 138  Potassium 3.5 - 5.2 mmol/L 4.1 3.5 3.4(L)  Chloride 96 - 106 mmol/L 106 108 103  CO2 20 - 29 mmol/L 24 22 26   Calcium 8.7 - 10.2 mg/dL 9.4 9.4 9.4  Total Protein 6.0 - 8.5 g/dL 6.7 - -  Total Bilirubin 0.0 - 1.2 mg/dL 0.3 - -  Alkaline Phos 39 - 117 IU/L 64 - -  AST 0 - 40 IU/L 18 - -  ALT 0 - 32 IU/L 10 - -    Lab Results  Component Value Date   WBC 6.0 01/24/2020   HGB 15.9 (H) 01/24/2020   HCT 46.4 (H) 01/24/2020   MCV 98.7 01/24/2020   PLT 196.0 01/24/2020   NEUTROABS 3.0 01/24/2020    ASSESSMENT & PLAN:  Atypical lobular hyperplasia (ALH) of right breast Screening mammogram on 01/11/20 showed right breast calcifications, 0.6cm, and a left breast mass at the 11:30 position, 0.7cm.  Biopsy on 02/06/20 showed atypical lobular hyperplasia and calcifications in the right breast, and in  the left breast, fibrocystic changes and no evidence of malignancy.  Right lumpectomy on 05/01/20 with Dr. Marlou Starks: Atypical lobular hyperplasia.  Tyrer Cusick 10-year risk 11% and lifetime risk 35%   Risk Reduction: Tamoxifen  Tamoxifen Toxicities: Moderate to severe hot flashes: I encouraged her to take the tamoxifen at bedtime.   Right breast pain and discomfort: On gabapentin 300 mg p.o. 3 times daily. Mammogram 10/02/2020 right breast: Postsurgical changes without any evidence of malignancy.  Breast density category C With gabapentin the pain has subsided.   Breast Cancer Surveillance:  Annual mammograms, 09/22/20: Benign Density Cat C breast MRI because a lifetime risk of breast cancer risk of more than 20%: Will be arranged for October 2022   Follow-up in 1 year.     No orders of the defined types were placed in this encounter.  The patient has a good understanding of the overall plan. she agrees with it. she will call with any problems that may develop before the next visit here. Total time spent: 30 mins including face to face time and time spent for planning, charting and co-ordination of care   Harriette Ohara, MD 10/15/21

## 2021-10-16 ENCOUNTER — Other Ambulatory Visit: Payer: Self-pay

## 2021-10-16 ENCOUNTER — Encounter: Payer: Self-pay | Admitting: Adult Health

## 2021-10-16 ENCOUNTER — Inpatient Hospital Stay (HOSPITAL_BASED_OUTPATIENT_CLINIC_OR_DEPARTMENT_OTHER): Payer: Self-pay | Admitting: Adult Health

## 2021-10-16 VITALS — BP 112/76 | HR 95 | Temp 97.5°F | Resp 18 | Wt 148.2 lb

## 2021-10-16 DIAGNOSIS — Z9189 Other specified personal risk factors, not elsewhere classified: Secondary | ICD-10-CM

## 2021-10-16 DIAGNOSIS — M7989 Other specified soft tissue disorders: Secondary | ICD-10-CM

## 2021-10-16 DIAGNOSIS — F17200 Nicotine dependence, unspecified, uncomplicated: Secondary | ICD-10-CM

## 2021-10-16 DIAGNOSIS — N6091 Unspecified benign mammary dysplasia of right breast: Secondary | ICD-10-CM

## 2021-10-16 NOTE — Progress Notes (Signed)
[facility] Follow up:    [name], NP [place] [id number]   DIAGNOSIS: Atypical lobular hyperplasia: This is characterized by abnormal cells that are filling part of the lobule.  It appears to increase the risk of breast cancer.  There is risk of both ipsilateral and contralateral breast cancers.  The cumulative incidence of breast cancer is approximately 1 %/year.    SUMMARY OF HIGH RISK HISTORY:  (1) Screening mammogram on 01/11/20 showed right breast calcifications, 0.6cm, and a left breast mass at the 11:30 position, 0.7cm. Biopsy on 02/06/20 showed atypical lobular hyperplasia and calcifications in the right breast, and in the left breast, fibrocystic changes and no evidence of malignancy.   (2)Right lumpectomy on 05/01/20 with Dr. Marlou Starks: Atypical lobular hyperplasia  (3) Tyrer Cusick 10-year risk 11% and lifetime risk 35%  (4) Tamoxifen started for risk reduction in 06/2020  (5) Intensified screening with MRI in October and mammogram in April   CURRENT THERAPY: Tamoxifen  INTERVAL HISTORY: April Hudson 51 y.o. female returns for evaluation of recurrence of her right breast pain.  She describes the pain as sharp and runs right through her breast.  It began about a week ago and is worsening.  She says the pain is intermittent, and when it hits it hurts, and is increasing in frequency.  She denies any precipitating event.  No alleviating or aggravating factors.  She is taking Gabapentin for the pain but it is not improving her pain.  She told me she doesn't know if she has had redness or swelling.  Patient Active Problem List   Diagnosis Date Noted   Atypical lobular hyperplasia [facility]) of right breast 06/18/2020   Influenza A 10/23/2017   Ganglion cyst of wrist, right 09/16/2016   Esophageal reflux 05/24/2016   Anal fissure 10/23/2015   Epidermoid cyst 09/02/2015   Rectal polyp,focal high grade dysplasia 06/27/2015   Tinea pedis  05/26/2015   Pain of left thumb 05/26/2015   Osteoarthritis of left wrist 04/29/2015   De Quervain's tenosynovitis, left 04/14/2015   Neck pain on right side 01/13/2015   Carpal tunnel syndrome 12/30/2014   Smoker 07/03/2014   Family history of stomach cancer 07/03/2014    is allergic to sulfa antibiotics.  MEDICAL HISTORY: Past Medical History:  Diagnosis Date   Carpal tunnel syndrome of right wrist    Ganglion cyst of wrist, right    GERD (gastroesophageal reflux disease)    History of adenomatous polyp of colon    tubular adenoma's 09/ 2016;  06/ 2017   History of Clostridium difficile colitis 05/21/2016   History of frequent urinary tract infections    History of gastric ulcer    clinical diagnosis, no prior EGD   History of uterine fibroid    Left breast mass    Migraines    OA (osteoarthritis)    Smoker    1ppd    SURGICAL HISTORY: Past Surgical History:  Procedure Laterality Date   BREAST LUMPECTOMY WITH RADIOACTIVE SEED LOCALIZATION Left 05/01/2020   Procedure: LEFT BREAST LUMPECTOMY X 2 WITH RADIOACTIVE SEED LOCALIZATION;  Surgeon: Jovita Kussmaul, MD;  Location: [place];  Service: General;  Laterality: Left;   CARPAL TUNNEL RELEASE Right 01/13/2017   Procedure: CARPAL TUNNEL RELEASE;  Surgeon: Iran Planas, MD;  Location: [place];  Service: Orthopedics;  Laterality: Right;   COLONOSCOPY  last one 01/ 03/ 2018   sigmoid scope  GANGLION CYST EXCISION Right 01/13/2017   Procedure: REMOVAL GANGLION OF WRIST;  Surgeon: Iran Planas, MD;  Location: [place];  Service: Orthopedics;  Laterality: Right;   KNEE ARTHROSCOPY Left ~ 2015   VAGINAL HYSTERECTOMY  2006    SOCIAL HISTORY: Social History   Socioeconomic History   Marital status: Married    Spouse name: Not on file   Number of children: 2   Years of education: Not on file   Highest education level: GED or equivalent  Occupational History   Occupation:  unemployed    Comment: Agricultural engineer  Tobacco Use   Smoking status: Every Day    Packs/day: 1.00    Years: 29.00    Pack years: 29.00    Types: Cigarettes   Smokeless tobacco: Never  Vaping Use   Vaping Use: Never used  Substance and Sexual Activity   Alcohol use: No    Alcohol/week: 0.0 standard drinks   Drug use: No   Sexual activity: Yes    Birth control/protection: Surgical  Other Topics Concern   Not on file  Social History Narrative   Lives with husband in a one story home.     Has 6 children (2 biological).     Works at [employer]..  Education: GED   Social Determinants of Health   Financial Resource Strain: Not on file  Food Insecurity: Not on file  Transportation Needs: Not on file  Physical Activity: Not on file  Stress: Not on file  Social Connections: Not on file  Intimate Partner Violence: Not on file    FAMILY HISTORY: Family History  Problem Relation Age of Onset   Diabetes Mother    Hypertension Mother    Ovarian cancer Mother    Stomach cancer Maternal Uncle    Heart disease Sister    Healthy Brother    Asthma Son    Asthma Daughter    Colon cancer Maternal Uncle 42   Colon polyps Neg Hx    Esophageal cancer Neg Hx    Rectal cancer Neg Hx     Review of Systems  Constitutional:  Negative for appetite change, chills, fatigue, fever and unexpected weight change.  HENT:   Negative for hearing loss, lump/mass and trouble swallowing.   Eyes:  Negative for eye problems and icterus.  Respiratory:  Negative for chest tightness, cough and shortness of breath.   Cardiovascular:  Negative for chest pain, leg swelling and palpitations.  Gastrointestinal:  Negative for abdominal distention, abdominal pain, constipation, diarrhea, nausea and vomiting.  Endocrine: Negative for hot flashes.  Genitourinary:  Negative for difficulty urinating.   Musculoskeletal:  Negative for arthralgias.  Skin:  Negative for itching and rash.  Neurological:  Negative for  dizziness, extremity weakness, headaches and numbness.  Hematological:  Negative for adenopathy. Does not bruise/bleed easily.  Psychiatric/Behavioral:  Negative for depression. The patient is not nervous/anxious.      PHYSICAL EXAMINATION  ECOG PERFORMANCE STATUS: 1 - Symptomatic but completely ambulatory  Vitals:   10/16/21 0953  BP: 112/76  Pulse: 95  Resp: 18  Temp: (!) 97.5 F (36.4 C)  SpO2: 99%    Physical Exam Constitutional:      General: She is not in acute distress.    Appearance: Normal appearance. She is not toxic-appearing.  HENT:     Head: Normocephalic and atraumatic.  Eyes:     General: No scleral icterus. Cardiovascular:     Rate and Rhythm: Normal rate and regular  rhythm.     Pulses: Normal pulses.     Heart sounds: Normal heart sounds.  Pulmonary:     Effort: Pulmonary effort is normal.     Breath sounds: Normal breath sounds.  Chest:     Comments: Swelling noted in axilla, ? 1cm nodule versus sebaceous cyst.  Right upper outer breast skin is thicker than the rest of the breast tissue.  No nodules/masses noted Abdominal:     General: Abdomen is flat. Bowel sounds are normal. There is no distension.     Palpations: Abdomen is soft.     Tenderness: There is no abdominal tenderness.  Musculoskeletal:        General: No swelling.     Cervical back: Neck supple.  Lymphadenopathy:     Cervical: No cervical adenopathy.  Skin:    General: Skin is warm and dry.     Findings: No rash.  Neurological:     General: No focal deficit present.     Mental Status: She is alert.  Psychiatric:        Mood and Affect: Mood normal.        Behavior: Behavior normal.    LABORATORY DATA:  CBC    Component Value Date/Time   WBC 6.0 01/24/2020 1024   RBC 4.70 01/24/2020 1024   HGB 15.9 (H) 01/24/2020 1024   HGB 16.1 (H) 12/18/2019 0859   HCT 46.4 (H) 01/24/2020 1024   HCT 45.8 12/18/2019 0859   PLT 196.0 01/24/2020 1024   PLT 198 12/18/2019 0859   MCV  98.7 01/24/2020 1024   MCV 96 12/18/2019 0859   MCH 33.9 (H) 12/18/2019 0859   MCH 33.2 12/02/2019 1131   MCHC 34.2 01/24/2020 1024   RDW 13.5 01/24/2020 1024   RDW 12.5 12/18/2019 0859   LYMPHSABS 2.4 01/24/2020 1024   MONOABS 0.5 01/24/2020 1024   EOSABS 0.1 01/24/2020 1024   BASOSABS 0.1 01/24/2020 1024    CMP     Component Value Date/Time   NA 144 12/18/2019 0859   K 4.1 12/18/2019 0859   CL 106 12/18/2019 0859   CO2 24 12/18/2019 0859   GLUCOSE 87 12/18/2019 0859   GLUCOSE 89 12/02/2019 1131   BUN 7 12/18/2019 0859   CREATININE 0.83 12/18/2019 0859   CREATININE 0.79 05/24/2016 1657   CALCIUM 9.4 12/18/2019 0859   PROT 6.7 12/18/2019 0859   ALBUMIN 4.1 12/18/2019 0859   AST 18 12/18/2019 0859   ALT 10 12/18/2019 0859   ALKPHOS 64 12/18/2019 0859   BILITOT 0.3 12/18/2019 0859   GFRNONAA 82 12/18/2019 0859   GFRNONAA >89 05/24/2016 1657   GFRAA 95 12/18/2019 0859   GFRAA >89 05/24/2016 1657       ASSESSMENT and THERAPY PLAN:   Atypical lobular hyperplasia (ALH) of right breast Screening mammogram on 01/11/20 showed right breast calcifications, 0.6cm, and a left breast mass at the 11:30 position, 0.7cm.  Biopsy on 02/06/20 showed atypical lobular hyperplasia and calcifications in the right breast, and in the left breast, fibrocystic changes and no evidence of malignancy.  Right lumpectomy on 05/01/20 with Dr. Marlou Starks: Atypical lobular hyperplasia.   Atypical lobular hyperplasia: This is characterized by abnormal cells that are filling part of the lobule.  It appears to increase the risk of breast cancer.  There is risk of both ipsilateral and contralateral breast cancers.  The cumulative incidence of breast cancer is approximately 1 %/year.   Tyrer Cusick 10-year risk 11% and lifetime risk 35%  Risk Reduction: Tamoxifen  Tamoxifen Toxicities: Moderate to severe hot flashes: I encouraged her to take the tamoxifen at bedtime.   Right breast pain and discomfort: This is  coupled with physical exam abnormalities.  So this pain is different than the pain that was being managed with gabapentin.  I placed orders for mammogram and ultrasound.  For the pain and recommended that she take 1 Aleve and 1 extra strength Tylenol 3 times a day as needed.  She has a 29-pack-year tobacco history and is 50 years old.  This qualifies her for lung cancer screening.  She would like for me to order this.  All questions were answered. The patient knows to call the clinic with any problems, questions or concerns. We can certainly see the patient much sooner if necessary.  Total encounter time: 30 minutes in face-to-face visit time, chart review, lab review, care coordination, and documentation of the encounter.  Wilber Bihari, NP 10/16/21 10:32 AM Medical Oncology and Hematology [facility] [place], [place] [address] Tel. [phone]    Fax. [phone] *Total Encounter Time as defined by the Centers for Medicare and Medicaid Services includes, in addition to the face-to-face time of a patient visit (documented in the note above) non-face-to-face time: obtaining and reviewing outside history, ordering and reviewing medications, tests or procedures, care coordination (communications with other health care professionals or caregivers) and documentation in the medical record.

## 2021-10-16 NOTE — Assessment & Plan Note (Signed)
Screening mammogram on 01/11/20 showed right breast calcifications, 0.6cm, and a left breast mass at the 11:30 position, 0.7cm.  Biopsy on 02/06/20 showed atypical lobular hyperplasia and calcifications in the right breast, and in the left breast, fibrocystic changes and no evidence of malignancy. Right lumpectomy on 05/01/20 with Dr. Marlou Starks: Atypical lobular hyperplasia.  Atypical lobular hyperplasia: This is characterized by abnormal cells that are filling part of the lobule. It appears to increase the risk of breast cancer. There is risk of both ipsilateral and contralateral breast cancers. The cumulative incidence of breast cancer is approximately 1 %/year.  TyrerCusick 10-year risk 11% and lifetime risk 35%  Risk Reduction: Tamoxifen  Tamoxifen Toxicities: Moderate to severe hot flashes: I encouraged her to take the tamoxifen at bedtime.  Right breast pain and discomfort: This is coupled with physical exam abnormalities.  So this pain is different than the pain that was being managed with gabapentin.  I placed orders for mammogram and ultrasound.  For the pain and recommended that she take 1 Aleve and 1 extra strength Tylenol 3 times a day as needed.  She has a 29-pack-year tobacco history and is 51 years old.  This qualifies her for lung cancer screening.  She would like for me to order this.

## 2021-10-21 ENCOUNTER — Encounter (HOSPITAL_COMMUNITY): Payer: Self-pay

## 2021-10-28 ENCOUNTER — Other Ambulatory Visit: Payer: Self-pay

## 2021-10-28 ENCOUNTER — Ambulatory Visit (HOSPITAL_COMMUNITY)
Admission: RE | Admit: 2021-10-28 | Discharge: 2021-10-28 | Disposition: A | Discharge status: Home / Self Care | Payer: Self-pay | Source: Ambulatory Visit | Attending: Adult Health | Admitting: Adult Health

## 2021-10-28 DIAGNOSIS — F17200 Nicotine dependence, unspecified, uncomplicated: Secondary | ICD-10-CM | POA: Insufficient documentation

## 2021-11-02 ENCOUNTER — Other Ambulatory Visit: Payer: Self-pay

## 2021-11-02 ENCOUNTER — Telehealth: Payer: Self-pay

## 2021-11-02 NOTE — Telephone Encounter (Signed)
-----   Message from Gardenia Phlegm, NP sent at 11/01/2021  8:51 PM EST ----- Lung cancer screening shows no cancer.  Recommend repeat in 1 year.  ----- Message ----- From: Interface, Rad Results In Sent: 10/30/2021  12:47 PM EST To: Gardenia Phlegm, NP

## 2021-11-02 NOTE — Telephone Encounter (Signed)
Called and spoke with pt, shared lung cancer screening negative per April Hudson.  Repeat screening in 1 year.  Pt verbalized understanding and thanks.

## 2021-11-25 ENCOUNTER — Other Ambulatory Visit: Payer: Self-pay

## 2022-01-04 ENCOUNTER — Ambulatory Visit: Payer: Self-pay

## 2022-01-04 NOTE — Telephone Encounter (Signed)
?  Chief Complaint: neck injury ?Symptoms: Pain at the base of neck. Legs feel tingly - may have lost consciousness. ?Frequency: Friday ?Pertinent Negatives: Patient denies  ?Disposition: '[x]'$ ED /'[]'$ Urgent Care (no appt availability in office) / '[]'$ Appointment(In office/virtual)/ '[]'$  [place] [facility]/ '[]'$ Home Care/ '[]'$ Refused Recommended Disposition /'[]'$ [place] [facility]/ '[]'$  Follow-up with PCP ?Additional Notes: Pt fell off the back of her sofa where she was standing. May have lost consciousness. Right side body swelling. Legs are tingling.  ?Reason for Disposition ? [1] Numbness, tingling, or burning of arms, upper back/chest or legs AND [2] not present now (i.e., completely resolved) ? ?Answer Assessment - Initial Assessment Questions ?1. MECHANISM: "How did the injury happen?" (e.g., fall, MVA, twisting injury; consider the possibility of domestic violence or elder abuse) ?    Standing on the back of sofa fixing the curtains ?2. ONSET: "When did the injury happen?" (e.g., minutes, hours, days) ?    Friday ?3. LOCATION: "What part of the neck is injured?" "Where does it hurt?" ?    Back of neck at the base ?4. PAIN SEVERITY: "How bad is the pain?" "Can you move the neck normally?" (Scale 1-10; or mild, moderate, severe) ?  - NO PAIN (0): no pain, or only slight stiffness  ?  - MILD (1-3): doesn't interfere with normal activities  ?  - MODERATE (4-7): interferes with normal activities or awakens from sleep  ?  - SEVERE (8-10):  excruciating pain, unable to do any normal activities   ?    8/10 ?5. CORD SYMPTOMS: "Any weakness or numbness of the arms or legs?" ?    Legs tingling ?6. SIZE: For cuts, bruises, or swelling, ask: "How large is it?" (e.g., inches or centimeters)  ?    no ?7. TETANUS: For any breaks in the skin, ask: "When was the last tetanus booster?" ?    no ?8. OTHER SYMPTOMS: "Do you have any other symptoms?" (e.g., headache) ?    HA ?9. PREGNANCY: "Is there any chance you are pregnant?" "When was  your last menstrual period?" ?    na ? ?Protocols used: Neck Injury-A-AH ? ?

## 2022-02-19 ENCOUNTER — Encounter: Payer: Self-pay | Admitting: Gastroenterology

## 2022-03-08 ENCOUNTER — Ambulatory Visit: Payer: Self-pay | Admitting: *Deleted

## 2022-03-08 NOTE — Telephone Encounter (Signed)
  Chief Complaint: left knee pain worsening , falls x 2  Symptoms: left knee pain and swelling, dull, sharp and burning at times. Using cane to walk. Legs "locks up" Frequency: " a while" Pertinent Negatives: Patient denies chest pain fever no difficulty breathing Disposition: '[]'$ ED /'[]'$ Urgent Care (no appt availability in office) / '[]'$ Appointment(In office/virtual)/ '[]'$  [place] [facility]/ '[]'$ Home Care/ '[]'$ Refused Recommended Disposition /'[x]'$ [place] [facility]/ '[]'$  Follow-up with PCP Additional Notes:   Patient requesting to be seen by PCP. No available appt. Recommended mobile bus. Patient on waitlist  for appt. Not sure if patient will go to [employer] .   Reason for Disposition  [1] Very swollen joint AND [2] no fever  Answer Assessment - Initial Assessment Questions 1. LOCATION and RADIATION: "Where is the pain located?"      Left knee  2. QUALITY: "What does the pain feel like?"  (e.g., sharp, dull, aching, burning)     Dull and sharp and burning at times  3. SEVERITY: "How bad is the pain?" "What does it keep you from doing?"   (Scale 1-10; or mild, moderate, severe)   -  MILD (1-3): doesn't interfere with normal activities    -  MODERATE (4-7): interferes with normal activities (e.g., work or school) or awakens from sleep, limping    -  SEVERE (8-10): excruciating pain, unable to do any normal activities, unable to walk     Moderate to severe, walking with cane, knee feels tights , buckles at time walking  4. ONSET: "When did the pain start?" "Does it come and go, or is it there all the time?"     A while ago  5. RECURRENT: "Have you had this pain before?" If Yes, ask: "When, and what happened then?"     Yes  6. SETTING: "Has there been any recent work, exercise or other activity that involved that part of the body?"      Na  7. AGGRAVATING FACTORS: "What makes the knee pain worse?" (e.g., walking, climbing stairs, running)     Walking  8. ASSOCIATED SYMPTOMS: "Is there any  swelling or redness of the knee?"     Swelling  9. OTHER SYMPTOMS: "Do you have any other symptoms?" (e.g., chest pain, difficulty breathing, fever, calf pain)     Left knee pain ,, "locks up' has fallen x 2  10. PREGNANCY: "Is there any chance you are pregnant?" "When was your last menstrual period?"       na  Protocols used: Knee Pain-A-AH

## 2022-03-09 ENCOUNTER — Telehealth: Payer: Self-pay | Admitting: Nurse Practitioner

## 2022-04-05 ENCOUNTER — Telehealth: Payer: Self-pay | Admitting: *Deleted

## 2022-04-05 NOTE — Telephone Encounter (Signed)
Patient no showed PV today at 130 pm - Called patient and left message at 141 pm to return call by 5 pm today- If no call by 5 pm, PV and procedure will be canceled -   Lelan Pons PV

## 2022-04-05 NOTE — Telephone Encounter (Signed)
No call to RS at 5 pm- No show letter sent via My Chart  PV today and 7-21 [facility] colon canceled

## 2022-04-23 ENCOUNTER — Encounter: Payer: Self-pay | Admitting: Gastroenterology

## 2022-05-17 ENCOUNTER — Ambulatory Visit: Payer: Self-pay | Admitting: Orthopedic Surgery

## 2022-05-17 ENCOUNTER — Other Ambulatory Visit: Payer: Self-pay | Admitting: Hematology and Oncology

## 2022-05-20 ENCOUNTER — Ambulatory Visit: Payer: Self-pay | Admitting: Orthopedic Surgery

## 2022-07-13 ENCOUNTER — Ambulatory Visit: Payer: Self-pay | Admitting: *Deleted

## 2022-07-13 ENCOUNTER — Emergency Department (HOSPITAL_COMMUNITY)
Admission: EM | Admit: 2022-07-13 | Discharge: 2022-07-13 | Discharge status: Against Medical Advice | Payer: Commercial Managed Care - HMO

## 2022-07-13 NOTE — ED Notes (Signed)
Patient witnessed leaving department by registration staff.

## 2022-07-13 NOTE — Telephone Encounter (Signed)
  Chief Complaint: R shoulder injury Symptoms: swelling, unable to move normally Frequency: 2 days ago- patient fell Pertinent Negatives: Patient denies  cuts Disposition: '[x]'$ ED /'[]'$ Urgent Care (no appt availability in office) / '[]'$ Appointment(In office/virtual)/ '[]'$  [place] [facility]/ '[]'$ Home Care/ '[]'$ Refused Recommended Disposition /'[]'$ [place] [facility]/ '[]'$  Follow-up with PCP Additional Notes: Patient states her knee gave out which caused her to fall- she states she also has swelling in her knee.   Reason for Disposition  Can't move injured shoulder at all  Answer Assessment - Initial Assessment Questions 1. MECHANISM: "How did the injury happen?"     Patient knee locked and patient hit floor- R shoulder 2. ONSET: "When did the injury happen?" (Minutes or hours ago)      2 days ago 3. APPEARANCE of INJURY: "What does the injury look like?"      R shoulder swollen 4. SEVERITY: "Can you move the shoulder normally?"      no 5. SIZE: For cuts, bruises, or swelling, ask: "How large is it?" (e.g., inches or centimeters;  entire joint)      swelling 6. PAIN: "Is there pain?" If Yes, ask: "How bad is the pain?"   (e.g., Scale 1-10; or mild, moderate, severe)   - MILD (1-3): doesn't interfere with normal activities   - MODERATE (4-7): interferes with normal activities (e.g., work or school) or awakens from sleep   - SEVERE (8-10): excruciating pain, unable to do any normal activities, unable to move arm at all due to pain     Severe-8 7. TETANUS: For any breaks in the skin, ask: "When was the last tetanus booster?"      N/a 8. OTHER SYMPTOMS: "Do you have any other symptoms?" (e.g., loss of sensation)     Hand tingles at times and has sharp pian through breast at times 9. PREGNANCY: "Is there any chance you are pregnant?" "When was your last menstrual period?"  Protocols used: Shoulder Injury-A-AH

## 2022-08-27 ENCOUNTER — Other Ambulatory Visit: Payer: Self-pay | Admitting: Hematology and Oncology

## 2022-10-09 ENCOUNTER — Emergency Department (HOSPITAL_COMMUNITY)
Admission: EM | Admit: 2022-10-09 | Discharge: 2022-10-10 | Disposition: A | Discharge status: Home / Self Care | Payer: Medicaid Other | Attending: Emergency Medicine | Admitting: Emergency Medicine

## 2022-10-09 ENCOUNTER — Other Ambulatory Visit: Payer: Self-pay

## 2022-10-09 ENCOUNTER — Emergency Department (HOSPITAL_COMMUNITY): Payer: Medicaid Other

## 2022-10-09 DIAGNOSIS — Z20822 Contact with and (suspected) exposure to covid-19: Secondary | ICD-10-CM | POA: Diagnosis not present

## 2022-10-09 DIAGNOSIS — Z853 Personal history of malignant neoplasm of breast: Secondary | ICD-10-CM | POA: Diagnosis not present

## 2022-10-09 DIAGNOSIS — J208 Acute bronchitis due to other specified organisms: Secondary | ICD-10-CM | POA: Diagnosis not present

## 2022-10-09 DIAGNOSIS — R509 Fever, unspecified: Secondary | ICD-10-CM | POA: Diagnosis not present

## 2022-10-09 DIAGNOSIS — Z0389 Encounter for observation for other suspected diseases and conditions ruled out: Secondary | ICD-10-CM | POA: Diagnosis not present

## 2022-10-09 DIAGNOSIS — R059 Cough, unspecified: Secondary | ICD-10-CM | POA: Diagnosis not present

## 2022-10-09 DIAGNOSIS — R918 Other nonspecific abnormal finding of lung field: Secondary | ICD-10-CM | POA: Diagnosis not present

## 2022-10-09 DIAGNOSIS — R5383 Other fatigue: Secondary | ICD-10-CM | POA: Diagnosis not present

## 2022-10-09 DIAGNOSIS — B349 Viral infection, unspecified: Secondary | ICD-10-CM | POA: Diagnosis not present

## 2022-10-09 DIAGNOSIS — M79604 Pain in right leg: Secondary | ICD-10-CM | POA: Diagnosis not present

## 2022-10-09 DIAGNOSIS — J209 Acute bronchitis, unspecified: Secondary | ICD-10-CM | POA: Insufficient documentation

## 2022-10-09 DIAGNOSIS — G4489 Other headache syndrome: Secondary | ICD-10-CM | POA: Diagnosis not present

## 2022-10-09 DIAGNOSIS — R0602 Shortness of breath: Secondary | ICD-10-CM | POA: Diagnosis not present

## 2022-10-09 LAB — CBC WITH DIFFERENTIAL/PLATELET
Abs Immature Granulocytes: 0.03 10*3/uL (ref 0.00–0.07)
Basophils Absolute: 0.1 10*3/uL (ref 0.0–0.1)
Basophils Relative: 1 %
Eosinophils Absolute: 0.1 10*3/uL (ref 0.0–0.5)
Eosinophils Relative: 1 %
HCT: 42.8 % (ref 36.0–46.0)
Hemoglobin: 14.4 g/dL (ref 12.0–15.0)
Immature Granulocytes: 0 %
Lymphocytes Relative: 37 %
Lymphs Abs: 3.6 10*3/uL (ref 0.7–4.0)
MCH: 32.1 pg (ref 26.0–34.0)
MCHC: 33.6 g/dL (ref 30.0–36.0)
MCV: 95.5 fL (ref 80.0–100.0)
Monocytes Absolute: 0.8 10*3/uL (ref 0.1–1.0)
Monocytes Relative: 9 %
Neutro Abs: 5.2 10*3/uL (ref 1.7–7.7)
Neutrophils Relative %: 52 %
Platelets: 196 10*3/uL (ref 150–400)
RBC: 4.48 MIL/uL (ref 3.87–5.11)
RDW: 13.2 % (ref 11.5–15.5)
WBC: 9.9 10*3/uL (ref 4.0–10.5)
nRBC: 0 % (ref 0.0–0.2)

## 2022-10-09 LAB — RESP PANEL BY RT-PCR (RSV, FLU A&B, COVID)  RVPGX2
Influenza A by PCR: NEGATIVE
Influenza B by PCR: NEGATIVE
Resp Syncytial Virus by PCR: NEGATIVE
SARS Coronavirus 2 by RT PCR: NEGATIVE

## 2022-10-09 LAB — BASIC METABOLIC PANEL
Anion gap: 10 (ref 5–15)
BUN: 11 mg/dL (ref 6–20)
CO2: 22 mmol/L (ref 22–32)
Calcium: 8.6 mg/dL — ABNORMAL LOW (ref 8.9–10.3)
Chloride: 105 mmol/L (ref 98–111)
Creatinine, Ser: 0.66 mg/dL (ref 0.44–1.00)
GFR, Estimated: >60 mL/min (ref >60)
Glucose, Bld: 102 mg/dL — ABNORMAL HIGH (ref 70–99)
Potassium: 3.4 mmol/L — ABNORMAL LOW (ref 3.5–5.1)
Sodium: 137 mmol/L (ref 135–145)

## 2022-10-09 MED ORDER — IPRATROPIUM-ALBUTEROL 0.5-2.5 (3) MG/3ML IN SOLN
3.0000 mL | Freq: Once | RESPIRATORY_TRACT | Status: AC
  Administered 2022-10-09: 3 mL via RESPIRATORY_TRACT
  Filled 2022-10-09: qty 3

## 2022-10-09 MED ORDER — METHYLPREDNISOLONE SODIUM SUCC 125 MG IJ SOLR
125.0000 mg | Freq: Once | INTRAMUSCULAR | Status: AC
  Administered 2022-10-09: 125 mg via INTRAVENOUS
  Filled 2022-10-09: qty 2

## 2022-10-09 NOTE — ED Triage Notes (Signed)
Pt bib ems with reports of having fever, chills, fatigue, cough, generalized body aches onset last night.

## 2022-10-09 NOTE — ED Provider Triage Note (Signed)
Emergency Medicine Provider Triage Evaluation Note  April Hudson , a 52 y.o. female  was evaluated in triage.  Pt complains of worsening URI symptoms over the last 2 to 3 days.  Includes productive cough, subjective fever and chills, body aches, nasal congestion.  Unknown recent sick contacts.  Also noted a new red, oval-shaped, painful area on her right back thigh that appeared last night.  Denies chest pain, shortness of breath, or N/V/D.  No Hx of asthma or COPD.  Review of Systems  Positive:  Negative: See above  Physical Exam  There were no vitals taken for this visit. Gen:   Awake, no distress, uncomfortable appearing Resp:  Normal effort  MSK:   Moves extremities without difficulty  Other:  Productive cough appreciated.  5-6 cm oval erythematous warm and tender patch appreciated on the right posterior mid thigh with clear borders.  Medical Decision Making  Medically screening exam initiated at 7:39 PM.  Appropriate orders placed.  April Hudson was informed that the remainder of the evaluation will be completed by another provider, this initial triage assessment does not replace that evaluation, and the importance of remaining in the ED until their evaluation is complete.     Prince Rome, PA-C 83/38/25 [id number]

## 2022-10-09 NOTE — ED Triage Notes (Signed)
Patient reports persistent productive cough with chest congestion and runny nose , she adds itchy hives at right posterior thigh.

## 2022-10-10 ENCOUNTER — Emergency Department (HOSPITAL_COMMUNITY): Payer: Medicaid Other

## 2022-10-10 ENCOUNTER — Emergency Department (HOSPITAL_BASED_OUTPATIENT_CLINIC_OR_DEPARTMENT_OTHER)
Admission: RE | Admit: 2022-10-10 | Discharge: 2022-10-10 | Disposition: A | Discharge status: Home / Self Care | Payer: Medicaid Other | Source: Ambulatory Visit | Attending: Emergency Medicine | Admitting: Emergency Medicine

## 2022-10-10 DIAGNOSIS — R52 Pain, unspecified: Secondary | ICD-10-CM

## 2022-10-10 DIAGNOSIS — Z0389 Encounter for observation for other suspected diseases and conditions ruled out: Secondary | ICD-10-CM | POA: Diagnosis not present

## 2022-10-10 DIAGNOSIS — R918 Other nonspecific abnormal finding of lung field: Secondary | ICD-10-CM | POA: Diagnosis not present

## 2022-10-10 MED ORDER — IOHEXOL 350 MG/ML SOLN
75.0000 mL | Freq: Once | INTRAVENOUS | Status: AC | PRN
  Administered 2022-10-10: 75 mL via INTRAVENOUS

## 2022-10-10 MED ORDER — APIXABAN 5 MG PO TABS
10.0000 mg | ORAL_TABLET | Freq: Once | ORAL | Status: AC
  Administered 2022-10-10: 10 mg via ORAL
  Filled 2022-10-10: qty 2

## 2022-10-10 MED ORDER — ALBUTEROL SULFATE HFA 108 (90 BASE) MCG/ACT IN AERS
1.0000 | INHALATION_SPRAY | Freq: Once | RESPIRATORY_TRACT | Status: AC
  Administered 2022-10-10: 1 via RESPIRATORY_TRACT
  Filled 2022-10-10: qty 6.7

## 2022-10-10 MED ORDER — PREDNISONE 10 MG PO TABS
30.0000 mg | ORAL_TABLET | Freq: Every day | ORAL | 0 refills | Status: AC
  Filled 2022-10-10: qty 15, 5d supply, fill #0

## 2022-10-10 NOTE — ED Provider Notes (Signed)
[facility] EMERGENCY DEPARTMENT Provider Note   CSN: [id number] Arrival date & time: 10/09/22  1820     History  Chief Complaint  Patient presents with   Cough     April Hudson is a 52 y.o. female.  HPI   Patient with medical history including left-sided breast cancer, GERD, presents with URI-like symptoms as well as right upper thigh tenderness.  Patient states this all started yesterday, states that she has been having congestion cough body aches, states she has felt slightly short of breath denies actual pleuritic chest pain or chest pain itself, she denies any stomach pains nausea or vomiting, she endorses that she is having right upper leg pain came on suddenly, pain has remained constant, she has no history of PEs or DVTs she is not on oral birth control no recent surgeries long immobilization.  She states that she had a mastectomy and she is currently not on chemo or radiation therapy at this time.  She has no cardiac history she has no other complaints.    Home Medications Prior to Admission medications   Medication Sig Start Date End Date Taking? Authorizing Provider  predniSONE (DELTASONE) 10 MG tablet Take 3 tablets (30 mg total) by mouth daily for 5 days. 10/10/22 10/15/22 Yes Marcello Fennel, PA-C  gabapentin (NEURONTIN) 300 MG capsule TAKE 1 CAPSULE(300 MG) BY MOUTH THREE TIMES DAILY 10/15/21   Nicholas Lose, MD  pregabalin (LYRICA) 50 MG capsule Take 1 capsule (50 mg total) by mouth 3 (three) times daily. 06/01/21   [name], NP  tamoxifen (NOLVADEX) 20 MG tablet TAKE 1 TABLET(20 MG) BY MOUTH DAILY 08/27/22   Nicholas Lose, MD  pantoprazole (PROTONIX) 40 MG tablet Take 1 tablet (40 mg total) by mouth 2 (two) times daily. Take before breakfast and dinner 01/24/20 04/23/20  Esterwood, Amy S, PA-C      Allergies    Sulfa antibiotics    Review of Systems   Review of Systems  Constitutional:  Negative for chills and fever.  Respiratory:   Positive for cough, shortness of breath and wheezing.   Cardiovascular:  Negative for chest pain.  Gastrointestinal:  Negative for abdominal pain.  Musculoskeletal:        Right upper leg pain  Neurological:  Negative for headaches.    Physical Exam Updated Vital Signs BP 101/62   Pulse 97   Temp 98.9 F (37.2 C)   Resp 19   SpO2 94%  Physical Exam Vitals and nursing note reviewed.  Constitutional:      General: She is not in acute distress.    Appearance: She is not ill-appearing.  HENT:     Head: Normocephalic and atraumatic.     Nose: No congestion.  Eyes:     Conjunctiva/sclera: Conjunctivae normal.  Cardiovascular:     Rate and Rhythm: Normal rate and regular rhythm.     Pulses: Normal pulses.     Heart sounds: No murmur heard.    No friction rub. No gallop.  Pulmonary:     Effort: No respiratory distress.     Breath sounds: Wheezing present. No rhonchi or rales.     Comments: No evidence of respiratory distress she is nontachypneic nonhypoxic, she does have a slightly tight sounding chest with expiratory wheezing present no rales or rhonchi noted. Abdominal:     Palpations: Abdomen is soft.     Tenderness: There is no abdominal tenderness. There is no right CVA tenderness or left CVA  tenderness.  Musculoskeletal:     Right lower leg: No edema.     Left lower leg: No edema.     Comments: Focused exam of the right leg was performed, she has noted erythematous macule noted on the right upper medial aspect of her thigh, about the size of a grapefruit, there is a palpable cord present warm to the touch no fluctuance or induration present, there is no unilateral leg swelling, no calf tenderness, she has 2+ dorsal pedal pulses.  Please see picture for full detail  Skin:    General: Skin is warm and dry.  Neurological:     Mental Status: She is alert.  Psychiatric:        Mood and Affect: Mood normal.        ED Results / Procedures / Treatments   Labs (all labs  ordered are listed, but only abnormal results are displayed) Labs Reviewed  BASIC METABOLIC PANEL - Abnormal; Notable for the following components:      Result Value   Potassium 3.4 (*)    Glucose, Bld 102 (*)    Calcium 8.6 (*)    All other components within normal limits  RESP PANEL BY RT-PCR (RSV, FLU A&B, COVID)  RVPGX2  CBC WITH DIFFERENTIAL/PLATELET    EKG EKG Interpretation  Date/Time:  Sunday October 10 2022 00:31:38 EST Ventricular Rate:  91 PR Interval:  144 QRS Duration: 76 QT Interval:  370 QTC Calculation: 455 R Axis:   73 Text Interpretation: Normal sinus rhythm Nonspecific ST and T wave abnormality Abnormal ECG When compared with ECG of 02-Dec-2019 11:28, No significant change was found Confirmed by Delora Fuel ([id number]) on 10/10/2022 12:49:53 AM  Radiology CT Angio Chest PE W and/or Wo Contrast  Result Date: 10/10/2022 CLINICAL DATA:  Pulmonary embolism (PE) suspected, high prob EXAM: CT ANGIOGRAPHY CHEST WITH CONTRAST TECHNIQUE: Multidetector CT imaging of the chest was performed using the standard protocol during bolus administration of intravenous contrast. Multiplanar CT image reconstructions and MIPs were obtained to evaluate the vascular anatomy. RADIATION DOSE REDUCTION: This exam was performed according to the departmental dose-optimization program which includes automated exposure control, adjustment of the mA and/or kV according to patient size and/or use of iterative reconstruction technique. CONTRAST:  34m OMNIPAQUE IOHEXOL 350 MG/ML SOLN COMPARISON:  10/28/2021 FINDINGS: Cardiovascular: Adequate opacification of the pulmonary arterial tree. No intraluminal filling defect identified to suggest acute pulmonary embolism. Central pulmonary arteries are of normal caliber. No significant coronary artery calcification. Cardiac size within normal limits. No pericardial effusion. No significant atherosclerotic calcification within the thoracic aorta. No aortic aneurysm.  Mediastinum/Nodes: No enlarged mediastinal, hilar, or axillary lymph nodes. Thyroid gland, trachea, and esophagus demonstrate no significant findings. Lungs/Pleura: Lungs are clear. No pleural effusion or pneumothorax. Upper Abdomen: No acute abnormality. Musculoskeletal: No chest wall abnormality. No acute or significant osseous findings. Review of the MIP images confirms the above findings. IMPRESSION: 1. No pulmonary embolism. No acute intrathoracic pathology identified. Electronically Signed   By: AFidela SalisburyM.D.   On: 10/10/2022 00:38   DG Chest 2 View  Result Date: 10/09/2022 CLINICAL DATA:  Productive cough. EXAM: CHEST - 2 VIEW COMPARISON:  CT chest dated October 28, 2021. FINDINGS: The heart size and mediastinal contours are within normal limits. Both lungs are clear. The visualized skeletal structures are unremarkable. IMPRESSION: No active cardiopulmonary disease. Electronically Signed   By: IKeane PoliceD.O.   On: 10/09/2022 20:32    Procedures Procedures    Medications  Ordered in ED Medications  apixaban (ELIQUIS) tablet 10 mg (has no administration in time range)  ipratropium-albuterol (DUONEB) 0.5-2.5 (3) MG/3ML nebulizer solution 3 mL (3 mLs Nebulization Given 10/09/22 2355)  methylPREDNISolone sodium succinate (SOLU-MEDROL) 125 mg/2 mL injection 125 mg (125 mg Intravenous Given 10/09/22 2354)  iohexol (OMNIPAQUE) 350 MG/ML injection 75 mL (75 mLs Intravenous Contrast Given 10/10/22 0028)  albuterol (VENTOLIN HFA) 108 (90 Base) MCG/ACT inhaler 1 puff (1 puff Inhalation Given 10/10/22 0106)    ED Course/ Medical Decision Making/ A&P                           Medical Decision Making Amount and/or Complexity of Data Reviewed Radiology: ordered.  Risk Prescription drug management.   This patient presents to the ED for concern of URI-like symptoms leg pain, this involves an extensive number of treatment options, and is a complaint that carries with it a high risk of complications  and morbidity.  The differential diagnosis includes PE, DVT, URI, ACS    Additional history obtained:  Additional history obtained from N/A External records from outside source obtained and reviewed including oncology notes   Co morbidities that complicate the patient evaluation  Right breast neoplasm  Social Determinants of Health:  Current smoker    Lab Tests:  I Ordered, and personally interpreted labs.  The pertinent results include: CBC is unremarkable, BMP shows potassium 3.4, glucose 102, Rester panel negative   Imaging Studies ordered:  I ordered imaging studies including chest x-ray, CT of chest I independently visualized and interpreted imaging which showed CT and chest x-ray both negative acute findings I agree with the radiologist interpretation   Cardiac Monitoring:  The patient was maintained on a cardiac monitor.  I personally viewed and interpreted the cardiac monitored which showed an underlying rhythm of: Without signs of ischemia   Medicines ordered and prescription drug management:  I ordered medication including Solu-Medrol, bronchodilators I have reviewed the patients home medicines and have made adjustments as needed  Critical Interventions:  N/A   Reevaluation: Presents with URI-like symptoms, triage obtain basic lab work imaging which I personally reviewed they are unremarkable, on my exam she did have a palpable cord in her right upper thigh, seems consistent with phlebitis and/or DVT, her vital signs are slightly concerning she is borderline tachycardic and has an O2 sat in the lower 90s, due to this presentation I am concerned for possible PE but my suspicion is more viral bronchitis, will send down for CTA of chest for further evaluation provide with bronchodilators and steroids.  Reassessed patient after bronchodilators and steroids, lung sounds have improved, discussed potential of starting on anticoag for possible DVT, all risk and  benefits were discussed all answers for questions, she is agreement this plan and is ready for discharge.      Consultations Obtained:  N/A    Test Considered:  N/A    Rule out I have low suspicion for ACS as history is atypical, patient has no cardiac history, EKG was sinus rhythm without signs of ischemia.  Low suspicion for PE CT imaging is negative for these findings.  Low suspicion for pneumonia as imagings are negative.  My suspicion for cellulitis of the right upper leg is low as presentation is atypical, as there is no breakage in skin, no known trauma, seems more consistent with DVT or phlebitis.  Low suspicion for systemic infection as patient is nontoxic-appearing, vital signs reassuring, no  obvious source infection noted on exam.    Dispostion and problem list  After consideration of the diagnostic results and the patients response to treatment, I feel that the patent would benefit from discharge.   URI-like symptoms-suspect she is suffering from viral bronchitis, will defer on antibiotics she has a nonproductive cough, she has no leukocytosis, no evidence of consolidation seen on chest x-ray, will provide her with steroids and bronchodilators follow-up with PCP Right leg pain-concern for possible DVT versus phlebitis, will provide 1 dose of anticoag's today, will have her come back tomorrow for DVT study, explained that if she is in any type of trauma i.e. hitting her head car accident she is come back in for reassessment she has increased of bleeding.            Final Clinical Impression(s) / ED Diagnoses Final diagnoses:  Viral bronchitis  Right leg pain    Rx / DC Orders ED Discharge Orders          Ordered    predniSONE (DELTASONE) 10 MG tablet  Daily        10/10/22 0108    LE VENOUS        10/10/22 0108              Marcello Fennel, PA-C 35/00/93 [id number]    Delora Fuel, MD 99/37/16 [phone]

## 2022-10-10 NOTE — Progress Notes (Signed)
VASCULAR LAB    Right lower extremity venous duplex has been performed.  See CV proc for preliminary results.   Grasiela Jonsson, RVT 10/10/2022, 11:27 AM

## 2022-10-10 NOTE — Discharge Instructions (Signed)
Cough congestion-likely this is from a viral infection, I have given you an inhaler please use every every 4-6 hours 1 to 2 puffs as needed for wheezing.   also given you steroids please take as prescribed.  You may use over-the-counter pain medications like ibuprofen, Tylenol for pain and or fever control, you may also use Flonase, Mucinex, Claritin for decongestion.  Please follow with your primary care doctor in a week's time if symptoms or not improving Right leg pain-concerned for possible blood clot, I have given you a dose of Eliquis this is a blood thinner, which means that you are at increased risk for bleeding, if you are in any type of trauma within the next 24 hours you need to come back in for reassessment.  I would like you to come back tomorrow morning for a DVT study please review the discharge paperwork for instructions.   Come back to the emergency department if you develop chest pain, shortness of breath, severe abdominal pain, uncontrolled nausea, vomiting, diarrhea.

## 2022-10-11 ENCOUNTER — Other Ambulatory Visit: Payer: Self-pay

## 2022-10-18 ENCOUNTER — Other Ambulatory Visit: Payer: Self-pay

## 2022-10-19 ENCOUNTER — Other Ambulatory Visit: Payer: Self-pay | Admitting: *Deleted

## 2022-10-19 DIAGNOSIS — Z9189 Other specified personal risk factors, not elsewhere classified: Secondary | ICD-10-CM

## 2022-10-19 NOTE — Assessment & Plan Note (Deleted)
Screening mammogram on 01/11/20 showed right breast calcifications, 0.6cm, and a left breast mass at the 11:30 position, 0.7cm.  Biopsy on 02/06/20 showed atypical lobular hyperplasia and calcifications in the right breast, and in the left breast, fibrocystic changes and no evidence of malignancy.  Right lumpectomy on 05/01/20 with Dr. Marlou Starks: Atypical lobular hyperplasia.  Tyrer Cusick 10-year risk 11% and lifetime risk 35%   Risk Reduction: Tamoxifen  Tamoxifen Toxicities: Moderate to severe hot flashes: I encouraged her to take the tamoxifen at bedtime.   Right breast pain and discomfort: On gabapentin 300 mg p.o. 3 times daily.  Breast Cancer Surveillance:  Annual mammograms breast MRI because a lifetime risk of breast cancer risk of more than 20%:    Follow-up in 1 year.

## 2022-10-20 ENCOUNTER — Inpatient Hospital Stay: Payer: Medicaid Other

## 2022-10-20 ENCOUNTER — Inpatient Hospital Stay: Payer: Medicaid Other | Attending: Hematology and Oncology | Admitting: Hematology and Oncology

## 2022-10-20 DIAGNOSIS — N6091 Unspecified benign mammary dysplasia of right breast: Secondary | ICD-10-CM

## 2022-10-20 NOTE — Progress Notes (Incomplete)
   Patient Care Team: [name], NP as PCP - General (Nurse Practitioner)  DIAGNOSIS:  Encounter Diagnosis  Name Primary?   Atypical lobular hyperplasia (ALH) of right breast Yes    SUMMARY OF ONCOLOGIC HISTORY: Oncology History   No history exists.    CHIEF COMPLIANT: Follow-up of high risk for breast cancer on tamoxifen   INTERVAL HISTORY: April Hudson is a 52 y.o. with above-mentioned history of high risk for breast for breast cancer currently on risk reduction with tamoxifen. She presents to the clinic for a follow-up.    ALLERGIES:  is allergic to sulfa antibiotics.  MEDICATIONS:  Current Outpatient Medications  Medication Sig Dispense Refill   gabapentin (NEURONTIN) 300 MG capsule TAKE 1 CAPSULE(300 MG) BY MOUTH THREE TIMES DAILY 90 capsule 6   pregabalin (LYRICA) 50 MG capsule Take 1 capsule (50 mg total) by mouth 3 (three) times daily. 90 capsule 0   tamoxifen (NOLVADEX) 20 MG tablet TAKE 1 TABLET(20 MG) BY MOUTH DAILY 90 tablet 0   No current facility-administered medications for this visit.    PHYSICAL EXAMINATION: ECOG PERFORMANCE STATUS: {CHL ONC ECOG PS:[id number]}  There were no vitals filed for this visit. There were no vitals filed for this visit.  BREAST:*** No palpable masses or nodules in either right or left breasts. No palpable axillary supraclavicular or infraclavicular adenopathy no breast tenderness or nipple discharge. (exam performed in the presence of a chaperone)  LABORATORY DATA:  I have reviewed the data as listed    Latest Ref Rng & Units 10/09/2022    7:49 PM 12/18/2019    8:59 AM 12/02/2019   11:31 AM  CMP  Glucose 70 - 99 mg/dL 102  87  89   BUN 6 - 20 mg/dL 11  7  <5   Creatinine 0.44 - 1.00 mg/dL 0.66  0.83  0.66   Sodium 135 - 145 mmol/L 137  144  142   Potassium 3.5 - 5.1 mmol/L 3.4  4.1  3.5   Chloride 98 - 111 mmol/L 105  106  108   CO2 22 - 32 mmol/L '22  24  22   '$ Calcium 8.9 - 10.3 mg/dL 8.6  9.4  9.4   Total  Protein 6.0 - 8.5 g/dL  6.7    Total Bilirubin 0.0 - 1.2 mg/dL  0.3    Alkaline Phos 39 - 117 IU/L  64    AST 0 - 40 IU/L  18    ALT 0 - 32 IU/L  10      Lab Results  Component Value Date   WBC 9.9 10/09/2022   HGB 14.4 10/09/2022   HCT 42.8 10/09/2022   MCV 95.5 10/09/2022   PLT 196 10/09/2022   NEUTROABS 5.2 10/09/2022    ASSESSMENT & PLAN:  No problem-specific Assessment & Plan notes found for this encounter.    No orders of the defined types were placed in this encounter.  The patient has a good understanding of the overall plan. she agrees with it. she will call with any problems that may develop before the next visit here. Total time spent: 30 mins including face to face time and time spent for planning, charting and co-ordination of care   Suzzette Righter, [place] 10/20/22    I Gardiner Coins am acting as a Education administrator for [employer]  ***

## 2022-11-19 ENCOUNTER — Ambulatory Visit: Payer: Self-pay

## 2022-11-19 NOTE — Telephone Encounter (Signed)
    Chief Complaint: Right shoulder and neck pain. Asking to be worked in with PCP.  Symptoms: Above  Frequency: [place] in October Pertinent Negatives: Patient denies  Disposition: []$ ED /[]$ Urgent Care (no appt availability in office) / []$ Appointment(In office/virtual)/ []$  [facility]/ []$ Home Care/ []$ Refused Recommended Disposition /[]$ [facility]/ [x]$  Follow-up with PCP Additional Notes: Please advise pt.   Answer Assessment - Initial Assessment Questions 1. ONSET: "When did the pain start?"     October 2. LOCATION: "Where is the pain located?"     Right shoulder and neck 3. PAIN: "How bad is the pain?" (Scale 1-10; or mild, moderate, severe)   - MILD (1-3): doesn't interfere with normal activities   - MODERATE (4-7): interferes with normal activities (e.g., work or school) or awakens from sleep   - SEVERE (8-10): excruciating pain, unable to do any normal activities, unable to move arm at all due to pain     7 4. WORK OR EXERCISE: "Has there been any recent work or exercise that involved this part of the body?"     No 5. CAUSE: "What do you think is causing the shoulder pain?"     Fall 6. OTHER SYMPTOMS: "Do you have any other symptoms?" (e.g., neck pain, swelling, rash, fever, numbness, weakness)     Pain, swelling 7. PREGNANCY: "Is there any chance you are pregnant?" "When was your last menstrual period?"     No  Protocols used: Shoulder Pain-A-AH

## 2022-11-19 NOTE — Telephone Encounter (Signed)
Call placed to patient unable to reach message left on VM.

## 2022-11-23 NOTE — Telephone Encounter (Signed)
Appointment given for 11/29/2022 with Genice Rouge

## 2022-11-29 ENCOUNTER — Ambulatory Visit (INDEPENDENT_AMBULATORY_CARE_PROVIDER_SITE_OTHER): Payer: Medicaid Other | Admitting: Primary Care

## 2022-11-29 ENCOUNTER — Encounter (INDEPENDENT_AMBULATORY_CARE_PROVIDER_SITE_OTHER): Payer: Self-pay | Admitting: Primary Care

## 2022-11-29 VITALS — BP 106/72 | HR 90 | Resp 16 | Ht 61.5 in | Wt 139.4 lb

## 2022-11-29 DIAGNOSIS — M25511 Pain in right shoulder: Secondary | ICD-10-CM

## 2022-11-29 DIAGNOSIS — W19XXXA Unspecified fall, initial encounter: Secondary | ICD-10-CM | POA: Diagnosis not present

## 2022-11-29 NOTE — Progress Notes (Signed)
[place]        April Hudson is a 52 y.o. female  Shoulder Pain: Patient complaints of right shoulder pain. This is evaluated as a personal injury. The pain is described as aching and throbbing. Pain scale 7/10. The onset of the pain was  secondary to a fall 3 months ago . Patient went to the ED and stayed 2 hours so she left .  The pain occurs continuously and lasts 24 hours.  Location is acromioclavicular joint. No history of dislocation. Symptoms are aggravated by reaching, lifting, pulling, pushing, carrying, twisting, repetitive use, dressing self, difficulty sleeping on affected side. Symptoms are diminisohed by  medication: NSAID, Tylenol used but not effective.   Limited activities include: reaching, lifting, carrying, repetitive use.  Crepitus and popping felt in right subarchromial region noted is reported. Patient is not working. Patient has No headache, No chest pain, No abdominal pain - No Nausea, No new weakness tingling or numbness, No Cough . She has had a cold for 6 weeks causing shortness of breath, smokes 1/2 ppd , OTC Robitussin and cough drops  . Past Medical History:  Diagnosis Date   Carpal tunnel syndrome of right wrist    Ganglion cyst of wrist, right    GERD (gastroesophageal reflux disease)    History of adenomatous polyp of colon    tubular adenoma's 09/ 2016;  06/ 2017   History of Clostridium difficile colitis 05/21/2016   History of frequent urinary tract infections    History of gastric ulcer    clinical diagnosis, no prior EGD   History of uterine fibroid    Left breast mass    Migraines    OA (osteoarthritis)    Smoker    1ppd   Past Surgical History:  Procedure Laterality Date   BREAST LUMPECTOMY WITH RADIOACTIVE SEED LOCALIZATION Left 05/01/2020   Procedure: LEFT BREAST LUMPECTOMY X 2 WITH RADIOACTIVE SEED LOCALIZATION;  Surgeon: Jovita Kussmaul, MD;  Location: [facility];  Service: General;  Laterality:  Left;   CARPAL TUNNEL RELEASE Right 01/13/2017   Procedure: CARPAL TUNNEL RELEASE;  Surgeon: Iran Planas, MD;  Location: [facility];  Service: Orthopedics;  Laterality: Right;   COLONOSCOPY  last one 01/ 03/ 2018   sigmoid scope   GANGLION CYST EXCISION Right 01/13/2017   Procedure: REMOVAL GANGLION OF WRIST;  Surgeon: Iran Planas, MD;  Location: [facility];  Service: Orthopedics;  Laterality: Right;   KNEE ARTHROSCOPY Left ~ 2015   VAGINAL HYSTERECTOMY  2006    Current Outpatient Medications:    gabapentin (NEURONTIN) 300 MG capsule, TAKE 1 CAPSULE(300 MG) BY MOUTH THREE TIMES DAILY, Disp: 90 capsule, Rfl: 6   pregabalin (LYRICA) 50 MG capsule, Take 1 capsule (50 mg total) by mouth 3 (three) times daily., Disp: 90 capsule, Rfl: 0   tamoxifen (NOLVADEX) 20 MG tablet, TAKE 1 TABLET(20 MG) BY MOUTH DAILY, Disp: 90 tablet, Rfl: 0 Allergies  Allergen Reactions   Sulfa Antibiotics Hives and Rash    reports that she has been smoking cigarettes. She has a 29.00 pack-year smoking history. She has never used smokeless tobacco. She reports that she does not drink alcohol and does not use drugs. Family History  Problem Relation Age of Onset   Diabetes Mother    Hypertension Mother    Ovarian cancer Mother    Stomach cancer Maternal Uncle    Heart disease Sister    Healthy Brother  Asthma Son    Asthma Daughter    Colon cancer Maternal Uncle 58   Colon polyps Neg Hx    Esophageal cancer Neg Hx    Rectal cancer Neg Hx    Blood Pressure 106/72   Pulse 90   Respiration 16   Height 5' 1.5" (1.562 m)   Weight 139 lb 6.4 oz (63.2 kg)   Oxygen Saturation 96%   Body Mass Index 25.91 kg/m   Comprehensive ROS Pertinent positive and negative noted in HPI   General: Mild  distress. Eyes: Extraocular eye movements intact, pupils equal and round. Left ear canal bleeding - uses finger nail to scratch when itching. Neck: Supple, trachea midline. Thyroid: No  enlargement, mobile without fixation, no tenderness. Cardiovascular: Regular rhythm and rate, no murmur, normal radial pulses. Respiratory: Normal respiratory effort, clear to auscultation. Gastrointestinal: Normal pitch active bowel sounds, nontender abdomen without distention or appreciable hepatomegaly. Musculoskeletal:Palpation is with tenderness over AC joint or bicipital groove. Crepitus and popping  Mental status: Alert, conversant, speech clear, thought logical, appropriate mood and affect, no hallucinations or delusions evident. Hematologic/lymphatic: No cervical adenopathy, no visible ecchymoses.    [name] was seen today for neck pain and shoulder pain.  Diagnoses and all orders for this visit:  Right shoulder pain, unspecified chronicity 2/2 Fall, initial encounter See HPI  -     Ambulatory referral to Orthopedic Surgery      This note has been created with Surveyor, quantity. Any transcriptional errors are unintentional.   Kerin Perna, NP 11/29/2022, 10:44 AM

## 2022-11-29 NOTE — Patient Instructions (Signed)
Shoulder Pain Many things can cause shoulder pain, including: An injury. Moving the shoulder in the same way again and again (overuse). Joint pain (arthritis). Pain can come from: Swelling and irritation (inflammation) of any part of the shoulder. An injury to: The shoulder joint. Tissues that connect muscle to bone (tendons). Tissues that connect bones to each other (ligaments). Bones. Follow these instructions at home: Watch for changes in your symptoms. Let your doctor know about them. Follow these instructions to help with your pain. If you have a sling that can be taken off: Wear the sling as told by your doctor. Take it off only as told by your doctor. Check the skin around the sling every day. Tell your doctor if you see problems. Loosen the sling if your fingers: Tingle. Become numb. Become cold. Keep the sling clean. If the sling is not waterproof: Do not let it get wet. Take the sling off when you shower or bathe. Managing pain, stiffness, and swelling  If told, put ice on the painful area. Put ice in a plastic bag. Place a towel between your skin and the bag. Leave the ice on for 20 minutes, 2-3 times a day. Stop putting ice on if it does not help with the pain. If your skin turns bright red, take off the ice right away to prevent skin damage. The risk of damage is higher if you cannot feel pain, heat, or cold. Squeeze a soft ball or a foam pad as much as possible. This prevents swelling in the shoulder. It also helps to strengthen the arm. General instructions Take over-the-counter and prescription medicines only as told by your doctor. Keep all follow-up visits. This will help you avoid any type of permanent shoulder problems. Contact a doctor if: Your pain gets worse. Medicine does not help your pain. You have new pain in your arm, hand, or fingers. You loosen your sling and your arm, hand, or fingers: Tingle. Are numb. Are swollen. Get help right away  if: Your arm, hand, or fingers turn white or blue. This information is not intended to replace advice given to you by your health care provider. Make sure you discuss any questions you have with your health care provider. Document Revised: 04/23/2022 Document Reviewed: 04/23/2022 Elsevier Patient Education  [place].

## 2022-12-08 ENCOUNTER — Encounter: Payer: Self-pay | Admitting: Orthopaedic Surgery

## 2022-12-08 ENCOUNTER — Ambulatory Visit (INDEPENDENT_AMBULATORY_CARE_PROVIDER_SITE_OTHER): Payer: Medicaid Other

## 2022-12-08 ENCOUNTER — Ambulatory Visit (INDEPENDENT_AMBULATORY_CARE_PROVIDER_SITE_OTHER): Payer: Medicaid Other | Admitting: Orthopaedic Surgery

## 2022-12-08 ENCOUNTER — Telehealth: Payer: Self-pay

## 2022-12-08 ENCOUNTER — Other Ambulatory Visit: Payer: Self-pay

## 2022-12-08 DIAGNOSIS — G8929 Other chronic pain: Secondary | ICD-10-CM | POA: Diagnosis not present

## 2022-12-08 DIAGNOSIS — M25511 Pain in right shoulder: Secondary | ICD-10-CM

## 2022-12-08 MED ORDER — PREDNISONE 10 MG (21) PO TBPK
ORAL_TABLET | ORAL | 0 refills | Status: DC
  Filled 2022-12-08: qty 21, 6d supply, fill #0

## 2022-12-08 MED ORDER — METHOCARBAMOL 750 MG PO TABS
750.0000 mg | ORAL_TABLET | Freq: Three times a day (TID) | ORAL | 2 refills | Status: DC | PRN
  Filled 2022-12-08: qty 20, 7d supply, fill #0

## 2022-12-08 NOTE — Telephone Encounter (Signed)
Pt called and states she needs an appt with Dr Tammi Klippel because she was told by a friend she needs radiation for breast cancer.  Asked pt if she has been to an alternate medical facility and diagnosed with breast cancer. She states she was diagnosed by Dr Lindi Adie "a few years ago and he gave me a chemo pill." Educated pt that she is high risk for breast cancer and she is taking tamoxifen. She was educated on Tamoxifen and how it is not chemotherapy.  Pt was to have annual f/u 10/2021 and did not have that. She has not had a MM since 10/02/20. [place] reports some discomfort in her right breast. She was offered appt with Wilber Bihari, NP 12/16/22 at 1515 and she accepted this appt. She knows to arrive at 1500 for check in.

## 2022-12-08 NOTE — Progress Notes (Signed)
Office Visit Note   Patient: April Hudson           Date of Birth: Nov 06, 1970           MRN: [id number] Visit Date: 12/08/2022              Requested by: Kerin Perna, NP [place],  [place] [address] PCP: [name], NP   Assessment & Plan: Visit Diagnoses:  1. Chronic right shoulder pain     Plan: Impression is right-sided cervical strain.  At this point, I would like to start the patient on a steroid pack muscle relaxer and physical therapy.  Referral to PT made.  She is agreeable to this plan.  Follow-up as needed.  Follow-Up Instructions: Return if symptoms worsen or fail to improve.   Orders:  Orders Placed This Encounter  Procedures   XR Shoulder Right   XR Cervical Spine 2 or 3 views   Ambulatory referral to Physical Therapy   Meds ordered this encounter  Medications   predniSONE (STERAPRED UNI-PAK 21 TAB) 10 MG (21) TBPK tablet    Sig: Take as directed    Dispense:  21 tablet    Refill:  0   methocarbamol (ROBAXIN-750) 750 MG tablet    Sig: Take 1 tablet (750 mg total) by mouth 3 (three) times daily as needed for muscle spasms.    Dispense:  20 tablet    Refill:  2      Procedures: No procedures performed   Clinical Data: No additional findings.   Subjective: Chief Complaint  Patient presents with   Right Shoulder - Pain    HPI patient is a pleasant 52 year old female who comes in today with right shoulder pain for the past 3 months.  She notes that she sustained mechanical fall landing on her outstretched arm.  She has had pain to the top of the shoulder and lateral right neck since.  Pain is constant but worse with flexion extension of her neck.  She has taken Advil and gabapentin without relief.  She does note paresthesias to her index and long finger on the right.  No weakness. Review of Systems as detailed in HPI.  All others reviewed and are negative.   Objective: Vital Signs: There were no vitals taken  for this visit.  Physical Exam well-developed well-nourished female no acute distress.  Alert and oriented x 3.  Ortho Exam right shoulder exam reveals near full range of motion.  Full strength.  Cervical spine exam reveals no spinous tenderness.  She does have slight right-sided paraspinous tenderness.  Increased pain with cervical spine flexion and extension.  She is tender to the traps.  She is neurovascularly intact distally.  Specialty Comments:  No specialty comments available.  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   Tinea pedis 05/26/2015   Pain of left thumb 05/26/2015   Osteoarthritis of left wrist 04/29/2015   De Quervain's tenosynovitis, left 04/14/2015   Neck pain  on right side 01/13/2015   Carpal tunnel syndrome 12/30/2014   Smoker 07/03/2014   Family history of stomach cancer 07/03/2014   Past Medical History:  Diagnosis Date   Carpal tunnel syndrome of right wrist    Ganglion cyst of wrist, right    GERD (gastroesophageal reflux disease)    History of adenomatous polyp of colon    tubular adenoma's 09/ 2016;  06/ 2017   History of Clostridium difficile colitis 05/21/2016   History of frequent urinary tract infections    History of gastric ulcer    clinical diagnosis, no prior EGD   History of uterine fibroid    Left breast mass    Migraines    OA (osteoarthritis)    Smoker    1ppd    Family History  Problem Relation Age of Onset   Diabetes Mother    Hypertension Mother     Ovarian cancer Mother    Stomach cancer Maternal Uncle    Heart disease Sister    Healthy Brother    Asthma Son    Asthma Daughter    Colon cancer Maternal Uncle 69   Colon polyps Neg Hx    Esophageal cancer Neg Hx    Rectal cancer Neg Hx     Past Surgical History:  Procedure Laterality Date   BREAST LUMPECTOMY WITH RADIOACTIVE SEED LOCALIZATION Left 05/01/2020   Procedure: LEFT BREAST LUMPECTOMY X 2 WITH RADIOACTIVE SEED LOCALIZATION;  Surgeon: Jovita Kussmaul, MD;  Location: [facility];  Service: General;  Laterality: Left;   CARPAL TUNNEL RELEASE Right 01/13/2017   Procedure: CARPAL TUNNEL RELEASE;  Surgeon: Iran Planas, MD;  Location: [facility];  Service: Orthopedics;  Laterality: Right;   COLONOSCOPY  last one 01/ 03/ 2018   sigmoid scope   GANGLION CYST EXCISION Right 01/13/2017   Procedure: REMOVAL GANGLION OF WRIST;  Surgeon: Iran Planas, MD;  Location: [facility];  Service: Orthopedics;  Laterality: Right;   KNEE ARTHROSCOPY Left ~ 2015   VAGINAL HYSTERECTOMY  2006   Social History   Occupational History   Occupation: unemployed    Comment: Agricultural engineer  Tobacco Use   Smoking status: Every Day    Packs/day: 1.00    Years: 29.00    Total pack years: 29.00    Types: Cigarettes   Smokeless tobacco: Never  Vaping Use   Vaping Use: Never used  Substance and Sexual Activity   Alcohol use: No    Alcohol/week: 0.0 standard drinks of alcohol   Drug use: No   Sexual activity: Yes    Birth control/protection: Surgical

## 2022-12-09 ENCOUNTER — Other Ambulatory Visit: Payer: Self-pay

## 2022-12-16 ENCOUNTER — Inpatient Hospital Stay: Payer: Medicaid Other | Attending: Hematology and Oncology | Admitting: Adult Health

## 2022-12-27 ENCOUNTER — Encounter: Payer: Self-pay | Admitting: Nurse Practitioner

## 2022-12-27 ENCOUNTER — Other Ambulatory Visit: Payer: Self-pay

## 2022-12-27 ENCOUNTER — Ambulatory Visit: Payer: Medicaid Other | Attending: Nurse Practitioner | Admitting: Nurse Practitioner

## 2022-12-27 VITALS — BP 97/62 | HR 92 | Ht 61.5 in | Wt 134.8 lb

## 2022-12-27 DIAGNOSIS — F10230 Alcohol dependence with withdrawal, uncomplicated: Secondary | ICD-10-CM

## 2022-12-27 DIAGNOSIS — Z1211 Encounter for screening for malignant neoplasm of colon: Secondary | ICD-10-CM

## 2022-12-27 DIAGNOSIS — M79642 Pain in left hand: Secondary | ICD-10-CM | POA: Diagnosis not present

## 2022-12-27 DIAGNOSIS — F17200 Nicotine dependence, unspecified, uncomplicated: Secondary | ICD-10-CM | POA: Diagnosis not present

## 2022-12-27 DIAGNOSIS — E871 Hypo-osmolality and hyponatremia: Secondary | ICD-10-CM | POA: Diagnosis not present

## 2022-12-27 DIAGNOSIS — Z23 Encounter for immunization: Secondary | ICD-10-CM | POA: Diagnosis not present

## 2022-12-27 DIAGNOSIS — N6099 Unspecified benign mammary dysplasia of unspecified breast: Secondary | ICD-10-CM

## 2022-12-27 DIAGNOSIS — M79641 Pain in right hand: Secondary | ICD-10-CM

## 2022-12-27 MED ORDER — PREGABALIN 50 MG PO CAPS
50.0000 mg | ORAL_CAPSULE | Freq: Three times a day (TID) | ORAL | 1 refills | Status: AC
  Filled 2022-12-27: qty 90, 30d supply, fill #0

## 2022-12-27 NOTE — Progress Notes (Signed)
Assessment & Plan:  April Hudson was seen today for shoulder pain.  Diagnoses and all orders for this visit:  Carpal tunnel syndrome, bilateral -     pregabalin (LYRICA) 50 MG capsule; Take 1 capsule (50 mg total) by mouth 3 (three) times daily. -     Ambulatory referral to Hand Surgery  Breast atypical lobular hyperplasia -     MM 3D DIAGNOSTIC MAMMOGRAM BILATERAL BREAST; Future -     [place] LIMITED ULTRASOUND INCLUDING AXILLA RIGHT BREAST; Future  Tobacco dependence -     CT CHEST LUNG CA SCREEN LOW DOSE W/O CM; Future  Colon cancer screening -     Ambulatory referral to Gastroenterology  Need for shingles vaccine -     Cancel: Varicella-zoster vaccine IM  Alcohol dependence with uncomplicated withdrawal ([place]) She denies any recent alcohol consumption   Hyponatremia -     CMP14+EGFR    Patient has been counseled on age-appropriate routine health concerns for screening and prevention. These are reviewed and up-to-date. Referrals have been placed accordingly. Immunizations are up-to-date or declined.    Subjective:   Chief Complaint  Patient presents with   Shoulder Pain   [name] 52 y.o. female presents to office today with initial complaints of left knee pain, right shoulder pain and bilateral hand pain.  Patient has been counseled on age-appropriate routine health concerns for screening and prevention. These are reviewed and up-to-date. Referrals have been placed accordingly. Immunizations are up-to-date or declined.     She is currently being followed by Ortho for her shoulder pain and has been referred to physical therapy.  Since she has not heard from their office for scheduling I gave her the phone number to call for an appointment Mammogram: She has not had a mammogram in 3 years.  Has a history of atypical lobular hyperplasia of right breast and has been lost to follow-up with oncology which she states is due to transportation issues. She does continue to  smoke. Lung cancer screening CT ordered today COLONOSCOPY: Overdue for repeat colonoscopy.  Referral placed today.   Left knee pain Onset 3 months ago. Associated symptoms: swelling, popping, instability. Pain level 8/10 .  Unrelated to any injury or trauma.  Bilateral hand pain  She has a history of left hand De Quervain's tenosynovitis as well as carpal tunnel surgery on the right hand.  Notes difficulty holding objects in both hands with sharp pain shooting throughout both hands constantly.  She has received a cortisone injection in the left hand in the past.      Review of Systems  Constitutional:  Negative for fever, malaise/fatigue and weight loss.  HENT: Negative.  Negative for nosebleeds.   Eyes: Negative.  Negative for blurred vision, double vision and photophobia.  Respiratory: Negative.  Negative for cough and shortness of breath.   Cardiovascular: Negative.  Negative for chest pain, palpitations and leg swelling.  Gastrointestinal: Negative.  Negative for heartburn, nausea and vomiting.  Musculoskeletal:  Positive for joint pain. Negative for myalgias.  Neurological: Negative.  Negative for dizziness, focal weakness, seizures and headaches.  Psychiatric/Behavioral: Negative.  Negative for suicidal ideas.     Past Medical History:  Diagnosis Date   Allergy    Carpal tunnel syndrome of right wrist    Ganglion cyst of wrist, right    GERD (gastroesophageal reflux disease)    History of adenomatous polyp of colon    tubular adenoma's 09/ 2016;  06/ 2017   History of  Clostridium difficile colitis 05/21/2016   History of frequent urinary tract infections    History of gastric ulcer    clinical diagnosis, no prior EGD   History of uterine fibroid    Left breast mass    Migraines    OA (osteoarthritis)    Smoker    1ppd    Past Surgical History:  Procedure Laterality Date   BREAST LUMPECTOMY WITH RADIOACTIVE SEED LOCALIZATION Left 05/01/2020   Procedure: LEFT BREAST  LUMPECTOMY X 2 WITH RADIOACTIVE SEED LOCALIZATION;  Surgeon: Jovita Kussmaul, MD;  Location: [place];  Service: General;  Laterality: Left;   CARPAL TUNNEL RELEASE Right 01/13/2017   Procedure: CARPAL TUNNEL RELEASE;  Surgeon: Iran Planas, MD;  Location: [place];  Service: Orthopedics;  Laterality: Right;   COLONOSCOPY  last one 01/ 03/ 2018   sigmoid scope   GANGLION CYST EXCISION Right 01/13/2017   Procedure: REMOVAL GANGLION OF WRIST;  Surgeon: Iran Planas, MD;  Location: [place];  Service: Orthopedics;  Laterality: Right;   KNEE ARTHROSCOPY Left ~ 2015   VAGINAL HYSTERECTOMY  2006    Family History  Problem Relation Age of Onset   Diabetes Mother    Hypertension Mother    Ovarian cancer Mother    Stomach cancer Maternal Uncle    Heart disease Sister    Healthy Brother    Asthma Son    Asthma Daughter    Colon cancer Maternal Uncle 64   Colon polyps Neg Hx    Esophageal cancer Neg Hx    Rectal cancer Neg Hx     Social History Reviewed with no changes to be made today.   Outpatient Medications Prior to Visit  Medication Sig Dispense Refill   methocarbamol (ROBAXIN-750) 750 MG tablet Take 1 tablet (750 mg total) by mouth 3 (three) times daily as needed for muscle spasms. 20 tablet 2   gabapentin (NEURONTIN) 300 MG capsule TAKE 1 CAPSULE(300 MG) BY MOUTH THREE TIMES DAILY 90 capsule 6   predniSONE (STERAPRED UNI-PAK 21 TAB) 10 MG (21) TBPK tablet Take as directed 21 tablet 0   tamoxifen (NOLVADEX) 20 MG tablet TAKE 1 TABLET(20 MG) BY MOUTH DAILY (Patient not taking: Reported on 12/27/2022) 90 tablet 0   pregabalin (LYRICA) 50 MG capsule Take 1 capsule (50 mg total) by mouth 3 (three) times daily. (Patient not taking: Reported on 12/27/2022) 90 capsule 0   No facility-administered medications prior to visit.    Allergies  Allergen Reactions   Sulfa Antibiotics Hives and Rash   Sulfacetamide Sodium Hives        Objective:    BP 97/62   Pulse 92   Ht 5' 1.5" (1.562 m)   Wt 134 lb 12.8 oz (61.1 kg)   SpO2 97%   BMI 25.06 kg/m  Wt Readings from Last 3 Encounters:  12/27/22 134 lb 12.8 oz (61.1 kg)  11/29/22 139 lb 6.4 oz (63.2 kg)  10/16/21 148 lb 3.2 oz (67.2 kg)    Physical Exam Vitals and nursing note reviewed.  Constitutional:      Appearance: She is well-developed.  HENT:     Head: Normocephalic and atraumatic.  Cardiovascular:     Rate and Rhythm: Normal rate and regular rhythm.     Heart sounds: Normal heart sounds. No murmur heard.    No friction rub. No gallop.  Pulmonary:     Effort: Pulmonary effort is normal. No tachypnea or respiratory distress.  Breath sounds: Normal breath sounds. No decreased breath sounds, wheezing, rhonchi or rales.  Chest:     Chest wall: No tenderness.  Abdominal:     General: Bowel sounds are normal.     Palpations: Abdomen is soft.  Musculoskeletal:        General: Normal range of motion.     Cervical back: Normal range of motion.  Skin:    General: Skin is warm and dry.  Neurological:     Mental Status: She is alert and oriented to person, place, and time.     Coordination: Coordination normal.  Psychiatric:        Behavior: Behavior normal. Behavior is cooperative.        Thought Content: Thought content normal.        Judgment: Judgment normal.          Patient has been counseled extensively about nutrition and exercise as well as the importance of adherence with medications and regular follow-up. The patient was given clear instructions to go to ER or return to medical center if symptoms don't improve, worsen or new problems develop. The patient verbalized understanding.   Follow-up: Return in about 3 months (around 03/29/2023).   [name], FNP-BC [facility] and [place], [place]   12/27/2022, 1:43 PM

## 2022-12-27 NOTE — Patient Instructions (Addendum)
[facility] at [facility] [phone], [place] [address]   [facility] Located in: [facility] Address: [place], [place], [place] [address] Phone: [phone]   Dr. Thomasene Ripple Specialist [facility] Address: [address] [place], [place] [address] Phone: [phone]

## 2022-12-27 NOTE — Progress Notes (Signed)
Right breast pain Hands and left knee pain

## 2022-12-28 ENCOUNTER — Other Ambulatory Visit: Payer: Self-pay

## 2022-12-28 LAB — CMP14+EGFR
ALT: 6 IU/L (ref 0–32)
AST: 14 IU/L (ref 0–40)
Albumin/Globulin Ratio: 1.5 (ref 1.2–2.2)
Albumin: 4.1 g/dL (ref 3.8–4.9)
Alkaline Phosphatase: 62 IU/L (ref 44–121)
BUN/Creatinine Ratio: 13 (ref 9–23)
BUN: 11 mg/dL (ref 6–24)
Bilirubin Total: 0.4 mg/dL (ref 0.0–1.2)
CO2: 24 mmol/L (ref 20–29)
Calcium: 9.5 mg/dL (ref 8.7–10.2)
Chloride: 105 mmol/L (ref 96–106)
Creatinine, Ser: 0.85 mg/dL (ref 0.57–1.00)
Globulin, Total: 2.7 g/dL (ref 1.5–4.5)
Glucose: 93 mg/dL (ref 70–99)
Potassium: 3.9 mmol/L (ref 3.5–5.2)
Sodium: 145 mmol/L — ABNORMAL HIGH (ref 134–144)
Total Protein: 6.8 g/dL (ref 6.0–8.5)
eGFR: 82 mL/min/{1.73_m2} (ref >59)

## 2022-12-31 ENCOUNTER — Other Ambulatory Visit: Payer: Self-pay

## 2023-01-10 ENCOUNTER — Telehealth: Payer: Self-pay | Admitting: Hematology and Oncology

## 2023-01-10 NOTE — Telephone Encounter (Signed)
Scheduled appointment per scheduling message. Unable to leave a voicemail due to mailbox not being set up yet. Patient will be sent a reminder.

## 2023-02-01 ENCOUNTER — Inpatient Hospital Stay: Payer: Medicaid Other | Attending: Hematology and Oncology | Admitting: Hematology and Oncology

## 2023-02-01 ENCOUNTER — Ambulatory Visit (INDEPENDENT_AMBULATORY_CARE_PROVIDER_SITE_OTHER): Payer: Medicaid Other

## 2023-02-01 ENCOUNTER — Ambulatory Visit (INDEPENDENT_AMBULATORY_CARE_PROVIDER_SITE_OTHER): Payer: Medicaid Other | Admitting: Orthopaedic Surgery

## 2023-02-01 ENCOUNTER — Other Ambulatory Visit: Payer: Self-pay

## 2023-02-01 ENCOUNTER — Encounter: Payer: Self-pay | Admitting: Orthopaedic Surgery

## 2023-02-01 VITALS — BP 109/69 | HR 102 | Temp 97.5°F | Resp 18 | Ht 61.5 in | Wt 135.0 lb

## 2023-02-01 DIAGNOSIS — F17210 Nicotine dependence, cigarettes, uncomplicated: Secondary | ICD-10-CM | POA: Diagnosis not present

## 2023-02-01 DIAGNOSIS — G8929 Other chronic pain: Secondary | ICD-10-CM

## 2023-02-01 DIAGNOSIS — N6091 Unspecified benign mammary dysplasia of right breast: Secondary | ICD-10-CM | POA: Diagnosis not present

## 2023-02-01 DIAGNOSIS — R232 Flushing: Secondary | ICD-10-CM | POA: Diagnosis not present

## 2023-02-01 DIAGNOSIS — Z9189 Other specified personal risk factors, not elsewhere classified: Secondary | ICD-10-CM | POA: Insufficient documentation

## 2023-02-01 DIAGNOSIS — M25562 Pain in left knee: Secondary | ICD-10-CM | POA: Diagnosis not present

## 2023-02-01 DIAGNOSIS — M25532 Pain in left wrist: Secondary | ICD-10-CM | POA: Diagnosis not present

## 2023-02-01 DIAGNOSIS — Z79810 Long term (current) use of selective estrogen receptor modulators (SERMs): Secondary | ICD-10-CM | POA: Insufficient documentation

## 2023-02-01 MED ORDER — LIDOCAINE HCL 1 % IJ SOLN
2.0000 mL | INTRAMUSCULAR | Status: AC | PRN
  Administered 2023-02-01: 2 mL

## 2023-02-01 MED ORDER — BUPIVACAINE HCL 0.5 % IJ SOLN
2.0000 mL | INTRAMUSCULAR | Status: AC | PRN
  Administered 2023-02-01: 2 mL via INTRA_ARTICULAR

## 2023-02-01 MED ORDER — METHYLPREDNISOLONE ACETATE 40 MG/ML IJ SUSP
13.3300 mg | INTRAMUSCULAR | Status: AC | PRN
  Administered 2023-02-01: 13.33 mg

## 2023-02-01 MED ORDER — METHYLPREDNISOLONE ACETATE 40 MG/ML IJ SUSP
40.0000 mg | INTRAMUSCULAR | Status: AC | PRN
  Administered 2023-02-01: 40 mg via INTRA_ARTICULAR

## 2023-02-01 MED ORDER — LIDOCAINE HCL 1 % IJ SOLN
0.3000 mL | INTRAMUSCULAR | Status: AC | PRN
  Administered 2023-02-01: .3 mL

## 2023-02-01 MED ORDER — BUPIVACAINE HCL 0.5 % IJ SOLN
0.3300 mL | INTRAMUSCULAR | Status: AC | PRN
  Administered 2023-02-01: .33 mL

## 2023-02-01 NOTE — Progress Notes (Signed)
Patient Care Team: Claiborne Rigg, NP as PCP - General (Nurse Practitioner)  DIAGNOSIS:  Encounter Diagnosis  Name Primary?   Atypical lobular hyperplasia (ALH) of right breast Yes      CHIEF COMPLIANT: Follow-up on tamoxifen  INTERVAL HISTORY: April Hudson is a 52 y.o. with above-mentioned history of high risk for breast for breast cancer currently on risk reduction with tamoxifen. She reports that the hot flashes are very severe. She has severe pain in the hands and knees.    ALLERGIES:  is allergic to sulfa antibiotics and sulfacetamide sodium.  MEDICATIONS:  Current Outpatient Medications  Medication Sig Dispense Refill   methocarbamol (ROBAXIN-750) 750 MG tablet Take 1 tablet (750 mg total) by mouth 3 (three) times daily as needed for muscle spasms. 20 tablet 2   pregabalin (LYRICA) 50 MG capsule Take 1 capsule (50 mg total) by mouth 3 (three) times daily. 90 capsule 1   tamoxifen (NOLVADEX) 20 MG tablet TAKE 1 TABLET(20 MG) BY MOUTH DAILY 90 tablet 0   No current facility-administered medications for this visit.    PHYSICAL EXAMINATION: ECOG PERFORMANCE STATUS: 2 - Symptomatic, <50% confined to bed  Vitals:   02/01/23 1403  BP: 109/69  Pulse: (!) 102  Resp: 18  Temp: (!) 97.5 F (36.4 C)  SpO2: 100%   Filed Weights   02/01/23 1403  Weight: 135 lb (61.2 kg)      LABORATORY DATA:  I have reviewed the data as listed    Latest Ref Rng & Units 12/27/2022   10:21 AM 10/09/2022    7:49 PM 12/18/2019    8:59 AM  CMP  Glucose 70 - 99 mg/dL 93  161  87   BUN 6 - 24 mg/dL 11  11  7    Creatinine 0.57 - 1.00 mg/dL 0.96  0.45  4.09   Sodium 134 - 144 mmol/L 145  137  144   Potassium 3.5 - 5.2 mmol/L 3.9  3.4  4.1   Chloride 96 - 106 mmol/L 105  105  106   CO2 20 - 29 mmol/L 24  22  24    Calcium 8.7 - 10.2 mg/dL 9.5  8.6  9.4   Total Protein 6.0 - 8.5 g/dL 6.8   6.7   Total Bilirubin 0.0 - 1.2 mg/dL 0.4   0.3   Alkaline Phos 44 - 121 IU/L 62   64   AST 0  - 40 IU/L 14   18   ALT 0 - 32 IU/L 6   10     Lab Results  Component Value Date   WBC 9.9 10/09/2022   HGB 14.4 10/09/2022   HCT 42.8 10/09/2022   MCV 95.5 10/09/2022   PLT 196 10/09/2022   NEUTROABS 5.2 10/09/2022    ASSESSMENT & PLAN:  Atypical lobular hyperplasia (ALH) of right breast Screening mammogram on 01/11/20 showed right breast calcifications, 0.6cm, and a left breast mass at the 11:30 position, 0.7cm.  Biopsy on 02/06/20 showed atypical lobular hyperplasia and calcifications in the right breast, and in the left breast, fibrocystic changes and no evidence of malignancy.  Right lumpectomy on 05/01/20 with Dr. Carolynne Edouard: Atypical lobular hyperplasia.  Tyrer Cusick 10-year risk 11% and lifetime risk 35%   Risk Reduction: Tamoxifen  Tamoxifen Toxicities: Moderate to severe hot flashes: I recommended reducing the dosage of tamoxifen to 10 mg a day.   Right breast pain and discomfort: Gabapentin did not help and it was discontinued.  She has persistent  arthritis especially in the knee and the wrists.  She received injections today.  Breast Cancer Surveillance:  Annual mammograms, scheduled for 02/16/2023 breast MRI because a lifetime risk of breast cancer risk of more than 20%: We will set up for breast MRI in November.   Follow-up in 1 year.   Orders Placed This Encounter  Procedures   MR BREAST BILATERAL W WO CONTRAST INC CAD    Standing Status:   Future    Standing Expiration Date:   02/01/2024    Order Specific Question:   If indicated for the ordered procedure, I authorize the administration of contrast media per Radiology protocol    Answer:   Yes    Order Specific Question:   What is the patient's sedation requirement?    Answer:   No Sedation    Order Specific Question:   Does the patient have a pacemaker or implanted devices?    Answer:   No    Order Specific Question:   Preferred imaging location?    Answer:   [facility] (table limit-550lbs)   The patient  has a good understanding of the overall plan. she agrees with it. she will call with any problems that may develop before the next visit here. Total time spent: 30 mins including face to face time and time spent for planning, charting and co-ordination of care   April Meek, MD 02/01/23    I Janan Ridge am acting as a Neurosurgeon for [employer]  I have reviewed the above documentation for accuracy and completeness, and I agree with the above.

## 2023-02-01 NOTE — Progress Notes (Addendum)
Office Visit Note   Patient: April Hudson           Date of Birth: [birth year]           MRN: [id number] Visit Date: 02/01/2023              Requested by: Claiborne Rigg, NP [address] [place] [address] [place],  [place] [address] PCP: Claiborne Rigg, NP   Assessment & Plan: Visit Diagnoses:  1. Chronic pain of left knee   2. Pain in left wrist     Plan: Impression 52 year old female with chronic left knee and left wrist pain.  For the left knee she has fair amount of degenerative changes and joint space narrowing which is basically bone-on-bone in the medial compartment.  She would like to try a steroid injection today.  Also recommended knee compression sleeve.  For the left wrist that she does have a fair amount of DJD of the scaphoid trapezial joint.  Injection was performed there.  Will give her supportive CMC brace.  Follow-up if symptoms persist.  Follow-Up Instructions: No follow-ups on file.   Orders:  Orders Placed This Encounter  Procedures   Large Joint Inj: L knee   Hand/UE Inj: L thumb CMC   XR KNEE 3 VIEW LEFT   XR Wrist Complete Left   Meds ordered this encounter  Medications   bupivacaine (MARCAINE) 0.5 % (with pres) injection 0.33 mL   bupivacaine (MARCAINE) 0.5 % (with pres) injection 2 mL   lidocaine (XYLOCAINE) 1 % (with pres) injection 0.3 mL   lidocaine (XYLOCAINE) 1 % (with pres) injection 2 mL   methylPREDNISolone acetate (DEPO-MEDROL) injection 13.33 mg   methylPREDNISolone acetate (DEPO-MEDROL) injection 40 mg      Procedures: Large Joint Inj: L knee on 02/01/2023 11:19 AM Details: 22 G needle Medications: 2 mL bupivacaine 0.5 %; 2 mL lidocaine 1 %; 40 mg methylPREDNISolone acetate 40 MG/ML Outcome: tolerated well, no immediate complications Patient was prepped and draped in the usual sterile fashion.    Hand/UE Inj: L thumb CMC for osteoarthritis on 02/01/2023 11:19 AM Indications: pain Details: 25 G needle Medications: 0.3 mL  lidocaine 1 %; 0.33 mL bupivacaine 0.5 %; 13.33 mg methylPREDNISolone acetate 40 MG/ML Outcome: tolerated well, no immediate complications      Clinical Data: No additional findings.   Subjective: Chief Complaint  Patient presents with   Left Wrist - Pain   Left Knee - Pain    HPI  April Hudson is a very pleasant 52 year old female here for evaluation of 2 separate problems.  The first condition is left knee pain for about 8 to 9 years.  She had a surgery in [facility] and sounds like had a meniscal debridement.  She has had pain since the surgery.  Second problem is left wrist pain.  She has tried an injection in the past without much relief.  She has been doing home treatments.  Denies any recent injuries.  Review of Systems  Constitutional: Negative.   HENT: Negative.    Eyes: Negative.   Respiratory: Negative.    Cardiovascular: Negative.   Endocrine: Negative.   Musculoskeletal: Negative.   Neurological: Negative.   Hematological: Negative.   Psychiatric/Behavioral: Negative.    All other systems reviewed and are negative.    Objective: Vital Signs: There were no vitals taken for this visit.  Physical Exam Vitals and nursing note reviewed.  Constitutional:      Appearance: She is well-developed.  HENT:     Head: Atraumatic.     Nose: Nose normal.  Eyes:     Extraocular Movements: Extraocular movements intact.  Cardiovascular:     Pulses: Normal pulses.  Pulmonary:     Effort: Pulmonary effort is normal.  Abdominal:     Palpations: Abdomen is soft.  Musculoskeletal:     Cervical back: Neck supple.  Skin:    General: Skin is warm.     Capillary Refill: Capillary refill takes less than 2 seconds.  Neurological:     Mental Status: She is alert. Mental status is at baseline.  Psychiatric:        Behavior: Behavior normal.        Thought Content: Thought content normal.        Judgment: Judgment normal.     Ortho Exam  Examination  left knee shows trace effusion.  Medial joint line tenderness.  Collaterals and cruciates are stable.  Examination left wrist shows no pain or crepitus with grind test of the CMC.  Negative Finkelstein's.  Specialty Comments:  No specialty comments available.  Imaging: XR KNEE 3 VIEW LEFT  Result Date: 02/01/2023 Advanced tricompartmental degenerative joint disease.  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    PMFS History: Patient Active Problem List   Diagnosis Date Noted   Alcohol dependence with uncomplicated withdrawal (HCC) 12/27/2022   Atypical lobular hyperplasia [facility]) of right breast 06/18/2020   Influenza A 10/23/2017   Ganglion cyst of wrist, right 09/16/2016   Esophageal reflux 05/24/2016   Anal fissure 10/23/2015   Epidermoid cyst 09/02/2015   Rectal polyp,focal high grade dysplasia 06/27/2015   Tinea pedis 05/26/2015   Pain of left thumb 05/26/2015   Osteoarthritis of left wrist 04/29/2015   De Quervain's tenosynovitis, left 04/14/2015   Neck pain on right side 01/13/2015   Carpal tunnel syndrome 12/30/2014   Smoker 07/03/2014   Family history of stomach cancer 07/03/2014   Past Medical History:  Diagnosis Date   Allergy    Carpal tunnel syndrome of right wrist    Ganglion cyst of wrist, right    GERD (gastroesophageal reflux disease)    History of adenomatous polyp of colon    tubular adenoma's 09/ 2016;  06/ 2017   History of Clostridium difficile colitis 05/21/2016   History of frequent urinary tract infections    History of gastric ulcer    clinical diagnosis, no prior EGD   History of uterine fibroid    Left breast mass    Migraines    OA (osteoarthritis)    Smoker    1ppd    Family History  Problem Relation Age of Onset   Diabetes Mother    Hypertension Mother    Ovarian cancer Mother    Stomach cancer Maternal Uncle    Heart  disease Sister    Healthy Brother    Asthma Son    Asthma Daughter    Colon cancer Maternal Uncle 39   Colon polyps Neg Hx    Esophageal cancer Neg Hx    Rectal cancer Neg Hx     Past Surgical History:  Procedure Laterality Date   BREAST LUMPECTOMY WITH RADIOACTIVE SEED LOCALIZATION Left 05/01/2020   Procedure: LEFT BREAST LUMPECTOMY X 2 WITH RADIOACTIVE SEED LOCALIZATION;  Surgeon: Griselda Miner, MD;  [facility];  Service: General;  Laterality: Left;   CARPAL TUNNEL  RELEASE Right 01/13/2017   Procedure: CARPAL TUNNEL RELEASE;  Surgeon: Bradly Bienenstock, MD;  Location: [facility];  Service: Orthopedics;  Laterality: Right;   COLONOSCOPY  last one 01/ 03/ 2018   sigmoid scope   GANGLION CYST EXCISION Right 01/13/2017   Procedure: REMOVAL GANGLION OF WRIST;  Surgeon: Bradly Bienenstock, MD;  Location: [facility];  Service: Orthopedics;  Laterality: Right;   KNEE ARTHROSCOPY Left ~ 2015   VAGINAL HYSTERECTOMY  2006   Social History   Occupational History   Occupation: unemployed    Comment: Production manager  Tobacco Use   Smoking status: Every Day    Packs/day: 1.00    Years: 35.00    Additional pack years: 0.00    Total pack years: 35.00    Types: Cigarettes   Smokeless tobacco: Never  Vaping Use   Vaping Use: Never used  Substance and Sexual Activity   Alcohol use: No    Alcohol/week: 0.0 standard drinks of alcohol   Drug use: No   Sexual activity: Yes    Birth control/protection: Surgical

## 2023-02-01 NOTE — Assessment & Plan Note (Addendum)
Screening mammogram on 01/11/20 showed right breast calcifications, 0.6cm, and a left breast mass at the 11:30 position, 0.7cm.  Biopsy on 02/06/20 showed atypical lobular hyperplasia and calcifications in the right breast, and in the left breast, fibrocystic changes and no evidence of malignancy.  Right lumpectomy on 05/01/20 with Dr. Carolynne Edouard: Atypical lobular hyperplasia.  Tyrer Cusick 10-year risk 11% and lifetime risk 35%   Risk Reduction: Tamoxifen  Tamoxifen Toxicities: Moderate to severe hot flashes: I recommended reducing the dosage of tamoxifen to 10 mg a day.   Right breast pain and discomfort: Gabapentin did not help and it was discontinued.  She has persistent arthritis especially in the knee and the wrists.  She received injections today.  Breast Cancer Surveillance:  Annual mammograms, scheduled for 02/16/2023 breast MRI because a lifetime risk of breast cancer risk of more than 20%: We will set up for breast MRI in November.   Follow-up in 1 year.

## 2023-02-28 NOTE — Progress Notes (Unsigned)
Office Visit Note   Patient: April Hudson           Date of Birth: [birth year]           MRN: [id number] Visit Date: 03/01/2023              Requested by: Claiborne Rigg, NP [address] [place],  [place] [address] PCP: Claiborne Rigg, NP   Assessment & Plan: Visit Diagnoses:  1. Pain of left thumb   2. Primary osteoarthritis of left knee     Plan: Impression is a 52 year old female with end-stage left knee DJD with bone-on-bone joint space narrowing.  At this point she has elected to move forward with a left total knee replacement.  Risk benefits prognosis reviewed.  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  I will make referral to the hand center for further treatment options.  Impression is severe left knee degenerative joint disease secondary to Osteoarthritis.  Bone on bone joint space narrowing is seen on radiographs with mild varus alignment.  At this point, conservative treatments fail to provide any significant relief and the pain is severely affecting ADLs and quality of life.  Based on treatment options, the patient has elected to move forward with a knee replacement.  We have discussed the surgical risks that include but are not limited to infection, DVT, leg length discrepancy, stiffness, numbness, tingling, incomplete relief of pain.  Recovery and prognosis were also reviewed.    Underwent steroid injection on 02/01/2023.  Will schedule knee replacement for an appropriate period of time afterward the injection.  Anticoagulants: No antithrombotic Postop anticoagulation: Xarelto - on tamoxifen Diabetic: No  Nickel allergy: No Prior DVT/PE: No Tobacco use: Yes 1 ppd Clearances needed for surgery: Bertram [place] - PCP Anticipated discharge dispo: Home   Follow-Up Instructions: No follow-ups on file.   Orders:  Orders Placed This Encounter  Procedures   Ambulatory referral to Orthopedic Surgery   No orders of the defined types were  placed in this encounter.     Procedures: No procedures performed   Clinical Data: No additional findings.   Subjective: Chief Complaint  Patient presents with   Left Knee - Pain    [name] returns today for follow-up on left thumb pain and left knee pain.  She underwent cortisone injections for both on 02/01/2023.  States that the pain did not improve significantly.  She is interested in exploring other options. Review of Systems  Constitutional: Negative.   HENT: Negative.    Eyes: Negative.   Respiratory: Negative.    Cardiovascular: Negative.   Endocrine: Negative.   Musculoskeletal: Negative.   Neurological: Negative.   Hematological: Negative.   Psychiatric/Behavioral: Negative.    All other systems reviewed and are negative.    Objective: Vital Signs: There were no vitals taken for this visit.  Physical Exam Vitals and nursing note reviewed.  Constitutional:      Appearance: She is well-developed.  HENT:     Head: Normocephalic and atraumatic.  Pulmonary:     Effort: Pulmonary effort is normal.  Abdominal:     Palpations: Abdomen is soft.  Musculoskeletal:     Cervical back: Neck supple.  Skin:    General: Skin is warm.     Capillary Refill: Capillary refill takes less than 2 seconds.  Neurological:     Mental Status: She is alert and oriented to person, place, and time.  Psychiatric:  Behavior: Behavior normal.        Thought Content: Thought content normal.        Judgment: Judgment normal.    Ortho Exam Examination of the left knee shows medial joint line tenderness.  Pain and crepitus with range of motion.  Trace effusion.  Collaterals and cruciates are stable.  Examination of the left hand is unchanged. Specialty Comments:  No specialty comments available.  Imaging: No results found.   PMFS History: Patient Active Problem List   Diagnosis Date Noted   Primary osteoarthritis of left knee 03/01/2023   Alcohol dependence  with uncomplicated withdrawal (HCC) 12/27/2022   Atypical lobular hyperplasia [facility]) of right breast 06/18/2020   Influenza A 10/23/2017   Ganglion cyst of wrist, right 09/16/2016   Esophageal reflux 05/24/2016   Anal fissure 10/23/2015   Epidermoid cyst 09/02/2015   Rectal polyp,focal high grade dysplasia 06/27/2015   Tinea pedis 05/26/2015   Pain of left thumb 05/26/2015   Osteoarthritis of left wrist 04/29/2015   De Quervain's tenosynovitis, left 04/14/2015   Neck pain on right side 01/13/2015   Carpal tunnel syndrome 12/30/2014   Smoker 07/03/2014   Family history of stomach cancer 07/03/2014   Past Medical History:  Diagnosis Date   Allergy    Carpal tunnel syndrome of right wrist    Ganglion cyst of wrist, right    GERD (gastroesophageal reflux disease)    History of adenomatous polyp of colon    tubular adenoma's 09/ 2016;  06/ 2017   History of Clostridium difficile colitis 05/21/2016   History of frequent urinary tract infections    History of gastric ulcer    clinical diagnosis, no prior EGD   History of uterine fibroid    Left breast mass    Migraines    OA (osteoarthritis)    Smoker    1ppd    Family History  Problem Relation Age of Onset   Diabetes Mother    Hypertension Mother    Ovarian cancer Mother    Stomach cancer Maternal Uncle    Heart disease Sister    Healthy Brother    Asthma Son    Asthma Daughter    Colon cancer Maternal Uncle 65   Colon polyps Neg Hx    Esophageal cancer Neg Hx    Rectal cancer Neg Hx     Past Surgical History:  Procedure Laterality Date   BREAST LUMPECTOMY WITH RADIOACTIVE SEED LOCALIZATION Left 05/01/2020   Procedure: LEFT BREAST LUMPECTOMY X 2 WITH RADIOACTIVE SEED LOCALIZATION;  Surgeon: Griselda Miner, MD;  Location: [facility];  Service: General;  Laterality: Left;   CARPAL TUNNEL RELEASE Right 01/13/2017   Procedure: CARPAL TUNNEL RELEASE;  Surgeon: Bradly Bienenstock, MD;  Location[facility];  Service: Orthopedics;  Laterality: Right;   COLONOSCOPY  last one 01/ 03/ 2018   sigmoid scope   GANGLION CYST EXCISION Right 01/13/2017   Procedure: REMOVAL GANGLION OF WRIST;  Surgeon: Bradly Bienenstock, MD;  Location: [facility];  Service: Orthopedics;  Laterality: Right;   KNEE ARTHROSCOPY Left ~ 2015   VAGINAL HYSTERECTOMY  2006   Social History   Occupational History   Occupation: unemployed    Comment: Production manager  Tobacco Use   Smoking status: Every Day    Packs/day: 1.00    Years: 35.00    Additional pack years: 0.00    Total pack years: 35.00    Types: Cigarettes   Smokeless  tobacco: Never  Vaping Use   Vaping Use: Never used  Substance and Sexual Activity   Alcohol use: No    Alcohol/week: 0.0 standard drinks of alcohol   Drug use: No   Sexual activity: Yes    Birth control/protection: Surgical

## 2023-03-01 ENCOUNTER — Ambulatory Visit: Payer: Medicaid Other | Admitting: Orthopaedic Surgery

## 2023-03-01 DIAGNOSIS — M79645 Pain in left finger(s): Secondary | ICD-10-CM

## 2023-03-01 DIAGNOSIS — M1712 Unilateral primary osteoarthritis, left knee: Secondary | ICD-10-CM

## 2023-03-10 ENCOUNTER — Ambulatory Visit (HOSPITAL_COMMUNITY)
Admission: EM | Admit: 2023-03-10 | Discharge: 2023-03-10 | Disposition: A | Discharge status: Home / Self Care | Payer: Medicaid Other | Attending: Nurse Practitioner | Admitting: Nurse Practitioner

## 2023-03-10 ENCOUNTER — Encounter (HOSPITAL_COMMUNITY): Payer: Self-pay | Admitting: *Deleted

## 2023-03-10 DIAGNOSIS — K029 Dental caries, unspecified: Secondary | ICD-10-CM | POA: Diagnosis not present

## 2023-03-10 DIAGNOSIS — K047 Periapical abscess without sinus: Secondary | ICD-10-CM | POA: Diagnosis not present

## 2023-03-10 MED ORDER — AMOXICILLIN 500 MG PO TABS: 500.0000 mg | ORAL_TABLET | Freq: Two times a day (BID) | ORAL | 0 refills | Status: AC

## 2023-03-10 NOTE — ED Triage Notes (Signed)
Pt states she has top right dental pain and swelling X 1 week. She states she has taken a whole bottle of tylenol in a week

## 2023-03-10 NOTE — Discharge Instructions (Signed)
You have a right frontal tooth infection.  You have been prescribed amoxicillin 500 mg twice daily for 10 days.  You have also been prescribed Motrin 800 mg to also help with pain while the antibiotic is working.  You are encouraged to establish care with a dentist for further evaluation for this can be recurrent if left untreated.

## 2023-03-10 NOTE — ED Provider Notes (Signed)
[facility]    CSN: [id number] Arrival date & time: 03/10/23  1359      History   Chief Complaint Chief Complaint  Patient presents with   Dental Pain    [name] is a 52 y.o. female.   HPI  She is in today for swelling to her face.  She reports that she has a broken tooth that is hanging.  She developed swelling.  She denies any fever, chills.  She is 10 out of 10 pain.  The swelling is going up into her face.  She denies headache, dizziness, shortness of breath or chest pains.   Past Medical History:  Diagnosis Date   Allergy    Carpal tunnel syndrome of right wrist    Ganglion cyst of wrist, right    GERD (gastroesophageal reflux disease)    History of adenomatous polyp of colon    tubular adenoma's 09/ 2016;  06/ 2017   History of Clostridium difficile colitis 05/21/2016   History of frequent urinary tract infections    History of gastric ulcer    clinical diagnosis, no prior EGD   History of uterine fibroid    Left breast mass    Migraines    OA (osteoarthritis)    Smoker    1ppd    Patient Active Problem List   Diagnosis Date Noted   Primary osteoarthritis of left knee 03/01/2023   Alcohol dependence with uncomplicated withdrawal (HCC) 12/27/2022   Atypical lobular hyperplasia ([facility]) of right breast 06/18/2020   Influenza A 10/23/2017   Ganglion cyst of wrist, right 09/16/2016   Esophageal reflux 05/24/2016   Anal fissure 10/23/2015   Epidermoid cyst 09/02/2015   Rectal polyp,focal high grade dysplasia 06/27/2015   Tinea pedis 05/26/2015   Pain of left thumb 05/26/2015   Osteoarthritis of left wrist 04/29/2015   De Quervain's tenosynovitis, left 04/14/2015   Neck pain on right side 01/13/2015   Carpal tunnel syndrome 12/30/2014   Smoker 07/03/2014   Family history of stomach cancer 07/03/2014    Past Surgical History:  Procedure Laterality Date   BREAST LUMPECTOMY WITH RADIOACTIVE SEED LOCALIZATION Left 05/01/2020    Procedure: LEFT BREAST LUMPECTOMY X 2 WITH RADIOACTIVE SEED LOCALIZATION;  Surgeon: Griselda Miner, MD;  Location: [facility];  Service: General;  Laterality: Left;   CARPAL TUNNEL RELEASE Right 01/13/2017   Procedure: CARPAL TUNNEL RELEASE;  Surgeon: Bradly Bienenstock, MD;  Location: [facility];  Service: Orthopedics;  Laterality: Right;   COLONOSCOPY  last one 01/ 03/ 2018   sigmoid scope   GANGLION CYST EXCISION Right 01/13/2017   Procedure: REMOVAL GANGLION OF WRIST;  Surgeon: Bradly Bienenstock, MD;  Location: [facility];  Service: Orthopedics;  Laterality: Right;   KNEE ARTHROSCOPY Left ~ 2015   VAGINAL HYSTERECTOMY  2006    OB History   No obstetric history on file.      Home Medications    Prior to Admission medications   Medication Sig Start Date End Date Taking? Authorizing Provider  amoxicillin (AMOXIL) 500 MG tablet Take 1 tablet (500 mg total) by mouth 2 (two) times daily for 10 days. 03/10/23 03/20/23 Yes Barbette Merino, NP  methocarbamol (ROBAXIN-750) 750 MG tablet Take 1 tablet (750 mg total) by mouth 3 (three) times daily as needed for muscle spasms. 12/08/22  Yes Cristie Hem, PA-C  pregabalin (LYRICA) 50 MG capsule Take 1 capsule (50 mg total) by mouth 3 (three) times  daily. 12/27/22  Yes Claiborne Rigg, NP  tamoxifen (NOLVADEX) 20 MG tablet TAKE 1 TABLET(20 MG) BY MOUTH DAILY 08/27/22  Yes Serena Croissant, MD  pantoprazole (PROTONIX) 40 MG tablet Take 1 tablet (40 mg total) by mouth 2 (two) times daily. Take before breakfast and dinner 01/24/20 04/23/20  Sammuel Cooper, PA-C    Family History Family History  Problem Relation Age of Onset   Diabetes Mother    Hypertension Mother    Ovarian cancer Mother    Stomach cancer Maternal Uncle    Heart disease Sister    Healthy Brother    Asthma Son    Asthma Daughter    Colon cancer Maternal Uncle 3   Colon polyps Neg Hx    Esophageal cancer Neg Hx    Rectal cancer Neg Hx      Social History Social History   Tobacco Use   Smoking status: Every Day    Packs/day: 1.00    Years: 35.00    Additional pack years: 0.00    Total pack years: 35.00    Types: Cigarettes   Smokeless tobacco: Never  Vaping Use   Vaping Use: Never used  Substance Use Topics   Alcohol use: No    Alcohol/week: 0.0 standard drinks of alcohol   Drug use: No     Allergies   Sulfa antibiotics and Sulfacetamide sodium   Review of Systems Review of Systems   Physical Exam Triage Vital Signs ED Triage Vitals  Enc Vitals Group     BP 03/10/23 1414 114/74     Pulse Rate 03/10/23 1414 97     Resp 03/10/23 1414 18     Temp 03/10/23 1414 98.7 F (37.1 C)     Temp Source 03/10/23 1414 Oral     SpO2 03/10/23 1414 94 %     Weight --      Height --      Head Circumference --      Peak Flow --      Pain Score 03/10/23 1413 10     Pain Loc --      Pain Edu? --      Excl. in GC? --    No data found.  Updated Vital Signs BP 114/74 (BP Location: Right Arm)   Pulse 97   Temp 98.7 F (37.1 C) (Oral)   Resp 18   SpO2 94%   Visual Acuity Right Eye Distance:   Left Eye Distance:   Bilateral Distance:    Right Eye Near:   Left Eye Near:    Bilateral Near:     Physical Exam HENT:     Head: Normocephalic.     Mouth/Throat:     Mouth: Mucous membranes are moist.     Comments: Right frontal dental caries. Right upper lips facial swelling into right nare Cardiovascular:     Rate and Rhythm: Normal rate.  Pulmonary:     Effort: Pulmonary effort is normal.  Musculoskeletal:        General: Normal range of motion.  Skin:    General: Skin is warm and dry.     Capillary Refill: Capillary refill takes less than 2 seconds.  Neurological:     General: No focal deficit present.     Mental Status: She is alert.  Psychiatric:        Mood and Affect: Mood normal.      UC Treatments / Results  Labs (all labs ordered are listed, but only  abnormal results are  displayed) Labs Reviewed - No data to display  EKG   Radiology No results found.  Procedures Procedures (including critical care time)  Medications Ordered in UC Medications - No data to display  Initial Impression / Assessment and Plan / UC Course  I have reviewed the triage vital signs and the nursing notes.  Pertinent labs & imaging results that were available during my care of the patient were reviewed by me and considered in my medical decision making (see chart for details).     Tooth pain Final Clinical Impressions(s) / UC Diagnoses   Final diagnoses:  Infected dental caries     Discharge Instructions      You have a right frontal tooth infection.  You have been prescribed amoxicillin 500 mg twice daily for 10 days.  You have also been prescribed Motrin 800 mg to also help with pain while the antibiotic is working.  You are encouraged to establish care with a dentist for further evaluation for this can be recurrent if left untreated.    ED Prescriptions     Medication Sig Dispense Auth. Provider   amoxicillin (AMOXIL) 500 MG tablet Take 1 tablet (500 mg total) by mouth 2 (two) times daily for 10 days. 20 tablet Barbette Merino, NP      PDMP not reviewed this encounter.   Thad Ranger [place], [place] 03/10/23 [phone]

## 2023-03-10 NOTE — ED Provider Notes (Incomplete)
[facility]    CSN: [id number] Arrival date & time: 03/10/23  1359      History   Chief Complaint Chief Complaint  Patient presents with  . Dental Pain    [name] is a 52 y.o. female.   HPI  She is in today for swelling to her face.  She reports that she has a broken tooth that is hanging.  She developed swelling.  She denies any fever, chills.  She is 10 out of 10 pain.  The swelling is going up into her face.  She denies headache, dizziness, shortness of breath or chest pains. Past Medical History:  Diagnosis Date  . Allergy   . Carpal tunnel syndrome of right wrist   . Ganglion cyst of wrist, right   . GERD (gastroesophageal reflux disease)   . History of adenomatous polyp of colon    tubular adenoma's 09/ 2016;  06/ 2017  . History of Clostridium difficile colitis 05/21/2016  . History of frequent urinary tract infections   . History of gastric ulcer    clinical diagnosis, no prior EGD  . History of uterine fibroid   . Left breast mass   . Migraines   . OA (osteoarthritis)   . Smoker    1ppd    Patient Active Problem List   Diagnosis Date Noted  . Primary osteoarthritis of left knee 03/01/2023  . Alcohol dependence with uncomplicated withdrawal (HCC) 12/27/2022  . Atypical lobular hyperplasia [facility]) of right breast 06/18/2020  . Influenza A 10/23/2017  . Ganglion cyst of wrist, right 09/16/2016  . Esophageal reflux 05/24/2016  . Anal fissure 10/23/2015  . Epidermoid cyst 09/02/2015  . 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 Tinea pedis 05/26/2015  . Pain of left thumb 05/26/2015  . 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 De Quervain's tenosynovitis, left 04/14/2015  . Neck pain on right side 01/13/2015  . Carpal tunnel syndrome 12/30/2014  . Smoker 07/03/2014  . Family history of stomach cancer 07/03/2014    Past Surgical History:  Procedure Laterality Date  . BREAST LUMPECTOMY WITH RADIOACTIVE SEED  LOCALIZATION Left 05/01/2020   Procedure: LEFT BREAST LUMPECTOMY X 2 WITH RADIOACTIVE SEED LOCALIZATION;  Surgeon: Griselda Miner, MD;  Location: [facility];  Service: General;  Laterality: Left;  . CARPAL TUNNEL RELEASE Right 01/13/2017   Procedure: CARPAL TUNNEL RELEASE;  Surgeon: Bradly Bienenstock, MD;  Location: [facility];  Service: Orthopedics;  Laterality: Right;  . COLONOSCOPY  last one 01/ 03/ 2018   sigmoid scope  . GANGLION CYST EXCISION Right 01/13/2017   Procedure: REMOVAL GANGLION OF WRIST;  Surgeon: Bradly Bienenstock, MD;  Location: [facility];  Service: Orthopedics;  Laterality: Right;  . KNEE ARTHROSCOPY Left ~ 2015  . VAGINAL HYSTERECTOMY  2006    OB History   No obstetric history on file.      Home Medications    Prior to Admission medications   Medication Sig Start Date End Date Taking? Authorizing Provider  methocarbamol (ROBAXIN-750) 750 MG tablet Take 1 tablet (750 mg total) by mouth 3 (three) times daily as needed for muscle spasms. 12/08/22  Yes Cristie Hem, PA-C  pregabalin (LYRICA) 50 MG capsule Take 1 capsule (50 mg total) by mouth 3 (three) times daily. 12/27/22  Yes Claiborne Rigg, NP  tamoxifen (NOLVADEX) 20 MG tablet TAKE 1 TABLET(20 MG) BY MOUTH DAILY 08/27/22  Yes Serena Croissant, MD  pantoprazole (PROTONIX)  40 MG tablet Take 1 tablet (40 mg total) by mouth 2 (two) times daily. Take before breakfast and dinner 01/24/20 04/23/20  Sammuel Cooper, PA-C    Family History Family History  Problem Relation Age of Onset  . Diabetes Mother   . Hypertension Mother   . Ovarian cancer Mother   . Stomach cancer Maternal Uncle   . Heart disease Sister   . Healthy Brother   . Asthma Son   . Asthma Daughter   . Colon cancer Maternal Uncle 69  . Colon polyps Neg Hx   . Esophageal cancer Neg Hx   . Rectal cancer Neg Hx     Social History Social History   Tobacco Use  . Smoking status: Every Day    Packs/day: 1.00     Years: 35.00    Additional pack years: 0.00    Total pack years: 35.00    Types: Cigarettes  . Smokeless tobacco: Never  Vaping Use  . Vaping Use: Never used  Substance Use Topics  . Alcohol use: No    Alcohol/week: 0.0 standard drinks of alcohol  . Drug use: No     Allergies   Sulfa antibiotics and Sulfacetamide sodium   Review of Systems Review of Systems   Physical Exam Triage Vital Signs ED Triage Vitals  Enc Vitals Group     BP 03/10/23 1414 114/74     Pulse Rate 03/10/23 1414 97     Resp 03/10/23 1414 18     Temp 03/10/23 1414 98.7 F (37.1 C)     Temp Source 03/10/23 1414 Oral     SpO2 03/10/23 1414 94 %     Weight --      Height --      Head Circumference --      Peak Flow --      Pain Score 03/10/23 1413 10     Pain Loc --      Pain Edu? --      Excl. in GC? --    No data found.  Updated Vital Signs BP 114/74 (BP Location: Right Arm)   Pulse 97   Temp 98.7 F (37.1 C) (Oral)   Resp 18   SpO2 94%   Visual Acuity Right Eye Distance:   Left Eye Distance:   Bilateral Distance:    Right Eye Near:   Left Eye Near:    Bilateral Near:     Physical Exam   UC Treatments / Results  Labs (all labs ordered are listed, but only abnormal results are displayed) Labs Reviewed - No data to display  EKG   Radiology No results found.  Procedures Procedures (including critical care time)  Medications Ordered in UC Medications - No data to display  Initial Impression / Assessment and Plan / UC Course  I have reviewed the triage vital signs and the nursing notes.  Pertinent labs & imaging results that were available during my care of the patient were reviewed by me and considered in my medical decision making (see chart for details).     *** Final Clinical Impressions(s) / UC Diagnoses   Final diagnoses:  None   Discharge Instructions   None    ED Prescriptions   None    PDMP not reviewed this encounter.

## 2023-03-17 ENCOUNTER — Telehealth: Payer: Self-pay

## 2023-03-17 NOTE — Telephone Encounter (Signed)
3 attempts made to reach pt for PV. VM is not set up unable to leave a VM. If patient returns call by end of day we can get her PV r/s otherwise her colonoscopy will be cancelled. In the event we don't hear from the patient she will be notified via no shower letter to the address on file.

## 2023-03-25 ENCOUNTER — Encounter: Payer: Self-pay | Admitting: Internal Medicine

## 2023-03-29 ENCOUNTER — Ambulatory Visit: Payer: Medicaid Other | Admitting: Nurse Practitioner

## 2023-04-05 ENCOUNTER — Encounter: Payer: Medicaid Other | Admitting: Internal Medicine

## 2023-04-05 ENCOUNTER — Telehealth: Payer: Self-pay

## 2023-04-05 DIAGNOSIS — M19032 Primary osteoarthritis, left wrist: Secondary | ICD-10-CM | POA: Diagnosis not present

## 2023-04-05 DIAGNOSIS — M79642 Pain in left hand: Secondary | ICD-10-CM | POA: Diagnosis not present

## 2023-04-05 DIAGNOSIS — M87243 Osteonecrosis due to previous trauma, unspecified hand: Secondary | ICD-10-CM | POA: Diagnosis not present

## 2023-04-05 NOTE — Telephone Encounter (Signed)
Chart review completed for patient. Patient is due for screening mammogram. Mychart message sent to patient to inquire about scheduling mammogram.  Kadiatou Oplinger, Population Health Specialist.  

## 2023-04-15 ENCOUNTER — Telehealth: Payer: Self-pay

## 2023-04-15 NOTE — Telephone Encounter (Signed)
No Show for Pre-Visit. Three attempts were made to reach patient. Not able to leave a message because the line was busy. If patient doesn't call before 5:00 today to reschedule a no show letter will be mailed and her colonoscopy will be cancelled per [facility] guidelines.

## 2023-04-15 NOTE — Telephone Encounter (Signed)
No show for PV. Three attempts were made to reach patient. Unable to leave a message because the line was busy every time. If patient doesn't call and reschedule PV before 5:00 today a no show letter will be mailed and procedure will be cancelled per [facility] guidelines.

## 2023-05-12 ENCOUNTER — Encounter: Payer: Medicaid Other | Admitting: Internal Medicine

## 2023-07-20 ENCOUNTER — Ambulatory Visit: Payer: Self-pay

## 2023-07-20 NOTE — Telephone Encounter (Signed)
Call placed to patient unable to reach and same message that was received as documented in previous encounter call unable to be completed.

## 2023-07-20 NOTE — Telephone Encounter (Signed)
2nd attempt, called pt, unable to LVM d/t call cannot be completed at this time. Previously tried husband's # and got same message. Will route to provider for review since no way to get in contact with pt.  Summary: head/chest congestion   Patient called stated she has been fighting a cold for 3 weeks. She is having head and chest congestion along with body aches and fever. No appts for any provider before Nov. Patient is requesting provider prescribe something for her symptoms

## 2023-07-20 NOTE — Telephone Encounter (Signed)
Pt and husband phone #s called and unable to get thru, "call cannot be completed at this time"  Summary: head/chest congestion   Patient called stated she has been fighting a cold for 3 weeks. She is having head and chest congestion along with body aches and fever. No appts for any provider before Nov. Patient is requesting provider prescribe something for her symptoms

## 2023-07-20 NOTE — Telephone Encounter (Signed)
   Summary: head/chest congestion   Patient called stated she has been fighting a cold for 3 weeks. She is having head and chest congestion along with body aches and fever. No appts for any provider before Nov. Patient is requesting provider prescribe something for her symptoms

## 2023-09-13 ENCOUNTER — Encounter: Payer: Self-pay | Admitting: Physician Assistant

## 2023-11-28 ENCOUNTER — Ambulatory Visit: Payer: Medicaid Other | Admitting: Physician Assistant

## 2023-12-07 ENCOUNTER — Ambulatory Visit: Payer: Medicaid Other | Attending: Nurse Practitioner | Admitting: Nurse Practitioner

## 2023-12-07 ENCOUNTER — Other Ambulatory Visit: Payer: Self-pay

## 2023-12-07 ENCOUNTER — Encounter: Payer: Self-pay | Admitting: Nurse Practitioner

## 2023-12-07 VITALS — BP 127/85 | HR 94 | Wt 138.6 lb

## 2023-12-07 DIAGNOSIS — Z139 Encounter for screening, unspecified: Secondary | ICD-10-CM | POA: Diagnosis not present

## 2023-12-07 DIAGNOSIS — M542 Cervicalgia: Secondary | ICD-10-CM | POA: Diagnosis not present

## 2023-12-07 DIAGNOSIS — F32A Depression, unspecified: Secondary | ICD-10-CM | POA: Diagnosis not present

## 2023-12-07 DIAGNOSIS — F419 Anxiety disorder, unspecified: Secondary | ICD-10-CM | POA: Diagnosis not present

## 2023-12-07 MED ORDER — METHOCARBAMOL 750 MG PO TABS
750.0000 mg | ORAL_TABLET | Freq: Three times a day (TID) | ORAL | 3 refills | Status: AC | PRN
  Filled 2023-12-07: qty 30, 10d supply, fill #0

## 2023-12-07 MED ORDER — ESCITALOPRAM OXALATE 5 MG PO TABS
5.0000 mg | ORAL_TABLET | Freq: Every day | ORAL | 1 refills | Status: DC
  Filled 2023-12-07: qty 30, 30d supply, fill #0
  Filled 2024-01-18: qty 30, 30d supply, fill #1

## 2023-12-07 NOTE — Progress Notes (Signed)
 Assessment & Plan:  Alyric was seen today for depression.  Diagnoses and all orders for this visit:  Anxiety and depression -     escitalopram (LEXAPRO) 5 MG tablet; Take 1 tablet (5 mg total) by mouth daily. -     Ambulatory referral to Behavioral Health Walk in schedule for [facility] given to patient today.   Encounter for screening involving social determinants of health (SDoH) -     AMB Referral [facility] Care Management  Neck pain on right side -     methocarbamol (ROBAXIN-750) 750 MG tablet; Take 1 tablet (750 mg total) by mouth 3 (three) times daily as needed for muscle spasms.    Patient has been counseled on age-appropriate routine health concerns for screening and prevention. These are reviewed and up-to-date. Referrals have been placed accordingly. Immunizations are up-to-date or declined.    Subjective:   Chief Complaint  Patient presents with   Depression    April Hudson 53 y.o. female presents to office today for depression. She endorses depression since last summer and feels her mood may be worsening. Lots of financial stressors and marital discord. She denies any current thoughts of self harm.     12/07/2023    3:09 PM 12/27/2022    9:32 AM 11/29/2022   10:18 AM  Depression screen PHQ 2/9  Decreased Interest 3 1 2   Down, Depressed, Hopeless 3 1 0  PHQ - 2 Score 6 2 2   Altered sleeping 3 1 1   Tired, decreased energy 2 1 1   Change in appetite 1 0 0  Feeling bad or failure about yourself  3 2 0  Trouble concentrating 2 0 0  Moving slowly or fidgety/restless 0 0 0  Suicidal thoughts 2 2 0  PHQ-9 Score 19 8 4        12/07/2023    3:09 PM 12/27/2022    9:34 AM 11/29/2022   10:18 AM 02/20/2020    2:32 PM  GAD 7 : Generalized Anxiety Score  Nervous, Anxious, on Edge 0 0 0 0  Control/stop worrying 2 2 1  0  Worry too much - different things 3 3 1 2   Trouble relaxing 2 3 0 0  Restless 1 0 0 0  Easily annoyed or irritable 3 3 1  0  Afraid - awful might happen 1 1 0 0   Total GAD 7 Score 12 12 3 2       Review of Systems  Constitutional:  Negative for fever, malaise/fatigue and weight loss.  HENT: Negative.  Negative for nosebleeds.   Eyes: Negative.  Negative for blurred vision, double vision and photophobia.  Respiratory: Negative.  Negative for cough and shortness of breath.   Cardiovascular: Negative.  Negative for chest pain, palpitations and leg swelling.  Gastrointestinal: Negative.  Negative for heartburn, nausea and vomiting.  Musculoskeletal: Negative.  Negative for myalgias.  Neurological: Negative.  Negative for dizziness, focal weakness, seizures and headaches.  Psychiatric/Behavioral:  Positive for depression. Negative for suicidal ideas. The patient is nervous/anxious.     Past Medical History:  Diagnosis Date   Allergy    Carpal tunnel syndrome of right wrist    Ganglion cyst of wrist, right    GERD (gastroesophageal reflux disease)    History of adenomatous polyp of colon    tubular adenoma's 09/ 2016;  06/ 2017   History of Clostridium difficile colitis 05/21/2016   History of frequent urinary tract infections    History of gastric ulcer    clinical  diagnosis, no prior EGD   History of uterine fibroid    Left breast mass    Migraines    OA (osteoarthritis)    Smoker    1ppd    Past Surgical History:  Procedure Laterality Date   BREAST LUMPECTOMY WITH RADIOACTIVE SEED LOCALIZATION Left 05/01/2020   Procedure: LEFT BREAST LUMPECTOMY X 2 WITH RADIOACTIVE SEED LOCALIZATION;  Surgeon: Griselda Miner, MD;  Location: [facility];  Service: General;  Laterality: Left;   CARPAL TUNNEL RELEASE Right 01/13/2017   Procedure: CARPAL TUNNEL RELEASE;  Surgeon: Bradly Bienenstock, MD;  Location: [facility];  Service: Orthopedics;  Laterality: Right;   COLONOSCOPY  last one 01/ 03/ 2018   sigmoid scope   GANGLION CYST EXCISION Right 01/13/2017   Procedure: REMOVAL GANGLION OF WRIST;  Surgeon: Bradly Bienenstock, MD;   Location: [facility];  Service: Orthopedics;  Laterality: Right;   KNEE ARTHROSCOPY Left ~ 2015   VAGINAL HYSTERECTOMY  2006    Family History  Problem Relation Age of Onset   Diabetes Mother    Hypertension Mother    Ovarian cancer Mother    Stomach cancer Maternal Uncle    Heart disease Sister    Healthy Brother    Asthma Son    Asthma Daughter    Colon cancer Maternal Uncle 80   Colon polyps Neg Hx    Esophageal cancer Neg Hx    Rectal cancer Neg Hx     Social History Reviewed with no changes to be made today.   Outpatient Medications Prior to Visit  Medication Sig Dispense Refill   pregabalin (LYRICA) 50 MG capsule Take 1 capsule (50 mg total) by mouth 3 (three) times daily. 90 capsule 1   tamoxifen (NOLVADEX) 20 MG tablet TAKE 1 TABLET(20 MG) BY MOUTH DAILY 90 tablet 0   methocarbamol (ROBAXIN-750) 750 MG tablet Take 1 tablet (750 mg total) by mouth 3 (three) times daily as needed for muscle spasms. 20 tablet 2   No facility-administered medications prior to visit.    Allergies  Allergen Reactions   Sulfa Antibiotics Hives and Rash   Sulfacetamide Sodium Hives       Objective:    BP 127/85 (BP Location: Left Arm, Patient Position: Sitting, Cuff Size: Normal)   Pulse 94   Wt 138 lb 9.6 oz (62.9 kg)   SpO2 98%   BMI 25.76 kg/m  Wt Readings from Last 3 Encounters:  12/07/23 138 lb 9.6 oz (62.9 kg)  02/01/23 135 lb (61.2 kg)  12/27/22 134 lb 12.8 oz (61.1 kg)    Physical Exam Vitals and nursing note reviewed.  Constitutional:      Appearance: She is well-developed.  HENT:     Head: Normocephalic and atraumatic.  Cardiovascular:     Rate and Rhythm: Normal rate and regular rhythm.     Heart sounds: Normal heart sounds. No murmur heard.    No friction rub. No gallop.  Pulmonary:     Effort: Pulmonary effort is normal. No tachypnea or respiratory distress.     Breath sounds: Normal breath sounds. No decreased breath sounds, wheezing,  rhonchi or rales.  Chest:     Chest wall: No tenderness.  Abdominal:     General: Bowel sounds are normal.     Palpations: Abdomen is soft.  Musculoskeletal:        General: Normal range of motion.     Cervical back: Normal range of motion.  Skin:  General: Skin is warm and dry.  Neurological:     Mental Status: She is alert and oriented to person, place, and time.     Coordination: Coordination normal.  Psychiatric:        Behavior: Behavior normal. Behavior is cooperative.        Thought Content: Thought content normal.        Judgment: Judgment normal.          Patient has been counseled extensively about nutrition and exercise as well as the importance of adherence with medications and regular follow-up. The patient was given clear instructions to go to ER or return to medical center if symptoms don't improve, worsen or new problems develop. The patient verbalized understanding.   Follow-up: No follow-ups on file.   Claiborne Rigg, FNP-BC [facility] [place], [place] [phone]   12/07/2023, 3:26 PM

## 2023-12-12 ENCOUNTER — Telehealth: Payer: Self-pay

## 2023-12-12 NOTE — Progress Notes (Signed)
..   Medicaid Managed Care   Unsuccessful Outreach Note  12/12/2023 Name: April Hudson MRN: [id number] DOB: 1971/09/13  Referred by: Claiborne Rigg, NP Reason for referral : High Risk Managed Medicaid (Second attempt made to reach the patient. She did not answer but I was able to leave a message on her VM.)   A second unsuccessful telephone outreach was attempted today. The patient was referred to the case management team for assistance with care management and care coordination.   Follow Up Plan: A HIPAA compliant phone message was left for the patient providing contact information and requesting a return call.  The care management team will reach out to the patient again over the next 7 days.   Weston Settle [facility], [facility] Guide Direct Dial: [phone]  Fax: [phone]

## 2023-12-15 ENCOUNTER — Telehealth: Payer: Self-pay

## 2023-12-15 NOTE — Telephone Encounter (Signed)
 Copied from CRM [id number]. Topic: General - Other >> Dec 15, 2023  9:31 AM Fuller Mandril wrote: Reason for CRM: Patient missed a call from Nurse. Looks like its for referral - care coordination. Attempted to call direct number - VM. Patient would like Nurse to call back at [phone]. Thank You

## 2024-01-10 ENCOUNTER — Ambulatory Visit (HOSPITAL_COMMUNITY): Payer: Self-pay | Admitting: Student

## 2024-01-18 ENCOUNTER — Other Ambulatory Visit: Payer: Self-pay

## 2024-01-18 ENCOUNTER — Encounter: Payer: Self-pay | Admitting: Nurse Practitioner

## 2024-01-18 ENCOUNTER — Ambulatory Visit: Attending: Nurse Practitioner | Admitting: Nurse Practitioner

## 2024-01-18 VITALS — BP 103/67 | HR 80 | Ht 61.5 in | Wt 135.6 lb

## 2024-01-18 DIAGNOSIS — M1712 Unilateral primary osteoarthritis, left knee: Secondary | ICD-10-CM

## 2024-01-18 DIAGNOSIS — F32A Depression, unspecified: Secondary | ICD-10-CM

## 2024-01-18 DIAGNOSIS — F419 Anxiety disorder, unspecified: Secondary | ICD-10-CM

## 2024-01-18 DIAGNOSIS — E876 Hypokalemia: Secondary | ICD-10-CM

## 2024-01-18 DIAGNOSIS — R1013 Epigastric pain: Secondary | ICD-10-CM

## 2024-01-18 DIAGNOSIS — F17200 Nicotine dependence, unspecified, uncomplicated: Secondary | ICD-10-CM | POA: Diagnosis not present

## 2024-01-18 DIAGNOSIS — M19032 Primary osteoarthritis, left wrist: Secondary | ICD-10-CM | POA: Diagnosis not present

## 2024-01-18 DIAGNOSIS — G5603 Carpal tunnel syndrome, bilateral upper limbs: Secondary | ICD-10-CM | POA: Diagnosis not present

## 2024-01-18 MED ORDER — PANTOPRAZOLE SODIUM 40 MG PO TBEC: 40.0000 mg | DELAYED_RELEASE_TABLET | Freq: Two times a day (BID) | ORAL | 1 refills | Status: DC

## 2024-01-18 MED ORDER — PANTOPRAZOLE SODIUM 40 MG PO TBEC
40.0000 mg | DELAYED_RELEASE_TABLET | Freq: Two times a day (BID) | ORAL | 1 refills | Status: AC
  Filled 2024-01-18: qty 60, 30d supply, fill #0
  Filled 2024-01-18: qty 180, 90d supply, fill #0
  Filled 2024-02-10: qty 60, 30d supply, fill #1

## 2024-01-18 NOTE — Patient Instructions (Addendum)
  April Gore, MD Address: [address] [place], [place] [address] Phone: [phone]   [facility] [address]  [phone]

## 2024-01-18 NOTE — Progress Notes (Signed)
 I have seen and examined this patient with the advanced practice provider STUDENT and agree with the note below

## 2024-01-18 NOTE — Progress Notes (Signed)
 Assessment & Plan:  April Hudson was seen today for medical management of chronic issues.  Diagnoses and all orders for this visit:  Anxiety and depression Continue Escitalopram 5 mg every day  Follow up with Behavioral Health in May  Bilateral carpal tunnel syndrome Primary osteoarthritis of left wrist Primary osteoarthritis of left knee Follow up with Orthopedic Surgeon, call to schedule an appointment  Hypokalemia -     [id number]+EGFR   Epigastric pain -     Discontinue: pantoprazole (PROTONIX) 40 MG tablet; Take 1 tablet (40 mg total) by mouth 2 (two) times daily. Take before breakfast and dinner -     pantoprazole (PROTONIX) 40 MG tablet; Take 1 tablet (40 mg total) by mouth 2 (two) times daily. Take before breakfast and dinner  Tobacco dependence -     CT CHEST LUNG CA SCREEN LOW DOSE W/O CM; Future Call to arrange appointment for CT Chest Discussed smoking cessation program. Does not want to quit at this time    Patient has been counseled on age-appropriate routine health concerns for screening and prevention. These are reviewed and up-to-date. Referrals have been placed accordingly. Immunizations are up-to-date or declined.    Subjective:   Chief Complaint  Patient presents with   Medical Management of Chronic Issues    April Hudson 53 y.o. female presents to office today for follow up to  depression and anxiety. She is currently taking lexapro 5 mg daily as prescribed. Patient reports taking medication and has appointment in May for behavorial health. States that was the earliest appointment. Reports she is using Medicaid transportation for appts. Unable to get to appts in the past. She did not report any changes to stressors in the home. Reports she wants to move into her own apartment. She currently is not working, and states finances will be a problem paying for housing. Has information for [employer]. Plans to complete an application to get on  waiting list. Denies suicidal ideations.      01/18/2024    2:18 PM 12/07/2023    3:09 PM 12/27/2022    9:34 AM 11/29/2022   10:18 AM  GAD 7 : Generalized Anxiety Score  Nervous, Anxious, on Edge 1 0 0 0  Control/stop worrying 3 2 2 1   Worry too much - different things 3 3 3 1   Trouble relaxing 2 2 3  0  Restless 3 1 0 0  Easily annoyed or irritable 3 3 3 1   Afraid - awful might happen 3 1 1  0  Total GAD 7 Score 18 12 12 3   Anxiety Difficulty Extremely difficult           01/18/2024    2:17 PM 12/07/2023    3:09 PM 12/27/2022    9:32 AM 11/29/2022   10:18 AM 02/20/2020    2:32 PM  Depression screen PHQ 2/9  Decreased Interest 2 3 1 2  0  Down, Depressed, Hopeless 2 3 1  0 0  PHQ - 2 Score 4 6 2 2  0  Altered sleeping 3 3 1 1 2   Tired, decreased energy 1 2 1 1  0  Change in appetite 1 1 0 0 0  Feeling bad or failure about yourself  1 3 2  0 0  Trouble concentrating 1 2 0 0 0  Moving slowly or fidgety/restless 1 0 0 0 0  Suicidal thoughts 0 2 2 0 0  PHQ-9 Score 12 19 8 4 2   Difficult doing work/chores Extremely dIfficult  Continues to experience pain in hands and left knee. She is taking methocarbamol for pain as needed. Patient was followed by Orthopedic Surgeon, Dr Christiane Cowing. She will call to schedule follow up appointment.      Review of Systems  Constitutional:  Positive for malaise/fatigue.  HENT: Negative.    Eyes: Negative.   Respiratory: Negative.    Cardiovascular: Negative.   Gastrointestinal: Negative.   Genitourinary: Negative.   Musculoskeletal: Negative.   Skin: Negative.   Neurological: Negative.   Endo/Heme/Allergies: Negative.   Psychiatric/Behavioral:  Positive for depression. The patient has insomnia.     Past Medical History:  Diagnosis Date   Allergy    Carpal tunnel syndrome of right wrist    Ganglion cyst of wrist, right    GERD (gastroesophageal reflux disease)    History of adenomatous polyp of colon    tubular adenoma's 09/ 2016;  06/ 2017    History of Clostridium difficile colitis 05/21/2016   History of frequent urinary tract infections    History of gastric ulcer    clinical diagnosis, no prior EGD   History of uterine fibroid    Left breast mass    Migraines    OA (osteoarthritis)    Smoker    1ppd    Past Surgical History:  Procedure Laterality Date   BREAST LUMPECTOMY WITH RADIOACTIVE SEED LOCALIZATION Left 05/01/2020   Procedure: LEFT BREAST LUMPECTOMY X 2 WITH RADIOACTIVE SEED LOCALIZATION;  Surgeon: Caralyn Chandler, MD;  Location: [facility];  Service: General;  Laterality: Left;   CARPAL TUNNEL RELEASE Right 01/13/2017   Procedure: CARPAL TUNNEL RELEASE;  Surgeon: Arvil Birks, MD;  Location[facility];  Service: Orthopedics;  Laterality: Right;   COLONOSCOPY  last one 01/ 03/ 2018   sigmoid scope   GANGLION CYST EXCISION Right 01/13/2017   Procedure: REMOVAL GANGLION OF WRIST;  Surgeon: Arvil Birks, MD;  Location: [facility];  Service: Orthopedics;  Laterality: Right;   KNEE ARTHROSCOPY Left ~ 2015   VAGINAL HYSTERECTOMY  2006    Family History  Problem Relation Age of Onset   Diabetes Mother    Hypertension Mother    Ovarian cancer Mother    Stomach cancer Maternal Uncle    Heart disease Sister    Healthy Brother    Asthma Son    Asthma Daughter    Colon cancer Maternal Uncle 3   Colon polyps Neg Hx    Esophageal cancer Neg Hx    Rectal cancer Neg Hx     Social History Reviewed with no changes to be made today.   Outpatient Medications Prior to Visit  Medication Sig Dispense Refill   escitalopram (LEXAPRO) 5 MG tablet Take 1 tablet (5 mg total) by mouth daily. 30 tablet 1   methocarbamol (ROBAXIN-750) 750 MG tablet Take 1 tablet (750 mg total) by mouth 3 (three) times daily as needed for muscle spasms. 30 tablet 3   tamoxifen (NOLVADEX) 20 MG tablet TAKE 1 TABLET(20 MG) BY MOUTH DAILY 90 tablet 0   pregabalin (LYRICA) 50 MG capsule Take 1  capsule (50 mg total) by mouth 3 (three) times daily. (Patient not taking: Reported on 01/18/2024) 90 capsule 1   No facility-administered medications prior to visit.    Allergies  Allergen Reactions   Sulfa Antibiotics Hives and Rash   Sulfacetamide Sodium Hives       Objective:    BP 103/67 (BP Location: Left Arm, Patient Position: Sitting, Cuff  Size: Normal)   Pulse 80   Ht 5' 1.5" (1.562 m)   Wt 135 lb 9.6 oz (61.5 kg)   BMI 25.21 kg/m  Wt Readings from Last 3 Encounters:  01/18/24 135 lb 9.6 oz (61.5 kg)  12/07/23 138 lb 9.6 oz (62.9 kg)  02/01/23 135 lb (61.2 kg)    Physical Exam Vitals and nursing note reviewed.  Constitutional:      Appearance: Normal appearance.  HENT:     Head: Normocephalic.  Cardiovascular:     Rate and Rhythm: Normal rate and regular rhythm.     Heart sounds: Normal heart sounds.  Pulmonary:     Effort: Pulmonary effort is normal.     Breath sounds: Normal breath sounds.  Musculoskeletal:        General: Tenderness present.     Right hand: Tenderness present.     Left hand: Tenderness present.     Cervical back: Normal range of motion.     Left knee: Tenderness present.     Right lower leg: Normal.     Left lower leg: Normal.  Skin:    General: Skin is warm and dry.  Neurological:     Mental Status: She is alert and oriented to person, place, and time.  Psychiatric:        Attention and Perception: Attention normal.        Mood and Affect: Affect is flat.        Speech: Speech normal.        Behavior: Behavior is cooperative.        Thought Content: Thought content does not include suicidal ideation.        Cognition and Memory: Cognition and memory normal.        Judgment: Judgment normal.          Patient has been counseled extensively about nutrition and exercise as well as the importance of adherence with medications and regular follow-up. The patient was given clear instructions to go to ER or return to medical center  if symptoms don't improve, worsen or new problems develop. The patient verbalized understanding.   Follow-up: Return if symptoms worsen or fail to improve.   Gita Dilger E Tori Dattilio, BSN RN student FNP [facility] [place], [place] [phone]   01/18/2024, 2:56 PM

## 2024-01-19 LAB — CMP14+EGFR
ALT: 10 IU/L (ref 0–32)
AST: 17 IU/L (ref 0–40)
Albumin: 4.2 g/dL (ref 3.8–4.9)
Alkaline Phosphatase: 84 IU/L (ref 44–121)
BUN/Creatinine Ratio: 12 (ref 9–23)
BUN: 9 mg/dL (ref 6–24)
Bilirubin Total: <0.2 mg/dL (ref 0.0–1.2)
CO2: 23 mmol/L (ref 20–29)
Calcium: 9.3 mg/dL (ref 8.7–10.2)
Chloride: 105 mmol/L (ref 96–106)
Creatinine, Ser: 0.73 mg/dL (ref 0.57–1.00)
Globulin, Total: 2.5 g/dL (ref 1.5–4.5)
Glucose: 82 mg/dL (ref 70–99)
Potassium: 4.5 mmol/L (ref 3.5–5.2)
Sodium: 146 mmol/L — ABNORMAL HIGH (ref 134–144)
Total Protein: 6.7 g/dL (ref 6.0–8.5)
eGFR: 98 mL/min/{1.73_m2} (ref >59)

## 2024-01-24 ENCOUNTER — Encounter: Payer: Self-pay | Admitting: Nurse Practitioner

## 2024-01-25 ENCOUNTER — Ambulatory Visit (INDEPENDENT_AMBULATORY_CARE_PROVIDER_SITE_OTHER): Payer: Self-pay | Admitting: Licensed Clinical Social Worker

## 2024-01-25 ENCOUNTER — Encounter (HOSPITAL_COMMUNITY): Payer: Self-pay | Admitting: Licensed Clinical Social Worker

## 2024-01-25 DIAGNOSIS — F331 Major depressive disorder, recurrent, moderate: Secondary | ICD-10-CM | POA: Diagnosis not present

## 2024-01-25 DIAGNOSIS — F411 Generalized anxiety disorder: Secondary | ICD-10-CM | POA: Insufficient documentation
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
## 2024-02-02 ENCOUNTER — Inpatient Hospital Stay: Payer: Medicaid Other | Attending: Hematology and Oncology | Admitting: Hematology and Oncology

## 2024-02-02 NOTE — Assessment & Plan Note (Deleted)
 Screening mammogram on 01/11/20 showed right breast calcifications, 0.6cm, and a left breast mass at the 11:30 position, 0.7cm.  Biopsy on 02/06/20 showed atypical lobular hyperplasia and calcifications in the right breast, and in the left breast, fibrocystic changes and no evidence of malignancy.  Right lumpectomy on 05/01/20 with Dr. Alethea Andes: Atypical lobular hyperplasia.   Tyrer Cusick 10-year risk 11% and lifetime risk 35%   Risk Reduction: Tamoxifen   Tamoxifen  Toxicities: Moderate to severe hot flashes: I recommended reducing the dosage of tamoxifen  to 10 mg a day.   Right breast pain and discomfort: Gabapentin  did not help and it was discontinued.   She has persistent arthritis especially in the knee and the wrists.  She received injections today.   Breast Cancer Surveillance:  Patient has not done her annual mammograms or breast MRIs   Follow-up in 1 year.

## 2024-02-10 ENCOUNTER — Other Ambulatory Visit (HOSPITAL_COMMUNITY): Payer: Self-pay

## 2024-02-10 ENCOUNTER — Other Ambulatory Visit: Payer: Self-pay | Admitting: Nurse Practitioner

## 2024-02-10 ENCOUNTER — Other Ambulatory Visit: Payer: Self-pay

## 2024-02-10 DIAGNOSIS — F32A Depression, unspecified: Secondary | ICD-10-CM

## 2024-02-10 MED ORDER — ESCITALOPRAM OXALATE 5 MG PO TABS
5.0000 mg | ORAL_TABLET | Freq: Every day | ORAL | 0 refills | Status: AC
  Filled 2024-02-10: qty 90, 90d supply, fill #0

## 2024-02-21 ENCOUNTER — Ambulatory Visit (HOSPITAL_COMMUNITY): Admitting: Student

## 2024-03-06 ENCOUNTER — Ambulatory Visit: Admitting: Orthopaedic Surgery

## 2024-03-14 ENCOUNTER — Telehealth: Payer: Self-pay | Admitting: Orthopaedic Surgery

## 2024-03-14 NOTE — Telephone Encounter (Signed)
 Followed up on surgery order from Dr. Christiane Cowing for left total knee replacement from 1 year ago. Unable to reach patient at [phone]  Patient was scheduled to see Dr. Christiane Cowing 03-06-24 for the knee and the left hand but was a no-show.  Called telephone number listed on file [phone] but call cannot be completed at this time.  Secondary number on file [phone] belongs to the neighbor. He says he does not know how to reach her.

## 2024-05-29 ENCOUNTER — Ambulatory Visit: Payer: Self-pay

## 2024-05-29 NOTE — Telephone Encounter (Signed)
 FYI Only or Action Required?: Action required by provider: request for appointment.  Patient was last seen in primary care on 01/18/2024 by [name], NP.  Called Nurse Triage reporting Cough.  Symptoms began about a month ago.  Interventions attempted: Nothing.  Symptoms are: unchanged. Productive cough with yellow mucus, saw blood over the weekend. Has wheezing. Needs several days notice due to transportation.  Triage Disposition: See PCP When Office is Open (Within 3 Days)  Patient/caregiver understands and will follow disposition?: Yes  Copied from CRM #[id number]. Topic: Clinical - Red Word Triage >> May 29, 2024 12:54 PM [name] wrote: Red Word that prompted transfer to Nurse Triage: patient states she is short of breath, cough up yellowish color with little black. Patient coughed up blood Saturday sometime whistling when breathing Answer Assessment - Initial Assessment Questions 1. ONSET: When did the cough begin?      1 month 2. SEVERITY: How bad is the cough today?      severe 3. SPUTUM: Describe the color of your sputum (e.g., none, dry cough; clear, white, yellow, green)     yellow 4. HEMOPTYSIS: Are you coughing up any blood? If Yes, ask: How much? (e.g., flecks, streaks, tablespoons, etc.)     yes 5. DIFFICULTY BREATHING: Are you having difficulty breathing? If Yes, ask: How bad is it? (e.g., mild, moderate, severe)      moderate 6. FEVER: Do you have a fever? If Yes, ask: What is your temperature, how was it measured, and when did it start?     no 7. CARDIAC HISTORY: Do you have any history of heart disease? (e.g., heart attack, congestive heart failure)      no 8. LUNG HISTORY: Do you have any history of lung disease?  (e.g., pulmonary embolus, asthma, emphysema)     yes 9. PE RISK FACTORS: Do you have a history of blood clots? (or: recent major surgery, recent prolonged travel, bedridden)     no 10. OTHER SYMPTOMS: Do you have any  other symptoms? (e.g., runny nose, wheezing, chest pain)       wheezing 11. PREGNANCY: Is there any chance you are pregnant? When was your last menstrual period?       no 12. TRAVEL: Have you traveled out of the country in the last month? (e.g., travel history, exposures)       no  Protocols used: Cough - Acute Productive-A-AH

## 2024-06-05 ENCOUNTER — Ambulatory Visit: Payer: Self-pay

## 2024-06-05 NOTE — Telephone Encounter (Signed)
 FYI Only or Action Required?: FYI only for provider.  Patient was last seen in primary care on 01/18/2024 by [name], NP.  Called Nurse Triage reporting Cough.  Symptoms began several months ago.  Interventions attempted: Nothing.  Symptoms are: gradually worsening.  Triage Disposition: Go to ED Now (Notify PCP)  Patient/caregiver understands and will follow disposition?: Yes   Copied from CRM [id number]. Topic: Clinical - Red Word Triage >> Jun 05, 2024  2:22 PM Selinda RAMAN wrote: Red Word that prompted transfer to Nurse Triage: The patient called in to originally just cancel her appointment for today stating she could not arrange transportation in [place] but then she said she has been spitting up blood and has had a bad cough. I will transfer her to E2C2 NT. Reason for Disposition  [1] MODERATE difficulty breathing (e.g., speaks in phrases, SOB even at rest, pulse 100-120) AND [2] still present when not coughing  Answer Assessment - Initial Assessment Questions 1. ONSET: When did the cough begin?      A month or two ago 2. SEVERITY: How bad is the cough today?      moderate 3. SPUTUM: Describe the color of your sputum (e.g., none, dry cough; clear, white, yellow, green)     blood 4. HEMOPTYSIS: Are you coughing up any blood? If Yes, ask: How much? (e.g., flecks, streaks, tablespoons, etc.)     streaks 5. DIFFICULTY BREATHING: Are you having difficulty breathing? If Yes, ask: How bad is it? (e.g., mild, moderate, severe)      denies 6. FEVER: Do you have a fever? If Yes, ask: What is your temperature, how was it measured, and when did it start?     no 7. CARDIAC HISTORY: Do you have any history of heart disease? (e.g., heart attack, congestive heart failure)      denies 8. LUNG HISTORY: Do you have any history of lung disease?  (e.g., pulmonary embolus, asthma, emphysema)     denies 9. PE RISK FACTORS: Do you have a history of blood clots? (or: recent  major surgery, recent prolonged travel, bedridden)     denies 10. OTHER SYMPTOMS: Do you have any other symptoms? (e.g., runny nose, wheezing, chest pain)       Runny nose 11. PREGNANCY: Is there any chance you are pregnant? When was your last menstrual period?       na 12. TRAVEL: Have you traveled out of the country in the last month? (e.g., travel history, exposures)       no  Protocols used: Cough - Acute Productive-A-AH

## 2024-06-05 NOTE — Telephone Encounter (Signed)
 noted

## 2024-07-04 ENCOUNTER — Ambulatory Visit: Payer: Self-pay

## 2024-07-04 NOTE — Telephone Encounter (Signed)
 Noted

## 2024-07-04 NOTE — Telephone Encounter (Signed)
 Patient reports swelling to right side of face that started with an area of swelling to the right of eye the size of a quarter per patient. Patient states swelling increased to the right side of face into mouth area. Patient endorses the swelling is noticeable to both herself and husband. +itching and burning. Patient also endorses coughing up blood-yellow sputum with streaks of blood. Patient states generally she is not feeling well. Recommended patient to the ED-patient states she will call for a ride and head to the ED.   FYI Only or Action Required?: FYI only for provider.  Patient was last seen in primary care on 01/18/2024 by [name], NP.  Called Nurse Triage reporting Cough (Coughing up blood/), Constipation, and Cyst.  Symptoms began several days ago.  Interventions attempted: Rest, hydration, or home remedies.  Symptoms are: gradually worsening.  Triage Disposition: Go to ED Now (or PCP Triage), Home Care  Patient/caregiver understands and will follow disposition?: Yes  Copied from CRM [id number]. Topic: Clinical - Red Word Triage >> Jul 04, 2024  8:49 AM Carlatta H wrote: [facility] that prompted transfer to Nurse Triage: Patient is coughing up blood and unable to have a bowel movement Reason for Disposition  MILD constipation  Patient sounds very sick or weak to the triager  Patient sounds very sick or weak to the triager  Answer Assessment - Initial Assessment Questions 1. APPEARANCE of SWELLING: What does it look like?     On the right side of face 2. SIZE: How large is the swelling? (e.g., inches, cm; or compare to size of pinhead, tip of pen, eraser, coin, pea, grape, ping pong ball)      Large area of swelling to right side of face. Growth is the size of a quarter with swelling down into face.  3. LOCATION: Where is the swelling located?     Right side of face 4. ONSET: When did the swelling start?     Two days ago 5. COLOR: What color is it? Is there  more than one color?     No color change 6. PAIN: Is there any pain? If Yes, ask: How bad is the pain? (Scale 1-10; or mild, moderate, severe)       7 out of 10 7. ITCH: Does it itch? If Yes, ask: How bad is the itch?      Yes at times with burning 8. CAUSE: What do you think caused the swelling?     unsure 9 OTHER SYMPTOMS: Do you have any other symptoms? (e.g., fever)     Unsure if she is having a fever.  Answer Assessment - Initial Assessment Questions 1. ONSET: When did the cough begin?      Started three weeks ago 2. SEVERITY: How bad is the cough today? Did the blood appear after a coughing spell?      No coughing today but patient states blood will appear after a coughing spell 3. SPUTUM: Describe the color of your sputum (e.g., none, dry cough; clear, white, yellow, green)     yellow 4. HEMOPTYSIS: How much blood? (e.g., flecks, streaks, tablespoons)     Streaks of blood 5. DIFFICULTY BREATHING: Are you having difficulty breathing? If Yes, ask: How bad is it? (e.g., mild, moderate, severe)      no 6. FEVER: Do you have a fever? If Yes, ask: What is your temperature, how was it measured, and when did it start?     unsure 7. CARDIAC  HISTORY: Do you have any history of heart disease? (e.g., heart attack, congestive heart failure)      no 8. LUNG HISTORY: Do you have any history of lung disease?  (e.g., pulmonary embolus, asthma, emphysema)     no 9. PE RISK FACTORS: Do you have a history of blood clots? Note: Other risk factors include recent major surgery, recent prolonged travel, being bedridden.     no 10. OTHER SYMPTOMS: Do you have any other symptoms? (e.g., runny nose, wheezing, chest pain)       no 12. TRAVEL: Have you traveled out of the country in the last month? (e.g., travel history, exposures)       no  Answer Assessment - Initial Assessment Questions 1. STOOL PATTERN OR FREQUENCY: How often do you have a bowel movement  (BM)?  (Normal range: 3 times a day to every 3 days)  When was your last BM?       Small bowel yesterday-patient reports it was hard. Patient reports having gone two days without a bowel movement before yesterday.  2. STRAINING: Do you have to strain to have a BM?      yes 3. ONSET: When did the constipation begin?     Three days 4. RECTAL PAIN: Does your rectum hurt when the stool comes out? If Yes, ask: Do you have hemorrhoids? How bad is the pain?  (Scale 1-10; or mild, moderate, severe)     no 5. BM COMPOSITION: Are the stools hard?      yes 6. BLOOD ON STOOLS: Has there been any blood on the toilet tissue or on the surface of the BM? If Yes, ask: When was the last time?     no 7. CHRONIC CONSTIPATION: Is this a new problem for you?  If No, ask: How long have you had this problem? (days, weeks, months)      yes 8. CHANGES IN DIET OR HYDRATION: Have there been any recent changes in your diet? How much fluids are you drinking on a daily basis?  How much have you had to drink today?     No changes 9. MEDICINES: Have you been taking any new medicines? Are you taking any narcotic pain medicines? (e.g., Dilaudid , morphine , Percocet, Vicodin)     no 10. LAXATIVES: Have you been using any stool softeners, laxatives, or enemas?  If Yes, ask What are you using, how often, and when was the last time?       Miralax  11. ACTIVITY:  How much walking do you do every day?  Has your activity level decreased in the past week?        Decreased activity level in the last two days-patient reports having not felt well 12. CAUSE: What do you think is causing the constipation?        unsure 13. MEDICAL HISTORY: Do you have a history of hemorrhoids, rectal fissures, rectal surgery, or rectal abscess?         no 14. OTHER SYMPTOMS: Do you have any other symptoms? (e.g., abdomen pain, bloating, fever, vomiting)       Abdominal pain  Protocols used: Skin Lump or  Localized Swelling-A-AH, Coughing Up Blood-A-AH, Constipation-A-AH

## 2024-07-09 ENCOUNTER — Encounter: Payer: Self-pay | Admitting: Pulmonary Disease

## 2024-07-09 ENCOUNTER — Ambulatory Visit: Admitting: Pulmonary Disease

## 2024-08-02 ENCOUNTER — Ambulatory Visit: Payer: Self-pay

## 2024-08-02 NOTE — Telephone Encounter (Signed)
 Noted

## 2024-08-02 NOTE — Telephone Encounter (Signed)
 FYI Only or Action Required?: FYI only for provider: ED advised.  Patient was last seen in primary care on 01/18/2024 by [name], NP.  Called Nurse Triage reporting Abdominal Pain.  Symptoms began 3 days ago.  Interventions attempted: Rest, hydration, or home remedies.  Symptoms are: gradually worsening.  Triage Disposition: Go to ED Now (or PCP Triage)  Patient/caregiver understands and will follow disposition?: Yes      Copied from CRM #[id number]. Topic: Clinical - Red Word Triage >> Aug 02, 2024  4:48 PM China J wrote: [facility] that prompted transfer to Nurse Triage: Patient is having lower abdominal pain along with pink colored urine.      Reason for Disposition  Patient sounds very sick or weak to the triager    Advised ED due to blurred vision  Answer Assessment - Initial Assessment Questions 1. LOCATION: Where does it hurt?      Lower abdomen  2. RADIATION: Does the pain shoot anywhere else? (e.g., chest, back)     No radiation  3. ONSET: When did the pain begin? (e.g., minutes, hours or days ago)      3 days ago  4. SUDDEN: Gradual or sudden onset?     Sudden  5. PATTERN Does the pain come and go, or is it constant?     Intermittent  6. SEVERITY: How bad is the pain?  (e.g., Scale 1-10; mild, moderate, or severe)     7/10 7. RECURRENT SYMPTOM: Have you ever had this type of stomach pain before? If Yes, ask: When was the last time? and What happened that time?      No 8. CAUSE: What do you think is causing the stomach pain? (e.g., gallstones, recent abdominal surgery)     Unsure  9. RELIEVING/AGGRAVATING FACTORS: What makes it better or worse? (e.g., antacids, bending or twisting motion, bowel movement)     No 10. OTHER SYMPTOMS: Do you have any other symptoms? (e.g., back pain, diarrhea, fever, urination pain, vomiting)       Pink on toilet paper after urinating, feels fatigued, intermittent blurred vision  Protocols used:  Abdominal Pain - Female-A-AH

## 2024-08-06 ENCOUNTER — Encounter: Payer: Self-pay | Admitting: Radiology

## 2024-08-14 ENCOUNTER — Ambulatory Visit: Payer: Self-pay

## 2024-08-14 NOTE — Telephone Encounter (Signed)
 FYI Only or Action Required?: Action required by provider: request for appointment.  Patient was last seen in primary care on 01/18/2024 by [name], NP.  Called Nurse Triage reporting Pain.  Symptoms began a week ago.  Interventions attempted: OTC medications: pepto and miralax .  Symptoms are: unchanged.  Triage Disposition: See Physician Within 24 Hours  Patient/caregiver understands and will follow disposition?: Yes

## 2024-08-14 NOTE — Telephone Encounter (Signed)
 Unable to reach patient.  No appointments are available in office.  Advise UC/ED d/t sxs and pain

## 2024-08-14 NOTE — Telephone Encounter (Deleted)
 Copied from CRM [id number]. Topic: Clinical - Red Word Triage >> Aug 14, 2024  1:02 PM [name] wrote: [facility] that prompted transfer to Nurse Triage: spotting, reddish and pinkish. Pain in the ovary area. Left shoulder, unable to move arm. Reason for Disposition  [1] MODERATE pain (e.g., interferes with normal activities) AND [2] pain comes and goes (cramps) AND [3] present > 24 hours  (Exception: Pain with Vomiting or Diarrhea - see that Guideline.)  Answer Assessment - Initial Assessment Questions Ovary pain , cough- yellow color Shoulder- left off and on for a long time, fell last year off the back of the couch, unable to raise arm up fully    1. LOCATION: Where does it hurt?      Left lower side 2. RADIATION: Does the pain shoot anywhere else? (e.g., chest, back)     no 3. ONSET: When did the pain begin? (e.g., minutes, hours or days ago)      Last week  4. SUDDEN: Gradual or sudden onset?     sudden 5. PATTERN Does the pain come and go, or is it constant?     Intermittent, very sharp pain 6. SEVERITY: How bad is the pain?  (e.g., Scale 1-10; mild, moderate, or severe)     9-10 7. RECURRENT SYMPTOM: Have you ever had this type of stomach pain before? If Yes, ask: When was the last time? and What happened that time?      *No Answer* 8. CAUSE: What do you think is causing the stomach pain? (e.g., gallstones, recent abdominal surgery)     *No Answer* 9. RELIEVING/AGGRAVATING FACTORS: What makes it better or worse? (e.g., antacids, bending or twisting motion, bowel movement)     No  10. OTHER SYMPTOMS: Do you have any other symptoms? (e.g., back pain, diarrhea, fever, urination pain, vomiting)       Headache, reddish/pinkish color when she wipes, cough  Protocols used: Abdominal Pain - Female-A-AH

## 2024-08-14 NOTE — Telephone Encounter (Signed)
 PT states that she has been having ovary pain but had a complete hysterectomy. She is having lower abdominal pain 9-10/ out of 10 at times. She states the sharp shooting pain comes and goes but there is a constant pain. She states she also noticed pinkish/reddish color when she wiped. She denies pain radiating anywhere, states it's been over a week. Denies any other symptoms related to that. She state she does also have left shoulder pain which she has had over a year after falling off of the back of a couch. She states that she does not have full range of motion of her arm.   Due to provider scheduler, no appts until December. RN did advise would send a message to the office but if the pain in abdomen comes back and is a 9-10 pt needs to go to the Er. Pt stated understanding. PT also states she has tried miralax  and pepto neither helped and a BM didn't help either.

## 2024-10-25 ENCOUNTER — Ambulatory Visit: Payer: Self-pay

## 2024-10-25 NOTE — Telephone Encounter (Signed)
 FYI Only or Action Required?: FYI only for provider: ED advised.  Patient was last seen in primary care on 01/18/2024 by [name], NP.  Called Nurse Triage reporting Shoulder Pain.  Symptoms began several weeks ago. States that she has developed exertional SOB in the last week  Symptoms are: gradually worsening.  Triage Disposition: Go to ED Now (Notify PCP)  Patient/caregiver understands and will follow disposition?: Yes, will follow disposition  Reason for Triage: left shoulder pain and right hand pain and swelling    Reason for Disposition  [1] Age > 40 AND [2] no obvious cause AND [3] pain even when not moving the arm  (Exception: Pain is clearly made worse by moving arm or bending neck.)  Answer Assessment - Initial Assessment Questions 1. ONSET: When did the pain start?     2 months ago 2. LOCATION: Where is the pain located?     L shoulder, both front and back  3. PAIN: How bad is the pain? (Scale 1-10; or mild, moderate, severe)     8 6. OTHER SYMPTOMS: Do you have any other symptoms? (e.g., neck pain, swelling, rash, fever, numbness, weakness)     Exertional SOB  Also c/o R hand swelling and pain, states she did have surgery about 3 years ago on hand.  Protocols used: Shoulder Pain-A-AH
